# Patient Record
Sex: Female | Born: 1950 | Race: White | Hispanic: No | Marital: Married | Smoking: Former smoker
Health system: Southern US, Community
[De-identification: ages and names within clinical notes are randomized; demographics above are authoritative.]

## PROBLEM LIST (undated history)

## (undated) DIAGNOSIS — Z923 Personal history of irradiation: Secondary | ICD-10-CM

## (undated) DIAGNOSIS — E039 Hypothyroidism, unspecified: Secondary | ICD-10-CM

## (undated) DIAGNOSIS — C801 Malignant (primary) neoplasm, unspecified: Secondary | ICD-10-CM

## (undated) DIAGNOSIS — Z889 Allergy status to unspecified drugs, medicaments and biological substances status: Secondary | ICD-10-CM

## (undated) DIAGNOSIS — K219 Gastro-esophageal reflux disease without esophagitis: Secondary | ICD-10-CM

## (undated) HISTORY — DX: Personal history of irradiation: Z92.3

## (undated) HISTORY — PX: OTHER SURGICAL HISTORY: SHX169

## (undated) HISTORY — PX: COLONOSCOPY: SHX174

---

## 1998-03-14 ENCOUNTER — Other Ambulatory Visit: Admission: RE | Admit: 1998-03-14 | Discharge: 1998-03-14 | Payer: Self-pay | Admitting: Obstetrics and Gynecology

## 1999-10-27 ENCOUNTER — Other Ambulatory Visit: Admission: RE | Admit: 1999-10-27 | Discharge: 1999-10-27 | Payer: Self-pay | Admitting: Obstetrics and Gynecology

## 2000-10-27 ENCOUNTER — Other Ambulatory Visit: Admission: RE | Admit: 2000-10-27 | Discharge: 2000-10-27 | Payer: Self-pay | Admitting: Obstetrics and Gynecology

## 2000-10-31 ENCOUNTER — Encounter (INDEPENDENT_AMBULATORY_CARE_PROVIDER_SITE_OTHER): Payer: Self-pay

## 2000-10-31 ENCOUNTER — Other Ambulatory Visit: Admission: RE | Admit: 2000-10-31 | Discharge: 2000-10-31 | Payer: Self-pay | Admitting: Obstetrics and Gynecology

## 2001-11-14 ENCOUNTER — Other Ambulatory Visit: Admission: RE | Admit: 2001-11-14 | Discharge: 2001-11-14 | Payer: Self-pay | Admitting: Obstetrics and Gynecology

## 2001-11-27 ENCOUNTER — Encounter: Payer: Self-pay | Admitting: Obstetrics and Gynecology

## 2001-11-27 ENCOUNTER — Encounter: Admission: RE | Admit: 2001-11-27 | Discharge: 2001-11-27 | Payer: Self-pay | Admitting: Obstetrics and Gynecology

## 2002-11-26 ENCOUNTER — Other Ambulatory Visit: Admission: RE | Admit: 2002-11-26 | Discharge: 2002-11-26 | Payer: Self-pay | Admitting: Obstetrics and Gynecology

## 2003-02-19 ENCOUNTER — Encounter: Admission: RE | Admit: 2003-02-19 | Discharge: 2003-02-19 | Payer: Self-pay | Admitting: Obstetrics and Gynecology

## 2003-02-19 ENCOUNTER — Encounter: Payer: Self-pay | Admitting: Obstetrics and Gynecology

## 2004-04-13 ENCOUNTER — Encounter: Admission: RE | Admit: 2004-04-13 | Discharge: 2004-04-13 | Payer: Self-pay | Admitting: Pediatrics

## 2004-07-07 ENCOUNTER — Ambulatory Visit: Payer: Self-pay | Admitting: Gastroenterology

## 2004-07-10 ENCOUNTER — Ambulatory Visit: Payer: Self-pay | Admitting: Internal Medicine

## 2004-07-14 ENCOUNTER — Ambulatory Visit (HOSPITAL_COMMUNITY): Admission: RE | Admit: 2004-07-14 | Discharge: 2004-07-14 | Payer: Self-pay | Admitting: Internal Medicine

## 2004-09-22 ENCOUNTER — Ambulatory Visit: Payer: Self-pay | Admitting: Internal Medicine

## 2005-01-01 ENCOUNTER — Ambulatory Visit (HOSPITAL_COMMUNITY): Admission: RE | Admit: 2005-01-01 | Discharge: 2005-01-01 | Payer: Self-pay | Admitting: Pediatrics

## 2005-04-13 ENCOUNTER — Ambulatory Visit: Payer: Self-pay | Admitting: Internal Medicine

## 2005-05-11 ENCOUNTER — Encounter: Admission: RE | Admit: 2005-05-11 | Discharge: 2005-05-11 | Payer: Self-pay | Admitting: Obstetrics and Gynecology

## 2006-05-25 ENCOUNTER — Ambulatory Visit: Payer: Self-pay | Admitting: Internal Medicine

## 2006-07-19 ENCOUNTER — Encounter: Admission: RE | Admit: 2006-07-19 | Discharge: 2006-07-19 | Payer: Self-pay | Admitting: Pediatrics

## 2007-07-21 ENCOUNTER — Encounter: Admission: RE | Admit: 2007-07-21 | Discharge: 2007-07-21 | Payer: Self-pay | Admitting: Obstetrics and Gynecology

## 2009-05-13 ENCOUNTER — Encounter: Admission: RE | Admit: 2009-05-13 | Discharge: 2009-05-13 | Payer: Self-pay | Admitting: Obstetrics and Gynecology

## 2010-08-05 ENCOUNTER — Encounter: Admission: RE | Admit: 2010-08-05 | Discharge: 2010-08-05 | Payer: Self-pay | Admitting: Obstetrics and Gynecology

## 2010-09-27 ENCOUNTER — Encounter: Payer: Self-pay | Admitting: Obstetrics and Gynecology

## 2011-01-22 NOTE — Consult Note (Signed)
[name], [name]               ACCOUNT NO.:  [id number]   MEDICAL RECORD NO.:  [id number]          PATIENT TYPE:   LOCATION:                                 FACILITY:   PHYSICIAN:  Lionel December, M.D.    DATE OF BIRTH:  11-03-1950   DATE OF CONSULTATION:  07/07/2004  DATE OF DISCHARGE:                                   CONSULTATION   REFERRING PHYSICIAN:  Dr. Milford Cage.   REASON FOR CONSULTATION:  Abdominal swelling, dysphagia.   HISTORY OF PRESENT ILLNESS:  [name] Black is a 60 year old lady who presents today  with multiple GI complaints.  She actually was to be triaged for a  colonoscopy back in May 2005;  however, she did not call back in order to  schedule this.  She says that she actually wanted to have an upper endoscopy  done at the same time;  therefore, decided to come in to be seen prior to  scheduling any endoscopies.  She has had multiple GI complaints for most of  her life.  She says at age 27, she was diagnosed with IBS and was prescribed  Donnatal to take before meals.  Symptoms have gradually worsened over the  last few years.  She notes that in her epigastric region she has abdominal  discomfort.  She has a burning-type sensation as well.  When she swallows  solid foods or large pills she feels like they become lodged in her lower  esophageal region.  She has pain upon swallowing hot liquids.  She describes  burning in her esophagus at times, at least two to three times weekly.  These symptoms all seem to be exacerbated at times due to stress.  Denies  any nausea or vomiting.  She also describes abdominal cramping in her lower  abdomen in both the right and left lower quadrants.  She feels like she gets  gas trapped in her colon.  She has relief of these symptoms when she is able  to expel gas or have a bowel movement.  These symptoms are often exacerbated  by eating.  She generally has a bowel movement once daily, but if she misses  one day she starts having these  symptoms.  She often passes small hard  balls.  Denies any melena or rectal bleeding.  She generally has these type  of symptoms once weekly.  Approximately once a month she has severe lower  abdominal pain with excessive gas.  She takes Citrucel approximately once  weekly when she begins getting constipated.  Once a month she takes Senokot  S which cleans her out and she feels better.  She also takes Levsin on a  p.r.n. basis for abdominal bloating and cramps.  This seems to help.  Denies  any melena or rectal bleeding.  She has been treated for possible  diverticulitis in December 2004.  This was based on symptoms.   Earlier this year, she had negative stool culture, C. diff, O&P.  She had a  few WBC's.  This was back in May 2005.   CURRENT MEDICATIONS:  1.  Synthroid  0.1 mg daily.  2.  Multivitamin daily.  3.  Calcium daily.  4.  Vitamin E and vitamin C daily.  5.  Tums p.r.n.  6.  Levsin one sublingual q.4-6h. p.r.n.  7.  Flexeril 10 mg t.i.d. p.r.n.  8.  Oral contraceptive daily.   ALLERGIES:  No known drug allergies.   PAST MEDICAL HISTORY:  1.  IBS.  2.  History of lymphocytic thyroiditis with hypothyroidism, followed by Dr.      Patrecia Pace.  She states that her Synthroid dose has not been changed in      several years.  3.  Bilateral TMJ.  4.  History of hepatitis as a child, and describes it as being food borne,      possibly hepatitis A.   PAST SURGICAL HISTORY:  1.  Uterine fibroids removed along with incidental appendectomy.  2.  Right ovarian cyst removed.  3.  Bilateral knee arthroscopy.   FAMILY HISTORY:  Mother has type 2 diabetes mellitus and history of breast  cancer and MI.  Father died of stroke, had hypertension and dysphagia.  She  has a cousin whose son has Crohn's disease.  Two maternal aunts had colon  cancer in their 39s.   SOCIAL HISTORY:  She is married and has one son.  She is unemployed.  She is  a nonsmoker.  Quit in 1986.  She consumes two to  three glasses of wine or  beer weekly.   REVIEW OF SYSTEMS:  Please see HPI for GI.  GENERAL:  Denies any weight  loss.  CARDIOPULMONARY:  Denies any chest pain or shortness of breath.   PHYSICAL EXAMINATION:  VITAL SIGNS:  Weight 160-1/2, temperature 99.1, blood  pressure 126/90, pulse 84.  GENERAL:  A pleasant, well-developed, well-nourished, Caucasian female in no  acute distress.  SKIN:  Warm and dry, no jaundice.  HEENT:  Pupils equal, round, reactive to light.  Conjunctivae are pink.  Sclerae nonicteric.  Oropharyngeal mucosa moist and pink.  No lesions,  erythema, or exudate.  NECK:  No lymphadenopathy or thyromegaly.  CHEST:  Lungs are clear to auscultation.  CARDIAC:  Regular rate and rhythm.  Normal S1 and S2, no murmurs, rubs, or  gallops.  ABDOMEN:  Positive bowel sounds, soft, nondistended.  She has mild  tenderness with palpation over the xiphoid process.  No rebound tenderness  or guarding, no hepatosplenomegaly or masses.  In the lower abdomen she has  a mild tenderness to deep palpation.  EXTREMITIES:  No edema.   IMPRESSION:  [name] Black is a 60 year old lady who has several chronic  gastrointestinal complaints.  She complains of upper abdominal pain which  she describes as a burning type quality often occurs with eating as well as  brought on by stress.  She describes as being unable to wear tight-fitting  clothing or even a bra because of the pain in this region.  Upon  examination, she has tenderness with palpation of the xiphoid process and  may have inflammation present.  She also describes typical reflux symptoms  along with dysphagia to solid foods and large pills.  Given the  constellation of symptoms, I recommend upper endoscopy.  She may have  an  esophageal stricture, esophagitis;  however, I doubt peptic ulcer disease.  In addition, she complains of abdominal bloating, lower abdominal pain associated with constipation.  I suspect this is due to irritable  bowel  syndrome.  She has never had a colonoscopy and recommend one at this  time.   PLAN:  1.  Colonoscopy and esophagogastroduodenoscopy plus or minus esophageal      dilatation in the near future.  2.  She will continue to use Tums on a p.r.n. basis until our endoscopy.      She may ultimately need to go on a daily proton pump inhibitor.  3.  She will continue Levsin on a p.r.n. basis.  4.  Citrucel one tablespoon daily.  5.  If her upper endoscopy is unremarkable, would consider a trial of      ibuprofen 400 mg t.i.d. for two weeks to see if this helps tenderness      related to her xiphoid process.     Lesl   LL/MEDQ  D:  07/07/2004  T:  07/07/2004  Job:  [id number]   cc:   Dr. Milford Cage   Day Surgery at [facility]

## 2011-01-22 NOTE — Op Note (Signed)
NAMEJAZMAN, REUTER               ACCOUNT NO.:  [id number]   MEDICAL RECORD NO.:  [id number]          PATIENT TYPE:  AMB   LOCATION:  DAY                           FACILITY:  [facility]   PHYSICIAN:  Lionel December, M.D.    DATE OF BIRTH:  [birth year]   DATE OF PROCEDURE:  07/14/2004  DATE OF DISCHARGE:                                 OPERATIVE REPORT   PROCEDURE:  Esophagogastroduodenoscopy with esophageal dilation followed  total colonoscopy.   INDICATIONS:  [name] Black is a 60 year old Caucasian female who has had intermittent  solid food dysphagia dating back to age 89. She also complains of  intermittent heartburn, at least twice a week for which she was using OTC  antacids. She also gives his of IBS. She has lower abdominal pain,  intermittent diarrhea. At times she gets constipated and then she has  difficulty with her bowel movements. She has been treated twice for  diverticulitis, but this has been a clinical diagnosis. She is also 60 years  old and therefore needs to undergo colonoscopy also for screening purposes,  and therefore, she has more than 1 reason to undergo colonoscopy.   Both of the procedures and risks were reviewed with the patient and informed  consent was obtained.   PREOPERATIVE MEDICATIONS:  Cetacaine spray for pharyngeal topical  anesthesia, Demerol 50 mg IV, Versed 14 IV mg in divided doses.   FINDINGS:  Procedure performed in endoscopy suite. The patient's vital signs  and O2 saturations were monitored during the procedure and remained stable.   PROCEDURE #1:  Esophagogastroduodenoscopy. The patient was placed in left  lateral position, and Olympus video scope was passed via oropharynx without  any difficulty into the esophagus.   Esophagus:  Mucosa of the esophagus was normal. A single erosion and  noncritical ring was noted at GE junction. This was at 38 cm from the  incisors.   Stomach:  It was empty and distended very well with insufflation. Folds of  the  proximal stomach were normal. Examination of mucosa at gastric body,  antrum, pyloric channel, as well as angularis, fundus, and cardia were  normal.   Duodenum:  Examination of the bulb and post bulbar duodenum was normal.  Endoscope was withdrawn.   Esophagus was dilated by passing 56-French Maloney dilator to full  insertion. As the dilator was withdrawn, endoscope was passed again, and  esophageal mucosa reexamined. There was a tiny mucosal tear or disruption at  GE junction. Pictures taken for the record, and endoscope was withdrawn, and  the patient prepared for procedure #2.   PROCEDURE #2. Colonoscopy. Rectal examination performed. No abnormality  noted on external or digital exam. Olympus video scope was placed in the  rectum and advanced into sigmoid colon which was very tortuous. Slowly and  carefully, scope was passed further. Using abdominal pressure and different  position, I was able to advance the scope into the cecum which was  identified by appendiceal stump and ileocecal valve. Picture taken for the  record. Preparation was felt to be satisfactory. She has liquid stool here  and there which  was easily suctioned out. As the scope was withdrawn,  colonic mucosa was once again carefully examined and revealed normal  vascular pattern throughout. There were no diverticula noted in any segment.  Rectal mucosa was also normal. The scope was retroflexed to examine  anorectal junction which was unremarkable. The scope was straightened and  withdrawn. The patient tolerated the procedure well.   FINAL DIAGNOSES:  1.  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      Esophagus dilated by passing 56-French Maloney dilator disrupting this      ring.  2.  Normal exam of the stomach, first and second part of the duodenum.  3.  Normal colonoscopy. No diverticula were seen on today's exam. Please      note that tiny diverticula could be missed, though.    RECOMMENDATIONS:  1.  She will continue anti-reflex measures. Will start on Aciphex 20 mg p.o.      q.a.m.  2.  High fiber diet. She will continue Citrucel at 1 tablespoonful daily.  3.  Levbid 1/2 to 1 tablet every morning.  4.  She can take Colace 2 tablets at bedtime if she feels she is      constipated. The patient advised not to take any over-the-counter      laxatives. She will keep stool diary and return for office visit in 8      weeks from now.     Naje   NR/MEDQ  D:  07/14/2004  T:  07/14/2004  Job:  [id number]   cc:   Francoise Schaumann. Halm, D.O.  [address]  [place]  [place] [address]  Fax: [phone]

## 2011-08-03 ENCOUNTER — Other Ambulatory Visit: Payer: Self-pay | Admitting: Pediatrics

## 2011-08-03 DIAGNOSIS — Z1231 Encounter for screening mammogram for malignant neoplasm of breast: Secondary | ICD-10-CM

## 2011-08-10 ENCOUNTER — Ambulatory Visit
Admission: RE | Admit: 2011-08-10 | Discharge: 2011-08-10 | Disposition: A | Discharge status: Home / Self Care | Payer: BC Managed Care – PPO | Source: Ambulatory Visit | Attending: Pediatrics | Admitting: Pediatrics

## 2011-08-10 DIAGNOSIS — Z1231 Encounter for screening mammogram for malignant neoplasm of breast: Secondary | ICD-10-CM

## 2012-06-28 ENCOUNTER — Other Ambulatory Visit (INDEPENDENT_AMBULATORY_CARE_PROVIDER_SITE_OTHER): Payer: Self-pay | Admitting: Internal Medicine

## 2012-07-25 ENCOUNTER — Other Ambulatory Visit: Payer: Self-pay | Admitting: Internal Medicine

## 2012-07-25 DIAGNOSIS — Z1231 Encounter for screening mammogram for malignant neoplasm of breast: Secondary | ICD-10-CM

## 2012-09-21 ENCOUNTER — Ambulatory Visit
Admission: RE | Admit: 2012-09-21 | Discharge: 2012-09-21 | Disposition: A | Discharge status: Home / Self Care | Payer: BC Managed Care – PPO | Source: Ambulatory Visit | Attending: Internal Medicine | Admitting: Internal Medicine

## 2012-09-21 DIAGNOSIS — Z1231 Encounter for screening mammogram for malignant neoplasm of breast: Secondary | ICD-10-CM

## 2012-12-27 ENCOUNTER — Other Ambulatory Visit (INDEPENDENT_AMBULATORY_CARE_PROVIDER_SITE_OTHER): Payer: Self-pay | Admitting: Internal Medicine

## 2013-06-27 ENCOUNTER — Other Ambulatory Visit (INDEPENDENT_AMBULATORY_CARE_PROVIDER_SITE_OTHER): Payer: Self-pay | Admitting: Internal Medicine

## 2013-08-21 ENCOUNTER — Other Ambulatory Visit: Payer: Self-pay

## 2013-08-21 DIAGNOSIS — Z1231 Encounter for screening mammogram for malignant neoplasm of breast: Secondary | ICD-10-CM

## 2013-09-25 ENCOUNTER — Other Ambulatory Visit: Payer: Self-pay

## 2013-09-25 ENCOUNTER — Ambulatory Visit
Admission: RE | Admit: 2013-09-25 | Discharge: 2013-09-25 | Disposition: A | Discharge status: Home / Self Care | Payer: Self-pay | Source: Ambulatory Visit

## 2013-09-25 DIAGNOSIS — Z1231 Encounter for screening mammogram for malignant neoplasm of breast: Secondary | ICD-10-CM

## 2013-11-07 ENCOUNTER — Encounter (INDEPENDENT_AMBULATORY_CARE_PROVIDER_SITE_OTHER): Payer: Self-pay | Admitting: *Deleted

## 2013-11-15 ENCOUNTER — Other Ambulatory Visit (INDEPENDENT_AMBULATORY_CARE_PROVIDER_SITE_OTHER): Payer: Self-pay | Admitting: *Deleted

## 2013-11-15 DIAGNOSIS — Z1211 Encounter for screening for malignant neoplasm of colon: Secondary | ICD-10-CM

## 2013-11-16 ENCOUNTER — Telehealth (INDEPENDENT_AMBULATORY_CARE_PROVIDER_SITE_OTHER): Payer: Self-pay | Admitting: *Deleted

## 2013-11-16 DIAGNOSIS — Z1211 Encounter for screening for malignant neoplasm of colon: Secondary | ICD-10-CM

## 2013-11-16 NOTE — Telephone Encounter (Signed)
Patient needs movi prep 

## 2013-11-19 ENCOUNTER — Encounter (INDEPENDENT_AMBULATORY_CARE_PROVIDER_SITE_OTHER): Payer: Self-pay | Admitting: *Deleted

## 2013-11-19 MED ORDER — PEG-KCL-NACL-NASULF-NA ASC-C 100 G PO SOLR: 1.0000 | Freq: Once | ORAL | Status: DC

## 2013-12-24 ENCOUNTER — Telehealth (INDEPENDENT_AMBULATORY_CARE_PROVIDER_SITE_OTHER): Payer: Self-pay | Admitting: *Deleted

## 2013-12-24 NOTE — Telephone Encounter (Signed)
  Procedure: tcs  Reason/Indication:  screening  Has patient had this procedure before?  Yes, 11 years ago  If so, when, by whom and where?    Is there a family history of colon cancer?  no  Who?  What age when diagnosed?    Is patient diabetic?   no      Does patient have prosthetic heart valve?  no  Do you have a pacemaker?  no  Has patient ever had endocarditis? no  Has patient had joint replacement within last 12 months?  no  Does patient tend to be constipated or take laxatives? no  Is patient on Coumadin, Plavix and/or Aspirin? no  Medications: levothyroxine 112 mcg daily, omeprazole 20 mg daily  Allergies: nkda  Medication Adjustment:   Procedure date & time: 01/09/14

## 2013-12-24 NOTE — Telephone Encounter (Signed)
agree

## 2013-12-26 ENCOUNTER — Encounter (HOSPITAL_COMMUNITY): Payer: Self-pay

## 2014-01-09 ENCOUNTER — Encounter (HOSPITAL_COMMUNITY): Payer: Self-pay | Admitting: *Deleted

## 2014-01-09 ENCOUNTER — Ambulatory Visit (HOSPITAL_COMMUNITY)
Admission: RE | Admit: 2014-01-09 | Discharge: 2014-01-09 | Disposition: A | Discharge status: Home / Self Care | Payer: BC Managed Care – PPO | Source: Ambulatory Visit | Attending: Internal Medicine | Admitting: Internal Medicine

## 2014-01-09 ENCOUNTER — Encounter (HOSPITAL_COMMUNITY)
Admission: RE | Disposition: A | Discharge status: Home / Self Care | Payer: Self-pay | Source: Ambulatory Visit | Attending: Internal Medicine

## 2014-01-09 DIAGNOSIS — Z1211 Encounter for screening for malignant neoplasm of colon: Secondary | ICD-10-CM | POA: Insufficient documentation

## 2014-01-09 DIAGNOSIS — K5730 Diverticulosis of large intestine without perforation or abscess without bleeding: Secondary | ICD-10-CM | POA: Insufficient documentation

## 2014-01-09 DIAGNOSIS — Q2733 Arteriovenous malformation of digestive system vessel: Secondary | ICD-10-CM

## 2014-01-09 DIAGNOSIS — Z87891 Personal history of nicotine dependence: Secondary | ICD-10-CM | POA: Insufficient documentation

## 2014-01-09 DIAGNOSIS — K644 Residual hemorrhoidal skin tags: Secondary | ICD-10-CM | POA: Insufficient documentation

## 2014-01-09 HISTORY — PX: COLONOSCOPY: SHX5424

## 2014-01-09 HISTORY — DX: Allergy status to unspecified drugs, medicaments and biological substances: Z88.9

## 2014-01-09 HISTORY — DX: Gastro-esophageal reflux disease without esophagitis: K21.9

## 2014-01-09 HISTORY — DX: Hypothyroidism, unspecified: E03.9

## 2014-01-09 SURGERY — COLONOSCOPY
Anesthesia: Moderate Sedation

## 2014-01-09 MED ORDER — STERILE WATER FOR IRRIGATION IR SOLN
Status: DC | PRN
  Administered 2014-01-09: 11:00:00

## 2014-01-09 MED ORDER — MIDAZOLAM HCL 5 MG/5ML IJ SOLN
INTRAMUSCULAR | Status: AC
  Filled 2014-01-09: qty 10

## 2014-01-09 MED ORDER — MEPERIDINE HCL 50 MG/ML IJ SOLN
INTRAMUSCULAR | Status: AC
  Filled 2014-01-09: qty 1

## 2014-01-09 MED ORDER — SODIUM CHLORIDE 0.9 % IV SOLN
INTRAVENOUS | Status: DC
  Administered 2014-01-09: 10:00:00 via INTRAVENOUS

## 2014-01-09 MED ORDER — MIDAZOLAM HCL 5 MG/5ML IJ SOLN
INTRAMUSCULAR | Status: DC | PRN
  Administered 2014-01-09 (×2): 2 mg via INTRAVENOUS
  Administered 2014-01-09: 3 mg via INTRAVENOUS
  Administered 2014-01-09: 2 mg via INTRAVENOUS

## 2014-01-09 MED ORDER — MEPERIDINE HCL 50 MG/ML IJ SOLN
INTRAMUSCULAR | Status: DC | PRN
  Administered 2014-01-09 (×2): 25 mg via INTRAVENOUS

## 2014-01-09 NOTE — Op Note (Addendum)
COLONOSCOPY PROCEDURE REPORT  PATIENT:  [name] Black  MR#:  [id number] Birthdate:  01-12-1951, 63 y.o., female Endoscopist:  Dr. Rogene Houston, MD Referred By:  Dr. Wende Neighbors, MD Procedure Date: 01/09/2014  Procedure:   Colonoscopy  Indications:  Patient is 63 year old Caucasian female is undergoing average risk screening colonoscopy.  Informed Consent:  The procedure and risks were reviewed with the patient and informed consent was obtained.  Medications:  Demerol 50 mg IV Versed 9 mg IV  Description of procedure:  After a digital rectal exam was performed, that colonoscope was advanced from the anus through the rectum and colon to the area of the cecum, ileocecal valve and appendiceal orifice. The cecum was deeply intubated. These structures were well-seen and photographed for the record. From the level of the cecum and ileocecal valve, the scope was slowly and cautiously withdrawn. The mucosal surfaces were carefully surveyed utilizing scope tip to flexion to facilitate fold flattening as needed. The scope was pulled down into the rectum where a thorough exam including retroflexion was performed. Procedure was begun with pediatric colonoscope but was changed to slim scope in order to overcome tight turn in sigmoid colon.  Findings:   Prep excellent. Few small diverticula in sigmoid colon. Normal rectal mucosa. Small hemorrhoids below the dentate line.    Therapeutic/Diagnostic Maneuvers Performed:  None  Complications:  None  Cecal Withdrawal Time:  10 minutes  Impression:  Examination performed to cecum. Mild sigmoid colon diverticulosis. 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 Small external hemorrhoids.  Recommendations:  Standard instructions given. High fiber diet. Next screening exam in 10 years.  Rogene Houston  01/09/2014 11:42 AM  CC: Dr. Delphina Cahill, MD & Dr. Rayne Du ref. provider found

## 2014-01-09 NOTE — Discharge Instructions (Addendum)
Resume usual medications and high fiber diet. No driving for 24 hours. Next screening exam in 10 years.   High-Fiber Diet Fiber is found in fruits, vegetables, and grains. A high-fiber diet encourages the addition of more whole grains, legumes, fruits, and vegetables in your diet. The recommended amount of fiber for adult males is 38 g per day. For adult females, it is 25 g per day. Pregnant and lactating women should get 28 g of fiber per day. If you have a digestive or bowel problem, ask your caregiver for advice before adding high-fiber foods to your diet. Eat a variety of high-fiber foods instead of only a select few type of foods.  PURPOSE  To increase stool bulk.  To make bowel movements more regular to prevent constipation.  To lower cholesterol.  To prevent overeating. WHEN IS THIS DIET USED?  It may be used if you have constipation and hemorrhoids.  It may be used if you have uncomplicated diverticulosis (intestine condition) and irritable bowel syndrome.  It may be used if you need help with weight management.  It may be used if you want to add it to your diet as a protective measure against atherosclerosis, diabetes, and cancer. SOURCES OF FIBER  Whole-grain breads and cereals.  Fruits, such as apples, oranges, bananas, berries, prunes, and pears.  Vegetables, such as green peas, carrots, sweet potatoes, beets, broccoli, cabbage, spinach, and artichokes.  Legumes, such split peas, soy, lentils.  Almonds. FIBER CONTENT IN FOODS Starches and Grains / Dietary Fiber (g)  Cheerios, 1 cup / 3 g  Corn Flakes cereal, 1 cup / 0.7 g  Rice crispy treat cereal, 1 cup / 0.3 g  Instant oatmeal (cooked),  cup / 2 g  Frosted wheat cereal, 1 cup / 5.1 g  Brown, long-grain rice (cooked), 1 cup / 3.5 g  White, long-grain rice (cooked), 1 cup / 0.6 g  Enriched macaroni (cooked), 1 cup / 2.5 g Legumes / Dietary Fiber (g)  Baked beans (canned, plain, or vegetarian),   cup / 5.2 g  Kidney beans (canned),  cup / 6.8 g  Pinto beans (cooked),  cup / 5.5 g Breads and Crackers / Dietary Fiber (g)  Plain or honey graham crackers, 2 squares / 0.7 g  Saltine crackers, 3 squares / 0.3 g  Plain, salted pretzels, 10 pieces / 1.8 g  Whole-wheat bread, 1 slice / 1.9 g  White bread, 1 slice / 0.7 g  Raisin bread, 1 slice / 1.2 g  Plain bagel, 3 oz / 2 g  Flour tortilla, 1 oz / 0.9 g  Corn tortilla, 1 small / 1.5 g  Hamburger or hotdog bun, 1 small / 0.9 g Fruits / Dietary Fiber (g)  Apple with skin, 1 medium / 4.4 g  Sweetened applesauce,  cup / 1.5 g  Banana,  medium / 1.5 g  Grapes, 10 grapes / 0.4 g  Orange, 1 small / 2.3 g  Raisin, 1.5 oz / 1.6 g  Melon, 1 cup / 1.4 g Vegetables / Dietary Fiber (g)  Green beans (canned),  cup / 1.3 g  Carrots (cooked),  cup / 2.3 g  Broccoli (cooked),  cup / 2.8 g  Peas (cooked),  cup / 4.4 g  Mashed potatoes,  cup / 1.6 g  Lettuce, 1 cup / 0.5 g  Corn (canned),  cup / 1.6 g  Tomato,  cup / 1.1 g Document Released: 08/23/2005 Document Revised: 02/22/2012 Document Reviewed: 11/25/2011 [employer] Patient  Information 2014 [place], [place]. Colonoscopy, Care After Refer to this sheet in the next few weeks. These instructions provide you with information on caring for yourself after your procedure. Your health care provider may also give you more specific instructions. Your treatment has been planned according to current medical practices, but problems sometimes occur. Call your health care provider if you have any problems or questions after your procedure. WHAT TO EXPECT AFTER THE PROCEDURE  After your procedure, it is typical to have the following:  A small amount of blood in your stool.  Moderate amounts of gas and mild abdominal cramping or bloating. HOME CARE INSTRUCTIONS  Do not drive, operate machinery, or sign important documents for 24 hours.  You may shower and resume your  regular physical activities, but move at a slower pace for the first 24 hours.  Take frequent rest periods for the first 24 hours.  Walk around or put a warm pack on your abdomen to help reduce abdominal cramping and bloating.  Drink enough fluids to keep your urine clear or pale yellow.  You may resume your normal diet as instructed by your health care provider. Avoid heavy or fried foods that are hard to digest.  Avoid drinking alcohol for 24 hours or as instructed by your health care provider.  Only take over-the-counter or prescription medicines as directed by your health care provider.  If a tissue sample (biopsy) was taken during your procedure:  Do not take aspirin or blood thinners for 7 days, or as instructed by your health care provider.  Do not drink alcohol for 7 days, or as instructed by your health care provider.  Eat soft foods for the first 24 hours. SEEK MEDICAL CARE IF: You have persistent spotting of blood in your stool 2 3 days after the procedure. SEEK IMMEDIATE MEDICAL CARE IF:  You have more than a small spotting of blood in your stool.  You pass large blood clots in your stool.  Your abdomen is swollen (distended).  You have nausea or vomiting.  You have a fever.  You have increasing abdominal pain that is not relieved with medicine. Document Released: 04/06/2004 Document Revised: 06/13/2013 Document Reviewed: 04/30/2013 [facility] Patient Information 2014 [place]. Diverticulosis Diverticulosis is a common condition that develops when small pouches (diverticula) form in the wall of the colon. The risk of diverticulosis increases with age. It happens more often in people who eat a low-fiber diet. Most individuals with diverticulosis have no symptoms. Those individuals with symptoms usually experience abdominal pain, constipation, or loose stools (diarrhea). HOME CARE INSTRUCTIONS   Increase the amount of fiber in your diet as directed by your  caregiver or dietician. This may reduce symptoms of diverticulosis.  Your caregiver may recommend taking a dietary fiber supplement.  Drink at least 6 to 8 glasses of water each day to prevent constipation.  Try not to strain when you have a bowel movement.  Your caregiver may recommend avoiding nuts and seeds to prevent complications, although this is still an uncertain benefit.  Only take over-the-counter or prescription medicines for pain, discomfort, or fever as directed by your caregiver. FOODS WITH HIGH FIBER CONTENT INCLUDE:  Fruits. Apple, peach, pear, tangerine, raisins, prunes.  Vegetables. Brussels sprouts, asparagus, broccoli, cabbage, carrot, cauliflower, romaine lettuce, spinach, summer squash, tomato, winter squash, zucchini.  Starchy Vegetables. Baked beans, kidney beans, lima beans, split peas, lentils, potatoes (with skin).  Grains. Whole wheat bread, brown rice, bran flake cereal, plain oatmeal, white rice, shredded  wheat, bran muffins. SEEK IMMEDIATE MEDICAL CARE IF:   You develop increasing pain or severe bloating.  You have an oral temperature above 102 F (38.9 C), not controlled by medicine.  You develop vomiting or bowel movements that are bloody or black. Document Released: 05/20/2004 Document Revised: 11/15/2011 Document Reviewed: 01/21/2010 [facility] Patient Information 2014 [place].

## 2014-01-09 NOTE — H&P (Signed)
[name] Black is an 63 y.o. female.   Chief Complaint: Patient is here for colonoscopy. HPI: This 63 year old Caucasian female who is screening colonoscopy. She denies abdominal pain rectal bleeding or change in bowel habits. Her last colonoscopy was in 2005. Family history is negative for CRC.  Past Medical History  Diagnosis Date  . Hypothyroidism   . H/O seasonal allergies   . GERD (gastroesophageal reflux disease)     Past Surgical History  Procedure Laterality Date  . Colonoscopy    . Right knee arthroscopy    . Fibroid removed and cyst removed from ovary    . Cyst removed from left knee      Family History  Problem Relation Age of Onset  . Colon cancer Neg Hx    Social History:  reports that she has quit smoking. Her smoking use included Cigarettes. She has a 5 pack-year smoking history. She does not have any smokeless tobacco history on file. She reports that she drinks about 4.2 ounces of alcohol per week. She reports that she does not use illicit drugs.  Allergies: No Known Allergies  Medications Prior to Admission  Medication Sig Dispense Refill  . b complex vitamins tablet Take 1 tablet by mouth daily.      Nyoka Cowden Tea, Camillia sinensis, (GREEN TEA PO) Take 1 capsule by mouth daily.      Marland Kitchen levothyroxine (SYNTHROID, LEVOTHROID) 112 MCG tablet Take 112 mcg by mouth daily before breakfast.      . Multiple Vitamins-Minerals (MULTIVITAMINS THER. W/MINERALS) TABS tablet Take 1 tablet by mouth daily.      Marland Kitchen omeprazole (PRILOSEC) 20 MG capsule TAKE ONE (1) CAPSULE EACH DAY  30 capsule  5  . Probiotic Product (PROBIOTIC PO) Take 1 capsule by mouth daily.      Marland Kitchen ibuprofen (ADVIL,MOTRIN) 200 MG tablet Take 200-400 mg by mouth daily as needed for mild pain.      Marland Kitchen loratadine (CLARITIN) 10 MG tablet Take 10 mg by mouth daily as needed for allergies.        No results found for this or any previous visit (from the past 48 hour(s)). No results found.  ROS  Blood pressure  150/86, pulse 75, temperature 97.9 F (36.6 C), temperature source Oral, resp. rate 11, height 5' 6.5" (1.689 m), weight 158 lb (71.668 kg), SpO2 97.00%. Physical Exam  Constitutional: She appears well-developed and well-nourished.  HENT:  Mouth/Throat: Oropharynx is clear and moist.  Eyes: Conjunctivae are normal. No scleral icterus.  Neck: No thyromegaly present.  Cardiovascular: Normal rate, regular rhythm and normal heart sounds.   No murmur heard. Respiratory: Effort normal and breath sounds normal.  GI: Soft. She exhibits no distension and no mass. There is no tenderness.  Musculoskeletal: She exhibits no edema.  Lymphadenopathy:    She has no cervical adenopathy.  Neurological: She is alert.  Skin: Skin is warm and dry.     Assessment/Plan Average risk screening colonoscopy.  [name] Black U [name] Black 01/09/2014, 10:56 AM

## 2014-01-10 ENCOUNTER — Encounter (HOSPITAL_COMMUNITY): Payer: Self-pay | Admitting: Internal Medicine

## 2014-06-24 ENCOUNTER — Telehealth (INDEPENDENT_AMBULATORY_CARE_PROVIDER_SITE_OTHER): Payer: Self-pay | Admitting: *Deleted

## 2014-06-24 ENCOUNTER — Other Ambulatory Visit (INDEPENDENT_AMBULATORY_CARE_PROVIDER_SITE_OTHER): Payer: Self-pay | Admitting: Internal Medicine

## 2014-06-24 ENCOUNTER — Other Ambulatory Visit (INDEPENDENT_AMBULATORY_CARE_PROVIDER_SITE_OTHER): Payer: Self-pay | Admitting: *Deleted

## 2014-06-24 DIAGNOSIS — R1010 Upper abdominal pain, unspecified: Secondary | ICD-10-CM

## 2014-06-24 MED ORDER — HYOSCYAMINE SULFATE 0.125 MG SL SUBL: 0.1250 mg | SUBLINGUAL_TABLET | Freq: Four times a day (QID) | SUBLINGUAL | Status: DC | PRN

## 2014-06-24 NOTE — Telephone Encounter (Signed)
Patient states the following. She has been having Upper Right Quadrant Pain for several months. Thursday of last week she started having burning,GI tract. States that it was horrible. Patient also experiences nausea. Thursday through Sunday she experienced this along with URQ pain , and experienced white BM's  Sunday evening she did begin to feel better.Stools have returned to normal color today.  She denies eating fried foods or Red meats. She did drink more red wine that normal on last Thursday after a very stressful day.  Patient is very concerned, when she was advised that medication had been called in she expressed that she did not was to mask anything going on. She is concerned about Gallbladder/Celiac Disease /Liver Cancer.  This was further discussed with Dr.Rehman , he ask that the patient be brought in on Terri's Schedule this week.

## 2014-06-24 NOTE — Telephone Encounter (Signed)
Per Dr.Rehman the patient may try Levsin 0.125 mg Take 1 by mouth four times daily , PRN. #20 If patient continues to have problems she will need a office visit.

## 2014-06-24 NOTE — Telephone Encounter (Signed)
Per Dr.Rehman patient may try the Levsin.If symptoms persist patient will need to have OV with Terri or him.

## 2014-06-24 NOTE — Telephone Encounter (Signed)
Patient to be called. Prescription was sent to [facility].

## 2014-06-24 NOTE — Telephone Encounter (Signed)
[name] Black started having: Friday-burning in her stomach with abd paim in upper right side; Saturday-the same with sold white bowel movements; Sunday-same with white bowel movements; Today same pain and burning with no bowel movement at this time. Her return phone number is [phone].

## 2014-06-24 NOTE — Telephone Encounter (Signed)
Apt has been scheduled for 06/25/14 with Deberah Castle, NP.

## 2014-06-25 ENCOUNTER — Encounter (INDEPENDENT_AMBULATORY_CARE_PROVIDER_SITE_OTHER): Payer: Self-pay | Admitting: Internal Medicine

## 2014-06-25 ENCOUNTER — Ambulatory Visit (INDEPENDENT_AMBULATORY_CARE_PROVIDER_SITE_OTHER): Payer: BC Managed Care – PPO | Admitting: Internal Medicine

## 2014-06-25 ENCOUNTER — Encounter (INDEPENDENT_AMBULATORY_CARE_PROVIDER_SITE_OTHER): Payer: Self-pay | Admitting: *Deleted

## 2014-06-25 VITALS — BP 126/84 | HR 72 | Temp 98.0°F | Ht 66.5 in | Wt 159.3 lb

## 2014-06-25 DIAGNOSIS — G8929 Other chronic pain: Secondary | ICD-10-CM

## 2014-06-25 DIAGNOSIS — R1011 Right upper quadrant pain: Principal | ICD-10-CM

## 2014-06-25 DIAGNOSIS — E039 Hypothyroidism, unspecified: Secondary | ICD-10-CM | POA: Insufficient documentation

## 2014-06-25 DIAGNOSIS — R1010 Upper abdominal pain, unspecified: Secondary | ICD-10-CM

## 2014-06-25 NOTE — Patient Instructions (Signed)
CBC, CMET, US abdomen

## 2014-06-25 NOTE — Progress Notes (Addendum)
Subjective:    Patient ID: [name] Black, female    DOB: [birth year], 63 y.o.   MRN: [id number]  HPI Presents today with  C/o RUQ pain off and on for months. This episode started last Friday and has not stopped.  Friday morning she had abdominal burning and pain RUQ. She rated the pain at a 6. The pain now is a 2.   No burning in her epigastric area now.  Belching associated with her symptoms.  Not related to eating.  On Thursday before she had the pain she had 3 glasses of wine, but no fried foods. When she presses on her rt upper quadrant it is tender. She tells me on Saturday and Sunday she had a white stool. Appetite is good. No weight loss. No acid reflux. Controlled with Prilosec. Usually ha a BM x 1 a day.  She takes Fiber every night before bedtime and 4 prunes. She says she does not have a hx of constipation. If she eats a large meal she will become constipated and she will have to take Senna. Hx of Hepatitis as a child.  01/09/2014 Colonoscopy Average screening, Dr. Laural Golden: Impression:  Examination performed to cecum.  Mild sigmoid colon diverticulosis.  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  Small external hemorrhoids.  07/14/2004 EGD/ED, Colonoscopy: 1. 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  Esophagus dilated by passing 56-French Maloney dilator disrupting this  ring.  2. Normal exam of the stomach, first and second part of the duodenum.  3. Normal colonoscopy. No diverticula were seen on today's exam. Please  note that tiny diverticula could be missed, though    Review of Systems Past Medical History  Diagnosis Date  . Hypothyroidism   . H/O seasonal allergies   . GERD (gastroesophageal reflux disease)     Past Surgical History  Procedure Laterality Date  . Colonoscopy    . Right knee arthroscopy    . Fibroid removed and cyst removed from ovary    . Cyst removed from left knee    . Colonoscopy N/A 01/09/2014      Procedure: COLONOSCOPY;  Surgeon: Rogene Houston, MD;  Location: [facility];  Service: Endoscopy;  Laterality: N/A;  1030    No Known Allergies  Current Outpatient Prescriptions on File Prior to Visit  Medication Sig Dispense Refill  . b complex vitamins tablet Take 1 tablet by mouth daily.      Nyoka Cowden Tea, Camillia sinensis, (GREEN TEA PO) Take 1 capsule by mouth daily.      Marland Kitchen ibuprofen (ADVIL,MOTRIN) 200 MG tablet Take 200-400 mg by mouth daily as needed for mild pain.      Marland Kitchen levothyroxine (SYNTHROID, LEVOTHROID) 112 MCG tablet Take 112 mcg by mouth daily before breakfast.      . Multiple Vitamins-Minerals (MULTIVITAMINS THER. W/MINERALS) TABS tablet Take 1 tablet by mouth daily.      Marland Kitchen omeprazole (PRILOSEC) 20 MG capsule TAKE ONE (1) CAPSULE EACH DAY  30 capsule  5  . Probiotic Product (PROBIOTIC PO) Take 1 capsule by mouth daily.      . hyoscyamine (LEVSIN SL) 0.125 MG SL tablet Place 1 tablet (0.125 mg total) under the tongue 4 (four) times daily as needed for cramping.  20 tablet  0  . loratadine (CLARITIN) 10 MG tablet Take 10 mg by mouth daily as needed for allergies.       No current facility-administered medications on file prior to  visit.        Objective:   Physical Exam  Filed Vitals:   06/25/14 1043  BP: 126/84  Pulse: 72  Temp: 98 F (36.7 C)  Height: 5' 6.5" (1.689 m)  Weight: 159 lb 4.8 oz (72.258 kg)   Alert and oriented. Skin warm and dry. Oral mucosa is moist.   . Sclera anicteric, conjunctivae is pink. Thyroid not enlarged. No cervical lymphadenopathy. Lungs clear. Heart regular rate and rhythm.  Abdomen is soft. Bowel sounds are positive. No hepatomegaly. No abdominal masses felt. No tenderness.  No edema to lower extremities.          Assessment & Plan:  RUQ pain. GB disease needs to be ruled out.

## 2014-06-26 LAB — CBC WITH DIFFERENTIAL/PLATELET
Basophils Absolute: 0 10*3/uL (ref 0.0–0.1)
Basophils Relative: 0 % (ref 0–1)
EOS PCT: 1 % (ref 0–5)
Eosinophils Absolute: 0.1 10*3/uL (ref 0.0–0.7)
HEMATOCRIT: 44.5 % (ref 36.0–46.0)
HEMOGLOBIN: 14.9 g/dL (ref 12.0–15.0)
LYMPHS PCT: 28 % (ref 12–46)
Lymphs Abs: 2 10*3/uL (ref 0.7–4.0)
MCH: 31.3 pg (ref 26.0–34.0)
MCHC: 33.5 g/dL (ref 30.0–36.0)
MCV: 93.5 fL (ref 78.0–100.0)
MONO ABS: 0.4 10*3/uL (ref 0.1–1.0)
MONOS PCT: 6 % (ref 3–12)
Neutro Abs: 4.6 10*3/uL (ref 1.7–7.7)
Neutrophils Relative %: 65 % (ref 43–77)
Platelets: 301 10*3/uL (ref 150–400)
RBC: 4.76 MIL/uL (ref 3.87–5.11)
RDW: 13.5 % (ref 11.5–15.5)
WBC: 7 10*3/uL (ref 4.0–10.5)

## 2014-06-26 LAB — COMPREHENSIVE METABOLIC PANEL
ALT: 23 U/L (ref 0–35)
AST: 17 U/L (ref 0–37)
Albumin: 4.4 g/dL (ref 3.5–5.2)
Alkaline Phosphatase: 74 U/L (ref 39–117)
BUN: 13 mg/dL (ref 6–23)
CO2: 20 meq/L (ref 19–32)
CREATININE: 0.83 mg/dL (ref 0.50–1.10)
Calcium: 9.7 mg/dL (ref 8.4–10.5)
Chloride: 104 mEq/L (ref 96–112)
Glucose, Bld: 87 mg/dL (ref 70–99)
Potassium: 4.9 mEq/L (ref 3.5–5.3)
Sodium: 140 mEq/L (ref 135–145)
Total Bilirubin: 0.5 mg/dL (ref 0.2–1.2)
Total Protein: 7.2 g/dL (ref 6.0–8.3)

## 2014-06-27 ENCOUNTER — Ambulatory Visit (HOSPITAL_COMMUNITY)
Admission: RE | Admit: 2014-06-27 | Discharge: 2014-06-27 | Disposition: A | Discharge status: Home / Self Care | Payer: BC Managed Care – PPO | Source: Ambulatory Visit | Attending: Internal Medicine | Admitting: Internal Medicine

## 2014-06-27 ENCOUNTER — Telehealth (INDEPENDENT_AMBULATORY_CARE_PROVIDER_SITE_OTHER): Payer: Self-pay | Admitting: Internal Medicine

## 2014-06-27 DIAGNOSIS — R1011 Right upper quadrant pain: Secondary | ICD-10-CM

## 2014-06-27 DIAGNOSIS — R110 Nausea: Secondary | ICD-10-CM | POA: Insufficient documentation

## 2014-06-27 DIAGNOSIS — G8929 Other chronic pain: Secondary | ICD-10-CM

## 2014-06-27 NOTE — Telephone Encounter (Signed)
HIDA scan will be scheduled.

## 2014-06-28 NOTE — Telephone Encounter (Signed)
HIDA scan sch'd 07/02/14 at 800 (745 am). Npo after midnight, no pain meds, left detailed message for patient

## 2014-07-02 ENCOUNTER — Other Ambulatory Visit (HOSPITAL_COMMUNITY): Payer: BC Managed Care – PPO

## 2014-07-10 ENCOUNTER — Encounter (HOSPITAL_COMMUNITY)
Admission: RE | Admit: 2014-07-10 | Discharge: 2014-07-10 | Disposition: A | Discharge status: Home / Self Care | Payer: BC Managed Care – PPO | Source: Ambulatory Visit | Attending: Diagnostic Radiology | Admitting: Diagnostic Radiology

## 2014-07-10 ENCOUNTER — Encounter (HOSPITAL_COMMUNITY): Payer: Self-pay

## 2014-07-10 DIAGNOSIS — R110 Nausea: Secondary | ICD-10-CM | POA: Diagnosis not present

## 2014-07-10 DIAGNOSIS — R1011 Right upper quadrant pain: Secondary | ICD-10-CM | POA: Insufficient documentation

## 2014-07-10 MED ORDER — SINCALIDE 5 MCG IJ SOLR
INTRAMUSCULAR | Status: AC
  Administered 2014-07-10: 1.41 ug via INTRAVENOUS
  Filled 2014-07-10: qty 5

## 2014-07-10 MED ORDER — SODIUM CHLORIDE 0.9 % IJ SOLN
INTRAMUSCULAR | Status: AC
  Filled 2014-07-10: qty 12

## 2014-07-10 MED ORDER — TECHNETIUM TC 99M MEBROFENIN IV KIT
5.0000 | PACK | Freq: Once | INTRAVENOUS | Status: AC | PRN
  Administered 2014-07-10: 5 via INTRAVENOUS

## 2014-07-10 MED ORDER — STERILE WATER FOR INJECTION IJ SOLN
INTRAMUSCULAR | Status: AC
  Administered 2014-07-10: 1.41 mL via INTRAVENOUS
  Filled 2014-07-10: qty 10

## 2014-08-28 ENCOUNTER — Other Ambulatory Visit: Payer: Self-pay

## 2014-08-28 DIAGNOSIS — Z1231 Encounter for screening mammogram for malignant neoplasm of breast: Secondary | ICD-10-CM

## 2014-10-16 ENCOUNTER — Ambulatory Visit: Payer: BC Managed Care – PPO

## 2014-10-22 ENCOUNTER — Ambulatory Visit
Admission: RE | Admit: 2014-10-22 | Discharge: 2014-10-22 | Disposition: A | Discharge status: Home / Self Care | Payer: BLUE CROSS/BLUE SHIELD | Source: Ambulatory Visit

## 2014-10-22 DIAGNOSIS — Z1231 Encounter for screening mammogram for malignant neoplasm of breast: Secondary | ICD-10-CM

## 2015-07-11 ENCOUNTER — Encounter: Payer: Self-pay | Admitting: "Endocrinology

## 2015-07-11 ENCOUNTER — Ambulatory Visit (INDEPENDENT_AMBULATORY_CARE_PROVIDER_SITE_OTHER): Payer: BLUE CROSS/BLUE SHIELD | Admitting: "Endocrinology

## 2015-07-11 VITALS — BP 143/72 | HR 91 | Ht 66.0 in | Wt 162.0 lb

## 2015-07-11 DIAGNOSIS — R739 Hyperglycemia, unspecified: Secondary | ICD-10-CM

## 2015-07-11 DIAGNOSIS — E039 Hypothyroidism, unspecified: Secondary | ICD-10-CM

## 2015-07-11 DIAGNOSIS — R7303 Prediabetes: Secondary | ICD-10-CM | POA: Insufficient documentation

## 2015-07-11 MED ORDER — LEVOTHYROXINE SODIUM 112 MCG PO TABS: 112.0000 ug | ORAL_TABLET | Freq: Every day | ORAL | Status: DC

## 2015-07-12 NOTE — Progress Notes (Signed)
Subjective:    Patient ID: [name] Black, female    DOB: July 30, 1951,    Past Medical History  Diagnosis Date  . Hypothyroidism   . H/O seasonal allergies   . GERD (gastroesophageal reflux disease)    Past Surgical History  Procedure Laterality Date  . Colonoscopy    . Right knee arthroscopy    . Fibroid removed and cyst removed from ovary    . Cyst removed from left knee    . Colonoscopy N/A 01/09/2014    Procedure: COLONOSCOPY;  Surgeon: Rogene Houston, MD;  Location: [facility];  Service: Endoscopy;  Laterality: N/A;  1030   Social History   Social History  . Marital Status: Married    Spouse Name: N/A  . Number of Children: N/A  . Years of Education: N/A   Social History Main Topics  . Smoking status: Former Smoker -- 0.50 packs/day for 10 years    Types: Cigarettes  . Smokeless tobacco: None  . Alcohol Use: 4.2 oz/week    7 Glasses of wine per week     Comment: 1-2 glasses of wine a night  . Drug Use: No  . Sexual Activity: Not Asked   Other Topics Concern  . None   Social History Narrative   Outpatient Encounter Prescriptions as of 07/11/2015  Medication Sig  . b complex vitamins tablet Take 1 tablet by mouth daily.  . calcium citrate-vitamin D (CITRACAL+D) 315-200 MG-UNIT per tablet Take 1 tablet by mouth 2 (two) times daily.  Nyoka Cowden Tea, Camillia sinensis, (GREEN TEA PO) Take 1 capsule by mouth daily.  Marland Kitchen levothyroxine (SYNTHROID, LEVOTHROID) 112 MCG tablet Take 1 tablet (112 mcg total) by mouth daily before breakfast.  . Multiple Vitamins-Minerals (MULTIVITAMINS THER. W/MINERALS) TABS tablet Take 1 tablet by mouth daily.  . Omega-3 Fatty Acids (FISH OIL) 1000 MG CAPS Take by mouth.  Marland Kitchen omeprazole (PRILOSEC) 20 MG capsule TAKE ONE (1) CAPSULE EACH DAY  . Probiotic Product (PROBIOTIC PO) Take 1 capsule by mouth daily.  . [DISCONTINUED] levothyroxine (SYNTHROID, LEVOTHROID) 112 MCG tablet Take 112 mcg by mouth daily before breakfast.  . Green Tea 150  MG CAPS Take by mouth.  . hyoscyamine (LEVSIN SL) 0.125 MG SL tablet Place 1 tablet (0.125 mg total) under the tongue 4 (four) times daily as needed for cramping.  Marland Kitchen ibuprofen (ADVIL,MOTRIN) 200 MG tablet Take 200-400 mg by mouth daily as needed for mild pain.  Marland Kitchen loratadine (CLARITIN) 10 MG tablet Take 10 mg by mouth daily as needed for allergies.   No facility-administered encounter medications on file as of 07/11/2015.   ALLERGIES: No Known Allergies VACCINATION STATUS:  There is no immunization history on file for this patient.  HPI 64 yr old female with long-standing primary hypothyroidism on levothyroxine 112 g by mouth every morning. She is here for f/u. She feels better. No cold /heat intolerance.  She has a steady weight. No new complaints.  Review of Systems  Constitutional: no weight gain/loss, no fatigue, no subjective hyperthermia/hypothermia Eyes: no blurry vision, no xerophthalmia ENT: no sore throat, no nodules palpated in throat, no dysphagia/odynophagia, no hoarseness Cardiovascular: no CP/SOB/palpitations/leg swelling Respiratory: no cough/SOB Gastrointestinal: no N/V/D/C Musculoskeletal: no muscle/joint aches Skin: no rashes Neurological: no tremors/numbness/tingling/dizziness Psychiatric: no depression/anxiety  Objective:    BP 143/72 mmHg  Pulse 91  Ht 5' 6"  (1.676 m)  Wt 162 lb (73.483 kg)  BMI 26.16 kg/m2  SpO2 97%  Wt Readings from Last  3 Encounters:  07/11/15 162 lb (73.483 kg)  06/25/14 159 lb 4.8 oz (72.258 kg)  01/09/14 158 lb (71.668 kg)    Physical Exam  Constitutional: in NAD Eyes: PERRLA, EOMI, no exophthalmos ENT: moist mucous membranes, no thyromegaly, no cervical lymphadenopathy Cardiovascular: RRR, No MRG Respiratory: CTA B Gastrointestinal: abdomen soft, NT, ND, BS+ Musculoskeletal: no deformities, strength intact in all 4 Skin: moist, warm, no rashes Neurological: no tremor with outstretched hands, DTR normal in all  4  Results for orders placed or performed in visit on 06/25/14  Comp Met (CMET)  Result Value Ref Range   Sodium 140 135 - 145 mEq/L   Potassium 4.9 3.5 - 5.3 mEq/L   Chloride 104 96 - 112 mEq/L   CO2 20 19 - 32 mEq/L   Glucose, Bld 87 70 - 99 mg/dL   BUN 13 6 - 23 mg/dL   Creat 0.83 0.50 - 1.10 mg/dL   Total Bilirubin 0.5 0.2 - 1.2 mg/dL   Alkaline Phosphatase 74 39 - 117 U/L   AST 17 0 - 37 U/L   ALT 23 0 - 35 U/L   Total Protein 7.2 6.0 - 8.3 g/dL   Albumin 4.4 3.5 - 5.2 g/dL   Calcium 9.7 8.4 - 10.5 mg/dL  CBC with Differential  Result Value Ref Range   WBC 7.0 4.0 - 10.5 K/uL   RBC 4.76 3.87 - 5.11 MIL/uL   Hemoglobin 14.9 12.0 - 15.0 g/dL   HCT 44.5 36.0 - 46.0 %   MCV 93.5 78.0 - 100.0 fL   MCH 31.3 26.0 - 34.0 pg   MCHC 33.5 30.0 - 36.0 g/dL   RDW 13.5 11.5 - 15.5 %   Platelets 301 150 - 400 K/uL   Neutrophils Relative % 65 43 - 77 %   Neutro Abs 4.6 1.7 - 7.7 K/uL   Lymphocytes Relative 28 12 - 46 %   Lymphs Abs 2.0 0.7 - 4.0 K/uL   Monocytes Relative 6 3 - 12 %   Monocytes Absolute 0.4 0.1 - 1.0 K/uL   Eosinophils Relative 1 0 - 5 %   Eosinophils Absolute 0.1 0.0 - 0.7 K/uL   Basophils Relative 0 0 - 1 %   Basophils Absolute 0.0 0.0 - 0.1 K/uL   Smear Review Criteria for review not met    Complete Blood Count (Most recent): Lab Results  Component Value Date   WBC 7.0 06/25/2014   HGB 14.9 06/25/2014   HCT 44.5 06/25/2014   MCV 93.5 06/25/2014   PLT 301 06/25/2014   Chemistry (most recent): Lab Results  Component Value Date   NA 140 06/25/2014   K 4.9 06/25/2014   CL 104 06/25/2014   CO2 20 06/25/2014   BUN 13 06/25/2014   CREATININE 0.83 06/25/2014    Assessment & Plan:   1. Hypothyroidism, unspecified hypothyroidism type she is doing well on Levothyroxine 112 mcg po qam. Will continue same.    - We discussed about correct intake of levothyroxine, at fasting, with water, separated by at least 30 minutes from breakfast, and separated by more  than 4 hours from calcium, iron, multivitamins, acid reflux medications (PPIs). -Patient is made aware of the fact that thyroid hormone replacement is needed for life, dose to be adjusted by periodic monitoring of thyroid function tests. She had subcentimeter nodules will need follow-up ultrasound.  2. Blood glucose elevated Her A1c is better at 5.7% .she is not on any medications. She will continue on  diet and exercise regimen only..  I advised patient to maintain close follow up with their PCP for primary care needs, will return in 6 months with repeat thyroid function test for follow-up.  Follow up plan: Return for underactive thyroid.  Glade Lloyd, MD Phone: [phone]  Fax: [phone]   07/12/2015, 8:24 AM

## 2015-12-11 ENCOUNTER — Other Ambulatory Visit: Payer: Self-pay | Admitting: Internal Medicine

## 2015-12-11 ENCOUNTER — Other Ambulatory Visit: Payer: Self-pay

## 2015-12-11 DIAGNOSIS — Z1231 Encounter for screening mammogram for malignant neoplasm of breast: Secondary | ICD-10-CM

## 2015-12-23 ENCOUNTER — Other Ambulatory Visit: Payer: Self-pay | Admitting: "Endocrinology

## 2015-12-23 LAB — TSH: TSH: 2.06 mIU/L

## 2015-12-23 LAB — T4, FREE: Free T4: 1.2 ng/dL (ref 0.8–1.8)

## 2015-12-30 ENCOUNTER — Encounter: Payer: Self-pay | Admitting: "Endocrinology

## 2015-12-30 ENCOUNTER — Ambulatory Visit (INDEPENDENT_AMBULATORY_CARE_PROVIDER_SITE_OTHER): Payer: BLUE CROSS/BLUE SHIELD | Admitting: "Endocrinology

## 2015-12-30 VITALS — BP 135/86 | HR 82 | Ht 66.0 in | Wt 161.0 lb

## 2015-12-30 DIAGNOSIS — E039 Hypothyroidism, unspecified: Secondary | ICD-10-CM | POA: Diagnosis not present

## 2015-12-30 DIAGNOSIS — R7303 Prediabetes: Secondary | ICD-10-CM | POA: Diagnosis not present

## 2015-12-30 MED ORDER — LEVOTHYROXINE SODIUM 112 MCG PO TABS: 112.0000 ug | ORAL_TABLET | Freq: Every day | ORAL | Status: DC

## 2015-12-30 NOTE — Progress Notes (Signed)
Subjective:    Patient ID: [name] Black, female    DOB: 1951/06/30,    Past Medical History  Diagnosis Date  . Hypothyroidism   . H/O seasonal allergies   . GERD (gastroesophageal reflux disease)    Past Surgical History  Procedure Laterality Date  . Colonoscopy    . Right knee arthroscopy    . Fibroid removed and cyst removed from ovary    . Cyst removed from left knee    . Colonoscopy N/A 01/09/2014    Procedure: COLONOSCOPY;  Surgeon: Rogene Houston, MD;  Location: [facility];  Service: Endoscopy;  Laterality: N/A;  1030   Social History   Social History  . Marital Status: Married    Spouse Name: N/A  . Number of Children: N/A  . Years of Education: N/A   Social History Main Topics  . Smoking status: Former Smoker -- 0.50 packs/day for 10 years    Types: Cigarettes  . Smokeless tobacco: None  . Alcohol Use: 4.2 oz/week    7 Glasses of wine per week     Comment: 1-2 glasses of wine a night  . Drug Use: No  . Sexual Activity: Not Asked   Other Topics Concern  . None   Social History Narrative   Outpatient Encounter Prescriptions as of 12/30/2015  Medication Sig  . b complex vitamins tablet Take 1 tablet by mouth daily.  . calcium citrate-vitamin D (CITRACAL+D) 315-200 MG-UNIT per tablet Take 1 tablet by mouth 2 (two) times daily.  Nyoka Cowden Tea 150 MG CAPS Take by mouth.  Marland Kitchen ibuprofen (ADVIL,MOTRIN) 200 MG tablet Take 200-400 mg by mouth daily as needed for mild pain.  Marland Kitchen levothyroxine (SYNTHROID, LEVOTHROID) 112 MCG tablet Take 1 tablet (112 mcg total) by mouth daily before breakfast.  . loratadine (CLARITIN) 10 MG tablet Take 10 mg by mouth daily as needed for allergies.  . Multiple Vitamins-Minerals (MULTIVITAMINS THER. W/MINERALS) TABS tablet Take 1 tablet by mouth daily.  . Omega-3 Fatty Acids (FISH OIL) 1000 MG CAPS Take by mouth.  Marland Kitchen omeprazole (PRILOSEC) 20 MG capsule TAKE ONE (1) CAPSULE EACH DAY  . Probiotic Product (PROBIOTIC PO) Take 1 capsule by  mouth daily.  . [DISCONTINUED] Green Tea, Camillia sinensis, (GREEN TEA PO) Take 1 capsule by mouth daily.  . [DISCONTINUED] hyoscyamine (LEVSIN SL) 0.125 MG SL tablet Place 1 tablet (0.125 mg total) under the tongue 4 (four) times daily as needed for cramping.  . [DISCONTINUED] levothyroxine (SYNTHROID, LEVOTHROID) 112 MCG tablet Take 1 tablet (112 mcg total) by mouth daily before breakfast.   No facility-administered encounter medications on file as of 12/30/2015.   ALLERGIES: No Known Allergies VACCINATION STATUS:  There is no immunization history on file for this patient.  HPI 65 yr old female with long-standing primary hypothyroidism on levothyroxine 112 g by mouth every morning. She is here for f/u. She feels better. No cold /heat intolerance.  She has a steady weight. No new complaints.  Review of Systems  Constitutional: no weight gain/loss, no fatigue, no subjective hyperthermia/hypothermia Eyes: no blurry vision, no xerophthalmia ENT: no sore throat, no nodules palpated in throat, no dysphagia/odynophagia, no hoarseness Cardiovascular: no CP/SOB/palpitations/leg swelling Respiratory: no cough/SOB Gastrointestinal: no N/V/D/C Musculoskeletal: no muscle/joint aches Skin: no rashes Neurological: no tremors/numbness/tingling/dizziness Psychiatric: no depression/anxiety  Objective:    BP 135/86 mmHg  Pulse 82  Ht 5\' 6"  (1.676 m)  Wt 161 lb (73.029 kg)  BMI 26.00 kg/m2  SpO2 96%  Wt Readings from Last 3 Encounters:  12/30/15 161 lb (73.029 kg)  07/11/15 162 lb (73.483 kg)  06/25/14 159 lb 4.8 oz (72.258 kg)    Physical Exam  Constitutional: in NAD Eyes: PERRLA, EOMI, no exophthalmos ENT: moist mucous membranes, no thyromegaly, no cervical lymphadenopathy Cardiovascular: RRR, No MRG Respiratory: CTA B Gastrointestinal: abdomen soft, NT, ND, BS+ Musculoskeletal: no deformities, strength intact in all 4 Skin: moist, warm, no rashes Neurological: no tremor with  outstretched hands, DTR normal in all 4  Results for orders placed or performed in visit on 12/23/15  TSH  Result Value Ref Range   TSH 2.06 mIU/L  T4, free  Result Value Ref Range   Free T4 1.2 0.8 - 1.8 ng/dL   Complete Blood Count (Most recent): Lab Results  Component Value Date   WBC 7.0 06/25/2014   HGB 14.9 06/25/2014   HCT 44.5 06/25/2014   MCV 93.5 06/25/2014   PLT 301 06/25/2014   Chemistry (most recent): Lab Results  Component Value Date   NA 140 06/25/2014   K 4.9 06/25/2014   CL 104 06/25/2014   CO2 20 06/25/2014   BUN 13 06/25/2014   CREATININE 0.83 06/25/2014    Assessment & Plan:   1. Hypothyroidism, unspecified hypothyroidism type she is doing well on Levothyroxine 112 mcg po qam. Will continue same.    - We discussed about correct intake of levothyroxine, at fasting, with water, separated by at least 30 minutes from breakfast, and separated by more than 4 hours from calcium, iron, multivitamins, acid reflux medications (PPIs). -Patient is made aware of the fact that thyroid hormone replacement is needed for life, dose to be adjusted by periodic monitoring of thyroid function tests. She had subcentimeter nodules will need follow-up ultrasound.  2. Blood glucose elevated Her A1c is better at 5.6% .she is not on any medications. She will continue on diet and exercise regimen only..  I advised patient to maintain close follow up with their PCP for primary care needs, will return in 6 months with repeat thyroid function test for follow-up.  Follow up plan: Return in about 6 months (around 06/30/2016) for underactive thyroid, prediabetes.  Glade Lloyd, MD Phone: [phone]  Fax: [phone]   12/30/2015, 12:02 PM

## 2016-01-06 ENCOUNTER — Encounter: Payer: Self-pay | Admitting: "Endocrinology

## 2016-01-06 ENCOUNTER — Ambulatory Visit
Admission: RE | Admit: 2016-01-06 | Discharge: 2016-01-06 | Disposition: A | Discharge status: Home / Self Care | Payer: BLUE CROSS/BLUE SHIELD | Source: Ambulatory Visit | Attending: Internal Medicine | Admitting: Internal Medicine

## 2016-01-06 DIAGNOSIS — Z1231 Encounter for screening mammogram for malignant neoplasm of breast: Secondary | ICD-10-CM

## 2016-01-07 ENCOUNTER — Ambulatory Visit: Payer: BLUE CROSS/BLUE SHIELD | Admitting: "Endocrinology

## 2016-06-17 ENCOUNTER — Other Ambulatory Visit: Payer: Self-pay | Admitting: "Endocrinology

## 2016-06-18 LAB — T4, FREE: Free T4: 1.4 ng/dL (ref 0.8–1.8)

## 2016-06-18 LAB — TSH: TSH: 3.06 mIU/L

## 2016-06-24 ENCOUNTER — Encounter: Payer: Self-pay | Admitting: "Endocrinology

## 2016-06-24 ENCOUNTER — Ambulatory Visit (INDEPENDENT_AMBULATORY_CARE_PROVIDER_SITE_OTHER): Payer: BLUE CROSS/BLUE SHIELD | Admitting: "Endocrinology

## 2016-06-24 VITALS — BP 138/88 | HR 81 | Ht 66.0 in | Wt 162.0 lb

## 2016-06-24 DIAGNOSIS — E038 Other specified hypothyroidism: Secondary | ICD-10-CM

## 2016-06-24 DIAGNOSIS — R7303 Prediabetes: Secondary | ICD-10-CM

## 2016-06-24 NOTE — Progress Notes (Signed)
Subjective:    Patient ID: [name] Black, female    DOB: 1951/08/05,    Past Medical History:  Diagnosis Date  . GERD (gastroesophageal reflux disease)   . H/O seasonal allergies   . Hypothyroidism    Past Surgical History:  Procedure Laterality Date  . COLONOSCOPY    . COLONOSCOPY N/A 01/09/2014   Procedure: COLONOSCOPY;  Surgeon: Rogene Houston, MD;  Location: [facility];  Service: Endoscopy;  Laterality: N/A;  1030  . Cyst removed from left knee    . Fibroid removed and cyst removed from ovary    . Right knee arthroscopy     Social History   Social History  . Marital status: Married    Spouse name: N/A  . Number of children: N/A  . Years of education: N/A   Social History Main Topics  . Smoking status: Former Smoker    Packs/day: 0.50    Years: 10.00    Types: Cigarettes  . Smokeless tobacco: Never Used  . Alcohol use 4.2 oz/week    7 Glasses of wine per week     Comment: 1-2 glasses of wine a night  . Drug use: No  . Sexual activity: Not Asked   Other Topics Concern  . None   Social History Narrative  . None   Outpatient Encounter Prescriptions as of 06/24/2016  Medication Sig  . b complex vitamins tablet Take 1 tablet by mouth daily.  Nyoka Cowden Tea 150 MG CAPS Take by mouth.  . levothyroxine (SYNTHROID, LEVOTHROID) 112 MCG tablet Take 1 tablet (112 mcg total) by mouth daily before breakfast.  . Multiple Vitamins-Minerals (MULTIVITAMINS THER. W/MINERALS) TABS tablet Take 1 tablet by mouth daily.  Marland Kitchen omeprazole (PRILOSEC) 20 MG capsule TAKE ONE (1) CAPSULE EACH DAY  . Probiotic Product (PROBIOTIC PO) Take 1 capsule by mouth daily.  . [DISCONTINUED] calcium citrate-vitamin D (CITRACAL+D) 315-200 MG-UNIT per tablet Take 1 tablet by mouth 2 (two) times daily.  . [DISCONTINUED] ibuprofen (ADVIL,MOTRIN) 200 MG tablet Take 200-400 mg by mouth daily as needed for mild pain.  . [DISCONTINUED] loratadine (CLARITIN) 10 MG tablet Take 10 mg by mouth daily as  needed for allergies.  . [DISCONTINUED] Omega-3 Fatty Acids (FISH OIL) 1000 MG CAPS Take by mouth.   No facility-administered encounter medications on file as of 06/24/2016.    ALLERGIES: No Known Allergies VACCINATION STATUS:  There is no immunization history on file for this patient.  HPI 65 yr old female with long-standing primary hypothyroidism on levothyroxine 112 g by mouth every morning. She is here for f/u. She feels better. No cold /heat intolerance.  She has a steady weight. No new complaints.  Review of Systems  Constitutional: no weight gain/loss, no fatigue, no subjective hyperthermia/hypothermia Eyes: no blurry vision, no xerophthalmia ENT: no sore throat, no nodules palpated in throat, no dysphagia/odynophagia, no hoarseness Cardiovascular: no CP/SOB/palpitations/leg swelling Respiratory: no cough/SOB Gastrointestinal: no N/V/D/C Musculoskeletal: no muscle/joint aches Skin: no rashes Neurological: no tremors/numbness/tingling/dizziness Psychiatric: no depression/anxiety  Objective:    BP 138/88   Pulse 81   Ht 5\' 6"  (1.676 m)   Wt 162 lb (73.5 kg)   BMI 26.15 kg/m   Wt Readings from Last 3 Encounters:  06/24/16 162 lb (73.5 kg)  12/30/15 161 lb (73 kg)  07/11/15 162 lb (73.5 kg)    Physical Exam  Constitutional: in NAD Eyes: PERRLA, EOMI, no exophthalmos ENT: moist mucous membranes, Palpable thyroid, no cervical lymphadenopathy Cardiovascular: RRR,  No MRG Respiratory: CTA B Gastrointestinal: abdomen soft, NT, ND, BS+ Musculoskeletal: no deformities, strength intact in all 4 Skin: moist, warm, no rashes Neurological: no tremor with outstretched hands, DTR normal in all 4  Results for orders placed or performed in visit on 06/17/16  TSH  Result Value Ref Range   TSH 3.06 mIU/L  T4, free  Result Value Ref Range   Free T4 1.4 0.8 - 1.8 ng/dL   Complete Blood Count (Most recent): Lab Results  Component Value Date   WBC 7.0 06/25/2014   HGB  14.9 06/25/2014   HCT 44.5 06/25/2014   MCV 93.5 06/25/2014   PLT 301 06/25/2014   Chemistry (most recent): Lab Results  Component Value Date   NA 140 06/25/2014   K 4.9 06/25/2014   CL 104 06/25/2014   CO2 20 06/25/2014   BUN 13 06/25/2014   CREATININE 0.83 06/25/2014    Assessment & Plan:   1. Hypothyroidism, unspecified hypothyroidism type she is doing well on Levothyroxine 112 mcg po qam. Will continue same.    - We discussed about correct intake of levothyroxine, at fasting, with water, separated by at least 30 minutes from breakfast, and separated by more than 4 hours from calcium, iron, multivitamins, acid reflux medications (PPIs). -Patient is made aware of the fact that thyroid hormone replacement is needed for life, dose to be adjusted by periodic monitoring of thyroid function tests.  She had subcentimeter nodules will need follow-up ultrasound.  2. Blood glucose elevated Her A1c is better at 5.6% .she is not on any medications. She will continue on diet and exercise regimen only..  I advised patient to maintain close follow up with their PCP for primary care needs, will return in 6 months with repeat thyroid function test for follow-up.  Follow up plan: Return in about 6 months (around 12/23/2016) for follow up with pre-visit labs.  Glade Lloyd, MD Phone: [phone]  Fax: [phone]   06/24/2016, 1:23 PM

## 2016-10-05 ENCOUNTER — Other Ambulatory Visit: Payer: Self-pay | Admitting: "Endocrinology

## 2016-11-25 ENCOUNTER — Other Ambulatory Visit: Payer: Self-pay | Admitting: Internal Medicine

## 2016-11-25 DIAGNOSIS — Z1231 Encounter for screening mammogram for malignant neoplasm of breast: Secondary | ICD-10-CM

## 2016-12-14 ENCOUNTER — Ambulatory Visit (HOSPITAL_COMMUNITY): Admission: RE | Admit: 2016-12-14 | Payer: BLUE CROSS/BLUE SHIELD | Source: Ambulatory Visit

## 2016-12-16 ENCOUNTER — Other Ambulatory Visit: Payer: Self-pay | Admitting: "Endocrinology

## 2016-12-16 LAB — T4, FREE: Free T4: 1.1 ng/dL (ref 0.8–1.8)

## 2016-12-16 LAB — TSH: TSH: 2.67 mIU/L

## 2016-12-17 LAB — HEMOGLOBIN A1C
HEMOGLOBIN A1C: 5.4 % (ref <5.7)
Mean Plasma Glucose: 108 mg/dL

## 2016-12-23 ENCOUNTER — Ambulatory Visit (INDEPENDENT_AMBULATORY_CARE_PROVIDER_SITE_OTHER): Payer: BLUE CROSS/BLUE SHIELD | Admitting: "Endocrinology

## 2016-12-23 ENCOUNTER — Encounter: Payer: Self-pay | Admitting: "Endocrinology

## 2016-12-23 VITALS — BP 138/80 | HR 84 | Ht 66.0 in | Wt 156.0 lb

## 2016-12-23 DIAGNOSIS — E039 Hypothyroidism, unspecified: Secondary | ICD-10-CM

## 2016-12-23 DIAGNOSIS — R7303 Prediabetes: Secondary | ICD-10-CM | POA: Diagnosis not present

## 2016-12-23 MED ORDER — LEVOTHYROXINE SODIUM 112 MCG PO TABS: ORAL_TABLET | ORAL | 4 refills | Status: DC

## 2016-12-23 NOTE — Progress Notes (Signed)
Subjective:    Patient ID: [name] Black, female    DOB: 08-28-1951,    Past Medical History:  Diagnosis Date  . GERD (gastroesophageal reflux disease)   . H/O seasonal allergies   . Hypothyroidism    Past Surgical History:  Procedure Laterality Date  . COLONOSCOPY    . COLONOSCOPY N/A 01/09/2014   Procedure: COLONOSCOPY;  Surgeon: Rogene Houston, MD;  Location: [facility];  Service: Endoscopy;  Laterality: N/A;  1030  . Cyst removed from left knee    . Fibroid removed and cyst removed from ovary    . Right knee arthroscopy     Social History   Social History  . Marital status: Married    Spouse name: N/A  . Number of children: N/A  . Years of education: N/A   Social History Main Topics  . Smoking status: Former Smoker    Packs/day: 0.50    Years: 10.00    Types: Cigarettes  . Smokeless tobacco: Never Used  . Alcohol use 4.2 oz/week    7 Glasses of wine per week     Comment: 1-2 glasses of wine a night  . Drug use: No  . Sexual activity: Not Asked   Other Topics Concern  . None   Social History Narrative  . None   Outpatient Encounter Prescriptions as of 12/23/2016  Medication Sig  . b complex vitamins tablet Take 1 tablet by mouth daily.  Nyoka Cowden Tea 150 MG CAPS Take by mouth.  . levothyroxine (SYNTHROID, LEVOTHROID) 112 MCG tablet TAKE ONE (1) TABLET BY MOUTH EVERY DAY BEFORE BREAKFAST  . Multiple Vitamins-Minerals (MULTIVITAMINS THER. W/MINERALS) TABS tablet Take 1 tablet by mouth daily.  Marland Kitchen omeprazole (PRILOSEC) 20 MG capsule TAKE ONE (1) CAPSULE EACH DAY  . Probiotic Product (PROBIOTIC PO) Take 1 capsule by mouth daily.  . [DISCONTINUED] levothyroxine (SYNTHROID, LEVOTHROID) 112 MCG tablet Take 1 tablet (112 mcg total) by mouth daily before breakfast.  . [DISCONTINUED] levothyroxine (SYNTHROID, LEVOTHROID) 112 MCG tablet TAKE ONE (1) TABLET BY MOUTH EVERY DAY BEFORE BREAKFAST   No facility-administered encounter medications on file as of 12/23/2016.     ALLERGIES: No Known Allergies VACCINATION STATUS:  There is no immunization history on file for this patient.  HPI 66 yr old female with long-standing primary hypothyroidism on levothyroxine 112 g by mouth every morning. She is here for f/u. She feels better. No cold /heat intolerance.  She has lost 5 pounds of weight. No new complaints.  Review of Systems  Constitutional: + Inpatient weight loss of 5 pounds, no fatigue, no subjective hyperthermia/hypothermia Eyes: no blurry vision, no xerophthalmia ENT: no sore throat, no nodules palpated in throat, no dysphagia/odynophagia, no hoarseness Cardiovascular: no CP/SOB/palpitations/leg swelling Respiratory: no cough/SOB Gastrointestinal: no N/V/D/C Musculoskeletal: no muscle/joint aches Skin: no rashes Neurological: no tremors/numbness/tingling/dizziness Psychiatric: no depression/anxiety  Objective:    BP 138/80   Pulse 84   Ht 5\' 6"  (1.676 m)   Wt 156 lb (70.8 kg)   BMI 25.18 kg/m   Wt Readings from Last 3 Encounters:  12/23/16 156 lb (70.8 kg)  06/24/16 162 lb (73.5 kg)  12/30/15 161 lb (73 kg)    Physical Exam  Constitutional: in NAD Eyes: PERRLA, EOMI, no exophthalmos ENT: moist mucous membranes, Palpable thyroid, no cervical lymphadenopathy Cardiovascular: RRR, No MRG Respiratory: CTA B Gastrointestinal: abdomen soft, NT, ND, BS+ Musculoskeletal: no deformities, strength intact in all 4 Skin: moist, warm, no rashes Neurological: no tremor  with outstretched hands, DTR normal in all 4  Results for orders placed or performed in visit on 12/16/16  TSH  Result Value Ref Range   TSH 2.67 mIU/L  T4, free  Result Value Ref Range   Free T4 1.1 0.8 - 1.8 ng/dL  Hemoglobin A1c  Result Value Ref Range   Hgb A1c MFr Bld 5.4 <5.7 %   Mean Plasma Glucose 108 mg/dL   Complete Blood Count (Most recent): Lab Results  Component Value Date   WBC 7.0 06/25/2014   HGB 14.9 06/25/2014   HCT 44.5 06/25/2014   MCV  93.5 06/25/2014   PLT 301 06/25/2014   Chemistry (most recent): Lab Results  Component Value Date   NA 140 06/25/2014   K 4.9 06/25/2014   CL 104 06/25/2014   CO2 20 06/25/2014   BUN 13 06/25/2014   CREATININE 0.83 06/25/2014    Assessment & Plan:   1. Hypothyroidism, unspecified hypothyroidism type she is doing well on Levothyroxine 112 mcg po qam. Will continue same.    - We discussed about correct intake of levothyroxine, at fasting, with water, separated by at least 30 minutes from breakfast, and separated by more than 4 hours from calcium, iron, multivitamins, acid reflux medications (PPIs). -Patient is made aware of the fact that thyroid hormone replacement is needed for life, dose to be adjusted by periodic monitoring of thyroid function tests.  She had subcentimeter nodules will need follow-up ultrasound When she gets appropriate insurance coverage. - She will also need postmenopausal screening bone density for osteoporosis.  2. Predibetes  Her A1c is better at 5.4% .she is not on any medications. She will continue on diet and exercise regimen only.  Follow up plan: Return in about 1 year (around 12/23/2017) for follow up with pre-visit labs, Bone Density.  Glade Lloyd, MD Phone: [phone]  Fax: [phone]   12/23/2016, 10:46 AM

## 2017-01-04 DIAGNOSIS — C801 Malignant (primary) neoplasm, unspecified: Secondary | ICD-10-CM

## 2017-01-04 HISTORY — DX: Malignant (primary) neoplasm, unspecified: C80.1

## 2017-01-04 HISTORY — PX: BREAST BIOPSY: SHX20

## 2017-01-11 ENCOUNTER — Ambulatory Visit
Admission: RE | Admit: 2017-01-11 | Discharge: 2017-01-11 | Disposition: A | Discharge status: Home / Self Care | Payer: BLUE CROSS/BLUE SHIELD | Source: Ambulatory Visit | Attending: Internal Medicine | Admitting: Internal Medicine

## 2017-01-11 DIAGNOSIS — Z1231 Encounter for screening mammogram for malignant neoplasm of breast: Secondary | ICD-10-CM

## 2017-01-12 ENCOUNTER — Other Ambulatory Visit: Payer: Self-pay | Admitting: Internal Medicine

## 2017-01-12 DIAGNOSIS — R928 Other abnormal and inconclusive findings on diagnostic imaging of breast: Secondary | ICD-10-CM

## 2017-01-17 ENCOUNTER — Other Ambulatory Visit: Payer: Self-pay | Admitting: Internal Medicine

## 2017-01-17 ENCOUNTER — Ambulatory Visit
Admission: RE | Admit: 2017-01-17 | Discharge: 2017-01-17 | Disposition: A | Discharge status: Home / Self Care | Payer: BLUE CROSS/BLUE SHIELD | Source: Ambulatory Visit | Attending: Internal Medicine | Admitting: Internal Medicine

## 2017-01-17 DIAGNOSIS — R928 Other abnormal and inconclusive findings on diagnostic imaging of breast: Secondary | ICD-10-CM

## 2017-01-17 DIAGNOSIS — N6310 Unspecified lump in the right breast, unspecified quadrant: Secondary | ICD-10-CM

## 2017-01-19 ENCOUNTER — Other Ambulatory Visit: Payer: Self-pay | Admitting: Internal Medicine

## 2017-01-19 ENCOUNTER — Ambulatory Visit
Admission: RE | Admit: 2017-01-19 | Discharge: 2017-01-19 | Disposition: A | Discharge status: Home / Self Care | Payer: BLUE CROSS/BLUE SHIELD | Source: Ambulatory Visit | Attending: Internal Medicine | Admitting: Internal Medicine

## 2017-01-19 DIAGNOSIS — N6310 Unspecified lump in the right breast, unspecified quadrant: Secondary | ICD-10-CM

## 2017-01-20 ENCOUNTER — Telehealth: Payer: Self-pay | Admitting: *Deleted

## 2017-01-20 NOTE — Telephone Encounter (Signed)
Confirmed [facility] for 01/26/17 at 1215pm. .  Instructions and contact information given.

## 2017-01-25 ENCOUNTER — Other Ambulatory Visit: Payer: Self-pay | Admitting: *Deleted

## 2017-01-25 DIAGNOSIS — Z170 Estrogen receptor positive status [ER+]: Principal | ICD-10-CM

## 2017-01-25 DIAGNOSIS — C50411 Malignant neoplasm of upper-outer quadrant of right female breast: Secondary | ICD-10-CM | POA: Insufficient documentation

## 2017-01-26 ENCOUNTER — Ambulatory Visit
Admission: RE | Admit: 2017-01-26 | Discharge: 2017-01-26 | Disposition: A | Discharge status: Home / Self Care | Payer: BLUE CROSS/BLUE SHIELD | Source: Ambulatory Visit | Attending: Radiation Oncology | Admitting: Radiation Oncology

## 2017-01-26 ENCOUNTER — Ambulatory Visit: Payer: BLUE CROSS/BLUE SHIELD | Attending: General Surgery | Admitting: Physical Therapy

## 2017-01-26 ENCOUNTER — Other Ambulatory Visit (HOSPITAL_BASED_OUTPATIENT_CLINIC_OR_DEPARTMENT_OTHER): Payer: BLUE CROSS/BLUE SHIELD

## 2017-01-26 ENCOUNTER — Encounter: Payer: Self-pay | Admitting: Physical Therapy

## 2017-01-26 ENCOUNTER — Encounter: Payer: Self-pay | Admitting: Radiation Oncology

## 2017-01-26 ENCOUNTER — Ambulatory Visit (HOSPITAL_BASED_OUTPATIENT_CLINIC_OR_DEPARTMENT_OTHER): Payer: BLUE CROSS/BLUE SHIELD | Admitting: Hematology and Oncology

## 2017-01-26 ENCOUNTER — Encounter: Payer: Self-pay | Admitting: Hematology and Oncology

## 2017-01-26 DIAGNOSIS — Z79899 Other long term (current) drug therapy: Secondary | ICD-10-CM | POA: Insufficient documentation

## 2017-01-26 DIAGNOSIS — Z809 Family history of malignant neoplasm, unspecified: Secondary | ICD-10-CM | POA: Insufficient documentation

## 2017-01-26 DIAGNOSIS — Z170 Estrogen receptor positive status [ER+]: Secondary | ICD-10-CM | POA: Insufficient documentation

## 2017-01-26 DIAGNOSIS — R293 Abnormal posture: Secondary | ICD-10-CM | POA: Insufficient documentation

## 2017-01-26 DIAGNOSIS — Z510 Encounter for antineoplastic radiation therapy: Secondary | ICD-10-CM | POA: Insufficient documentation

## 2017-01-26 DIAGNOSIS — Z87891 Personal history of nicotine dependence: Secondary | ICD-10-CM | POA: Diagnosis not present

## 2017-01-26 DIAGNOSIS — C50411 Malignant neoplasm of upper-outer quadrant of right female breast: Secondary | ICD-10-CM | POA: Insufficient documentation

## 2017-01-26 DIAGNOSIS — E039 Hypothyroidism, unspecified: Secondary | ICD-10-CM | POA: Diagnosis not present

## 2017-01-26 DIAGNOSIS — K219 Gastro-esophageal reflux disease without esophagitis: Secondary | ICD-10-CM | POA: Insufficient documentation

## 2017-01-26 LAB — CBC WITH DIFFERENTIAL/PLATELET
BASO%: 0.8 % (ref 0.0–2.0)
Basophils Absolute: 0.1 10*3/uL (ref 0.0–0.1)
EOS%: 1 % (ref 0.0–7.0)
Eosinophils Absolute: 0.1 10*3/uL (ref 0.0–0.5)
HCT: 40.8 % (ref 34.8–46.6)
HGB: 13.7 g/dL (ref 11.6–15.9)
LYMPH%: 26.5 % (ref 14.0–49.7)
MCH: 31.4 pg (ref 25.1–34.0)
MCHC: 33.6 g/dL (ref 31.5–36.0)
MCV: 93.3 fL (ref 79.5–101.0)
MONO#: 0.5 10*3/uL (ref 0.1–0.9)
MONO%: 6.8 % (ref 0.0–14.0)
NEUT%: 64.9 % (ref 38.4–76.8)
NEUTROS ABS: 4.4 10*3/uL (ref 1.5–6.5)
PLATELETS: 259 10*3/uL (ref 145–400)
RBC: 4.38 10*6/uL (ref 3.70–5.45)
RDW: 13.8 % (ref 11.2–14.5)
WBC: 6.7 10*3/uL (ref 3.9–10.3)
lymph#: 1.8 10*3/uL (ref 0.9–3.3)

## 2017-01-26 LAB — COMPREHENSIVE METABOLIC PANEL
ALT: 29 U/L (ref 0–55)
ANION GAP: 7 meq/L (ref 3–11)
AST: 18 U/L (ref 5–34)
Albumin: 4.2 g/dL (ref 3.5–5.0)
Alkaline Phosphatase: 84 U/L (ref 40–150)
BUN: 13.6 mg/dL (ref 7.0–26.0)
CHLORIDE: 104 meq/L (ref 98–109)
CO2: 31 mEq/L — ABNORMAL HIGH (ref 22–29)
Calcium: 9.8 mg/dL (ref 8.4–10.4)
Creatinine: 0.9 mg/dL (ref 0.6–1.1)
EGFR: 66 mL/min/{1.73_m2} — ABNORMAL LOW (ref >90)
Glucose: 77 mg/dl (ref 70–140)
Potassium: 4.8 mEq/L (ref 3.5–5.1)
SODIUM: 142 meq/L (ref 136–145)
TOTAL PROTEIN: 7.6 g/dL (ref 6.4–8.3)
Total Bilirubin: 0.58 mg/dL (ref 0.20–1.20)

## 2017-01-26 NOTE — Patient Instructions (Signed)

## 2017-01-26 NOTE — Progress Notes (Signed)
[facility] CONSULT NOTE  Patient Care Team: Celene Squibb, MD as PCP - General (Internal Medicine) Brien Few, MD as Consulting Physician (Obstetrics and Gynecology) Rolm Bookbinder, MD as Consulting Physician (General Surgery) Nicholas Lose, MD as Consulting Physician (Hematology and Oncology) Gery Pray, MD as Consulting Physician (Radiation Oncology)  CHIEF COMPLAINTS/PURPOSE OF CONSULTATION:  Newly diagnosed breast cancer  HISTORY OF PRESENTING ILLNESS:  [name] Black 66 y.o. female is here because of recent diagnosis of right breast cancer. Patient had a screening mammogram the detected a right breast mass measuring 5 mm in size by ultrasound. Axilla was negative. 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 She was presented this morning in the multidisciplinary tumor board and she is here today to discuss treatment plan.  I reviewed her records extensively and collaborated the history with the patient.  SUMMARY OF ONCOLOGIC HISTORY:   Malignant neoplasm of upper-outer quadrant of right breast in female, estrogen receptor positive ([place])   01/19/2017 Initial Diagnosis    Screening detected right breast mass 5 mm size axilla negative biopsy: Grade 2 invasive ductal carcinoma with DCIS ER 95%, PR 60%, HER-2 negative ratio 1.38, Ki-67 12%, T1aN0 stage IA clinical stage      MEDICAL HISTORY:  Past Medical History:  Diagnosis Date  . GERD (gastroesophageal reflux disease)   . H/O seasonal allergies   . Hypothyroidism     SURGICAL HISTORY: Past Surgical History:  Procedure Laterality Date  . COLONOSCOPY    . COLONOSCOPY N/A 01/09/2014   Procedure: COLONOSCOPY;  Surgeon: Rogene Houston, MD;  Location: [facility];  Service: Endoscopy;  Laterality: N/A;  1030  . Cyst removed from left knee    . Fibroid removed and cyst removed from ovary    . Right knee arthroscopy       SOCIAL HISTORY: Social History   Social History  . Marital status: Married    Spouse name: N/A  . Number of children: N/A  . Years of education: N/A   Occupational History  . Not on file.   Social History Main Topics  . Smoking status: Former Smoker    Packs/day: 0.50    Years: 10.00    Types: Cigarettes  . Smokeless tobacco: Never Used  . Alcohol use 4.2 oz/week    7 Glasses of wine per week     Comment: 1-2 glasses of wine a night  . Drug use: No  . Sexual activity: Not on file   Other Topics Concern  . Not on file   Social History Narrative  . No narrative on file    FAMILY HISTORY: Family History  Problem Relation Age of Onset  . Cancer Mother 72       Breast  . Colon cancer Neg Hx     ALLERGIES:  has No Known Allergies.  MEDICATIONS:  Current Outpatient Prescriptions  Medication Sig Dispense Refill  . diphenhydrAMINE (BENADRYL) 25 MG tablet Take 25 mg by mouth as needed.    Nyoka Cowden Tea 150 MG CAPS Take by mouth.    Marland Kitchen ibuprofen (ADVIL,MOTRIN) 200 MG tablet Take 200 mg by mouth as needed.    Marland Kitchen levothyroxine (SYNTHROID, LEVOTHROID) 112 MCG tablet TAKE ONE (1) TABLET BY MOUTH EVERY DAY BEFORE BREAKFAST 90 tablet 4  . loratadine (CLARITIN) 10 MG tablet Take 10 mg by mouth as needed for allergies.    . Multiple  Vitamins-Minerals (MULTIVITAMINS THER. W/MINERALS) TABS tablet Take 1 tablet by mouth daily.    . Omega-3 Fatty Acids (FISH OIL) 1200 MG CAPS Take 1 capsule by mouth as needed.    Marland Kitchen omeprazole (PRILOSEC) 20 MG capsule TAKE ONE (1) CAPSULE EACH DAY 30 capsule 5  . b complex vitamins tablet Take 1 tablet by mouth daily.    . Probiotic Product (PROBIOTIC PO) Take 1 capsule by mouth daily.     No current facility-administered medications for this visit.     REVIEW OF SYSTEMS:   Constitutional: Denies fevers, chills or abnormal night sweats Eyes: Denies blurriness of vision, double vision or watery eyes Ears, nose, mouth, throat, and face: Denies  mucositis or sore throat Respiratory: Denies cough, dyspnea or wheezes Cardiovascular: Denies palpitation, chest discomfort or lower extremity swelling Gastrointestinal:  Denies nausea, heartburn or change in bowel habits Skin: Denies abnormal skin rashes Lymphatics: Denies new lymphadenopathy or easy bruising Neurological:Denies numbness, tingling or new weaknesses Behavioral/Psych: Mood is stable, no new changes  Breast:  Denies any palpable lumps or discharge All other systems were reviewed with the patient and are negative.  PHYSICAL EXAMINATION: ECOG PERFORMANCE STATUS: 1 - Symptomatic but completely ambulatory  Vitals:   01/26/17 1239  BP: (!) 153/87  Pulse: 83  Resp: 18  Temp: 98.6 F (37 C)   Filed Weights   01/26/17 1239  Weight: 159 lb 9.6 oz (72.4 kg)    GENERAL:alert, no distress and comfortable SKIN: skin color, texture, turgor are normal, no rashes or significant lesions EYES: normal, conjunctiva are pink and non-injected, sclera clear OROPHARYNX:no exudate, no erythema and lips, buccal mucosa, and tongue normal  NECK: supple, thyroid normal size, non-tender, without nodularity LYMPH:  no palpable lymphadenopathy in the cervical, axillary or inguinal LUNGS: clear to auscultation and percussion with normal breathing effort HEART: regular rate & rhythm and no murmurs and no lower extremity edema ABDOMEN:abdomen soft, non-tender and normal bowel sounds Musculoskeletal:no cyanosis of digits and no clubbing  PSYCH: alert & oriented x 3 with fluent speech NEURO: no focal motor/sensory deficits BREAST: No palpable nodules in breast. No palpable axillary or supraclavicular lymphadenopathy (exam performed in the presence of a chaperone)   LABORATORY DATA:  I have reviewed the data as listed Lab Results  Component Value Date   WBC 6.7 01/26/2017   HGB 13.7 01/26/2017   HCT 40.8 01/26/2017   MCV 93.3 01/26/2017   PLT 259 01/26/2017   Lab Results  Component Value  Date   NA 142 01/26/2017   K 4.8 01/26/2017   CL 104 06/25/2014   CO2 31 (H) 01/26/2017    RADIOGRAPHIC STUDIES: I have personally reviewed the radiological reports and agreed with the findings in the report.  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 After reviewing the pathology in detail, we proceeded to discuss the different treatment options between surgery, radiation, chemotherapy, antiestrogen therapies.  Recommendation: 1. Breast conserving surgery with sentinel lymph node biopsy 2. followed by adjuvant radiation 3. Followed by adjuvant antiestrogen therapy  Return to clinic after surgery to discuss the final pathology report   All questions were answered. The patient knows to call the clinic with any problems, questions or concerns.    Rulon Eisenmenger, MD 01/26/17

## 2017-01-26 NOTE — Assessment & Plan Note (Signed)
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 After reviewing the pathology in detail, we proceeded to discuss the different treatment options between surgery, radiation, chemotherapy, antiestrogen therapies.  Recommendation: 1. Breast conserving surgery with sentinel lymph node biopsy 2. followed by adjuvant radiation 3. Followed by adjuvant antiestrogen therapy  Return to clinic after surgery to discuss the final pathology report 

## 2017-01-26 NOTE — Progress Notes (Signed)
Radiation Oncology         [phone] ________________________________  Initial Outpatient Consultation  Name: [name] Black MRN: [id number]  Date: 01/26/2017  DOB: 10/15/1950  VO:ZDGU, Edwinna Areola, MD  Rolm Bookbinder, MD   REFERRING PHYSICIAN: Rolm Bookbinder, MD  DIAGNOSIS: The encounter diagnosis was Malignant neoplasm of upper-outer quadrant of right breast in female, estrogen receptor positive ([place]).  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 female who [facility]  Biopsy of the right breast in the 9:30 o'clock position showed invasive ductal carcinoma and DCIS, grade 2, spanning 0.5 x 0.5 x 0.4 cm.  On review of systems, the patient is positive for night sweats and thyroid problems.   Gynecologic History Age at first menstrual period? 14 Are you still having periods? No  Approximate date of last period? 2005 If you no longer have periods: Have you used hormone replacement? No Obstetric History:  How many children have you carried to term? 1  Your age at first live birth? 78 Pregnant now or trying to get pregnant? No Have you used birth control pills or hormone shots for contraception? Yes If so, for how long (or approximate dates)? 20 years Health Maintenance: Have you ever had a colonoscopy? Yes  If yes, date? 2004 and 2014 Have you ever had a bone density? Yes Date of your last PAP smear? 2017  Date of your FIRST mammogram? 1993   PREVIOUS RADIATION THERAPY: No  PAST MEDICAL HISTORY:  has a past medical history of GERD (gastroesophageal reflux disease); H/O seasonal allergies; and Hypothyroidism.    PAST SURGICAL HISTORY: Past Surgical History:  Procedure Laterality Date  . COLONOSCOPY    . COLONOSCOPY N/A 01/09/2014   Procedure: COLONOSCOPY;  Surgeon: Rogene Houston, MD;   [facility];  Service: Endoscopy;  Laterality: N/A;  1030  . Cyst removed from left knee    . Fibroid removed and cyst removed from ovary    . Right knee arthroscopy      FAMILY HISTORY: family history includes Cancer (age of onset: 28) in her mother.  SOCIAL HISTORY:  reports that she has quit smoking. Her smoking use included Cigarettes. She has a 5.00 pack-year smoking history. She has never used smokeless tobacco. She reports that she drinks about 4.2 oz of alcohol per week . She reports that she does not use drugs.  ALLERGIES: Patient has no known allergies.  MEDICATIONS:  Current Outpatient Prescriptions  Medication Sig Dispense Refill  . b complex vitamins tablet Take 1 tablet by mouth daily.    . diphenhydrAMINE (BENADRYL) 25 MG tablet Take 25 mg by mouth as needed.    Nyoka Cowden Tea 150 MG CAPS Take by mouth.    Marland Kitchen ibuprofen (ADVIL,MOTRIN) 200 MG tablet Take 200 mg by mouth as needed.    Marland Kitchen levothyroxine (SYNTHROID, LEVOTHROID) 112 MCG tablet TAKE ONE (1) TABLET BY MOUTH EVERY DAY BEFORE BREAKFAST 90 tablet 4  . loratadine (CLARITIN) 10 MG tablet Take 10 mg by mouth as needed for allergies.    . Multiple Vitamins-Minerals (MULTIVITAMINS THER. W/MINERALS) TABS tablet Take 1 tablet by mouth daily.    . Omega-3 Fatty Acids (FISH OIL) 1200 MG CAPS Take 1 capsule by mouth as needed.    Marland Kitchen omeprazole (PRILOSEC) 20 MG capsule TAKE ONE (1) CAPSULE EACH  DAY 30 capsule 5  . Probiotic Product (PROBIOTIC PO) Take 1 capsule by mouth daily.     No current facility-administered medications for this encounter.     REVIEW OF SYSTEMS:  A 10+ POINT REVIEW OF SYSTEMS WAS OBTAINED including neurology, dermatology, psychiatry, cardiac, respiratory, lymph, extremities, GI, GU, musculoskeletal, constitutional, reproductive, HEENT. All pertinent positives are noted in the HPI. All others are negative.   PHYSICAL EXAM:  Vitals with BMI 01/26/2017  Height 5' 6"   Weight 159 lbs 10 oz  BMI 10.2    Systolic 585  Diastolic 87  Pulse 83  Respirations 18  Lungs are clear to auscultation bilaterally. Heart has regular rate and rhythm. No palpable cervical, supraclavicular, or axillary adenopathy.  Left breast shows no palpable mass or nipple discharge. Right breast shows some bruising in the 8-10 o'clcok position of the breast. No palpable mass, nipple discharge, or bleeding.   ECOG = 1   LABORATORY DATA:  Lab Results  Component Value Date   WBC 6.7 01/26/2017   HGB 13.7 01/26/2017   HCT 40.8 01/26/2017   MCV 93.3 01/26/2017   PLT 259 01/26/2017   NEUTROABS 4.4 01/26/2017   Lab Results  Component Value Date   NA 142 01/26/2017   K 4.8 01/26/2017   CL 104 06/25/2014   CO2 31 (H) 01/26/2017   GLUCOSE 77 01/26/2017   CREATININE 0.9 01/26/2017   CALCIUM 9.8 01/26/2017      RADIOGRAPHY: US Breast Ltd Uni Right Inc Axilla  Result Date: 01/17/2017 CLINICAL DATA:  Possible mass right breast identified on recent screening mammogram. EXAM: 2D DIGITAL DIAGNOSTIC RIGHT MAMMOGRAM WITH ADJUNCT TOMO ULTRASOUND RIGHT BREAST COMPARISON:  01/06/2016 and earlier priors ACR Breast Density Category b: There are scattered areas of fibroglandular density. FINDINGS: Focal spot compression views of the outer right breast show a 0.5 cm oval nodule with indistinct margins. On physical exam, no mass is palpated in the outer right breast. Targeted ultrasound is performed, showing an irregular hypoechoic mass with some posterior acoustic shadowing at 9:30 position 3 cm from the nipple measuring 0.5 x 0.5 x 0.4 cm. Vascular flow is seen within an adjacent blood vessel, but not within the mass. Ultrasound of the right axilla is negative for lymphadenopathy. IMPRESSION: 0.5 cm irregular mass 9:30 position right breast. Malignancy cannot be excluded. RECOMMENDATION: Ultrasound-guided right breast biopsy is recommended and is being scheduled for the patient's convenience. I have discussed the findings and  recommendations with the patient. Results were also provided in writing at the conclusion of the visit. If applicable, a reminder letter will be sent to the patient regarding the next appointment. BI-RADS CATEGORY  4: Suspicious. Electronically Signed   By: Curlene Dolphin M.D.   On: 01/17/2017 09:15   Mm Diag Breast Tomo Uni Right  Result Date: 01/17/2017 CLINICAL DATA:  Possible mass right breast identified on recent screening mammogram. EXAM: 2D DIGITAL DIAGNOSTIC RIGHT MAMMOGRAM WITH ADJUNCT TOMO ULTRASOUND RIGHT BREAST COMPARISON:  01/06/2016 and earlier priors ACR Breast Density Category b: There are scattered areas of fibroglandular density. FINDINGS: Focal spot compression views of the outer right breast show a 0.5 cm oval nodule with indistinct margins. On physical exam, no mass is palpated in the outer right breast. Targeted ultrasound is performed, showing an irregular hypoechoic mass with some posterior acoustic shadowing at 9:30 position 3 cm from the nipple measuring 0.5 x 0.5 x 0.4 cm. Vascular flow is seen within an adjacent blood vessel, but not within the mass.  Ultrasound of the right axilla is negative for lymphadenopathy. IMPRESSION: 0.5 cm irregular mass 9:30 position right breast. Malignancy cannot be excluded. RECOMMENDATION: Ultrasound-guided right breast biopsy is recommended and is being scheduled for the patient's convenience. I have discussed the findings and recommendations with the patient. Results were also provided in writing at the conclusion of the visit. If applicable, a reminder letter will be sent to the patient regarding the next appointment. BI-RADS CATEGORY  4: Suspicious. Electronically Signed   By: Curlene Dolphin M.D.   On: 01/17/2017 09:15   Mm Screening Breast Tomo Bilateral  Result Date: 01/11/2017 CLINICAL DATA:  Screening. EXAM: 2D DIGITAL SCREENING BILATERAL MAMMOGRAM WITH CAD AND ADJUNCT TOMO COMPARISON:  Previous exam(s). ACR Breast Density Category b: There are  scattered areas of fibroglandular density. FINDINGS: In the right breast, a possible mass warrants further evaluation. In the left breast, no findings suspicious for malignancy. Images were processed with CAD. IMPRESSION: Further evaluation is suggested for possible mass in the right breast. RECOMMENDATION: Diagnostic mammogram and possibly ultrasound of the right breast. (Code:[id number]) The patient will be contacted regarding the findings, and additional imaging will be scheduled. BI-RADS CATEGORY  0: Incomplete. Need additional imaging evaluation and/or prior mammograms for comparison. Electronically Signed   By: Lajean Manes M.D.   On: 01/11/2017 13:46   Mm Clip Placement Right  Result Date: 01/19/2017 CLINICAL DATA:  Status post ultrasound-guided core biopsy of mass in the 9:30 o'clock location of the right breast EXAM: DIAGNOSTIC RIGHT MAMMOGRAM POST ULTRASOUND BIOPSY COMPARISON:  Previous exam(s). FINDINGS: Mammographic images were obtained following ultrasound guided biopsy of mass in the 9:30 o'clock location of the right breast. A ribbon shaped clip is identified in the upper-outer quadrant of the right breast as expected. IMPRESSION: Tissue marker clip in the expected location following biopsy. Final Assessment: Post Procedure Mammograms for Marker Placement Electronically Signed   By: Nolon Nations M.D.   On: 01/19/2017 15:53   [place] Rt Breast Bx W Loc Dev 1st Lesion Img Bx Spec US Guide  Addendum Date: 01/20/2017   ADDENDUM REPORT: 01/20/2017 13:57 ADDENDUM: Pathology revealed GRADE II INVASIVE DUCTAL CARCINOMA, DUCTAL CARCINOMA IN SITU of the Right breast, 9:30 o'clock. This was found to be concordant by Dr. Nolon Nations. Pathology results were discussed with the patient by telephone. The patient reported doing well after the biopsy with tenderness at the site. Post biopsy instructions and care were reviewed and questions were answered. The patient was encouraged to call [facility] for any additional concerns. The patient was referred to [facility] on Jan 26, 2017. Pathology results reported by Terie Purser, RN on 01/20/2017. Electronically Signed   By: Nolon Nations M.D.   On: 01/20/2017 13:57   Result Date: 01/20/2017 CLINICAL DATA:  The patient presents for ultrasound-guided core biopsy of mass in the 9:30 o'clock location of the right breast. EXAM: ULTRASOUND GUIDED RIGHT BREAST CORE NEEDLE BIOPSY COMPARISON:  Previous exam(s). FINDINGS: I met with the patient and we discussed the procedure of ultrasound-guided biopsy, including benefits and alternatives. We discussed the high likelihood of a successful procedure. We discussed the risks of the procedure, including infection, bleeding, tissue injury, clip migration, and inadequate sampling. Informed written consent was given. The usual time-out protocol was performed immediately prior to the procedure. Lesion quadrant: Upper outer quadrant of the right breast Using sterile technique and 1% Lidocaine as local anesthetic, under direct  ultrasound visualization, a 12 gauge spring-loaded device was used to perform biopsy of mass in 9:30 o'clock location of the right breast using a inferior approach. At the conclusion of the procedure a ribbon shaped tissue marker clip was deployed into the biopsy cavity. Follow up 2 view mammogram was performed and dictated separately. IMPRESSION: Ultrasound guided biopsy of right breast mass. No apparent complications. Electronically Signed: By: Nolon Nations M.D. On: 01/19/2017 15:51      IMPRESSION: Clinical stage IA invasive ductal carcinoma of the right breast. Patient is an excellent candidate for breast conservation therapy with surgery followed by radiation therapy. I discussed the course of treatment, side effects, and potential toxicities today with the patient and her husband. She is interested in  breast conservation therapy and will work with scheduling her surgery with Dr. Donne Hazel. I anticipate she will be a good candidate for hypofractionated treatment given her breast size and contour.   PLAN: Right lumpectomy and sentinel node procedure followed by adjuvant radiation therapy and aromatase inhibitor. At this time she would prefer to have her radiation therapy here in [place] rather than [place].   ------------------------------------------------  Blair Promise, PhD, MD  This document serves as a record of services personally performed by Gery Pray, MD. It was created on his behalf by Maryla Morrow, a trained medical scribe. The creation of this record is based on the scribe's personal observations and the provider's statements to them. This document has been checked and approved by the attending provider.

## 2017-01-26 NOTE — Therapy (Signed)
[place], [place], [address] Phone: [phone]   Fax:  [phone]  Physical Therapy Evaluation  Patient Details  Name: [name] Black MRN: [id number] Date of Birth: [birth year] Referring Provider: Dr. Rolm Bookbinder  Encounter Date: 01/26/2017      PT End of Session - 01/26/17 1620    Visit Number 1   Number of Visits 1   PT Start Time 8891   PT Stop Time 1354   PT Time Calculation (min) 39 min   Activity Tolerance Patient tolerated treatment well   Behavior During Therapy [facility] for tasks assessed/performed      Past Medical History:  Diagnosis Date  . GERD (gastroesophageal reflux disease)   . H/O seasonal allergies   . Hypothyroidism     Past Surgical History:  Procedure Laterality Date  . COLONOSCOPY    . COLONOSCOPY N/A 01/09/2014   Procedure: COLONOSCOPY;  Surgeon: Rogene Houston, MD;  [facility];  Service: Endoscopy;  Laterality: N/A;  1030  . Cyst removed from left knee    . Fibroid removed and cyst removed from ovary    . Right knee arthroscopy      There were no vitals filed for this visit.       Subjective Assessment - 01/26/17 1551    Subjective Patient reports she is here today to be seen by her medical team for her newly diagnosed right breast cancer.   Patient is accompained by: Family member   Pertinent History Patient was diagnosed on 01/11/17 with right grade 2 invasive ductal carcinoma breast cancer. It measures 5 mm and is located in the upper outer quadrant. It is ER/PR positive and HER2 negative with a Ki67 of 12%.    Patient Stated Goals Reduce lymphedema risk and learn post op shoulder ROM HEP   Currently in Pain? Yes   Pain Score 3    Pain Location Flank   Pain Orientation Right   Pain Descriptors / Indicators Aching   Pain Type Chronic pain   Pain Onset More than a month ago   Pain Frequency Intermittent   Aggravating Factors  Unknown   Pain Relieving Factors Unknown            [facility] PT Assessment - 01/26/17 0001      Assessment   Medical Diagnosis Right breast cancer   Referring Provider Dr. Rolm Bookbinder   Onset Date/Surgical Date 01/11/17   Hand Dominance Right   Prior Therapy none     Precautions   Precautions Other (comment)   Precaution Comments active cancer     Restrictions   Weight Bearing Restrictions No     Balance Screen   Has the patient fallen in the past 6 months No   Has the patient had a decrease in activity level because of a fear of falling?  No   Is the patient reluctant to leave their home because of a fear of falling?  No     Home Environment   Living Environment Private residence   Living Arrangements Spouse/significant other   Available Help at Discharge Family     Prior Function   Level of Independence Independent   Vocation Full time employment   Vocation Requirements Apartment Press photographer; does physical work   Leisure Does tai chi once a week     Cognition   Overall Cognitive Status Within Functional Limits for tasks assessed     Posture/Postural Control  Posture/Postural Control Postural limitations   Postural Limitations Forward head;Rounded Shoulders     ROM / Strength   AROM / PROM / Strength AROM;Strength     AROM   AROM Assessment Site Shoulder;Cervical   Right/Left Shoulder Right;Left   Right Shoulder Extension 36 Degrees   Right Shoulder Flexion 149 Degrees   Right Shoulder ABduction 153 Degrees   Right Shoulder Internal Rotation 65 Degrees   Right Shoulder External Rotation 90 Degrees   Left Shoulder Extension 53 Degrees   Left Shoulder Flexion 152 Degrees   Left Shoulder ABduction 168 Degrees   Left Shoulder Internal Rotation 77 Degrees   Left Shoulder External Rotation 85 Degrees   Cervical Flexion WNL   Cervical Extension WNL   Cervical - Right Side Bend 25% limited   Cervical - Left Side Bend 25% limited   Cervical - Right Rotation WNL    Cervical - Left Rotation WNL     Strength   Overall Strength Within functional limits for tasks performed           LYMPHEDEMA/ONCOLOGY QUESTIONNAIRE - 01/26/17 1617      Type   Cancer Type Right breast cancer     Lymphedema Assessments   Lymphedema Assessments Upper extremities     Right Upper Extremity Lymphedema   10 cm Proximal to Olecranon Process 26.8 cm   Olecranon Process 24.3 cm   10 cm Proximal to Ulnar Styloid Process 21.7 cm   Just Proximal to Ulnar Styloid Process 14.8 cm   Across Hand at [employer] 18.3 cm   At [place] of 2nd Digit 6.2 cm     Left Upper Extremity Lymphedema   10 cm Proximal to Olecranon Process 27.2 cm   Olecranon Process 24.4 cm   10 cm Proximal to Ulnar Styloid Process 21.2 cm   Just Proximal to Ulnar Styloid Process 14.4 cm   Across Hand at [employer] 18.1 cm   At [place] of 2nd Digit 5.9 cm        Patient was instructed today in a home exercise program today for post op shoulder range of motion. These included active assist shoulder flexion in sitting, scapular retraction, wall walking with shoulder abduction, and hands behind head external rotation.  She was encouraged to do these twice a day, holding 3 seconds and repeating 5 times when permitted by her physician.          PT Education - 01/26/17 1619    Education provided Yes   Education Details Lymphedema risk reduction and post op shoulder ROM HEP   Person(s) Educated Patient;Spouse   Methods Explanation;Demonstration;Handout   Comprehension Returned demonstration;Verbalized understanding              Breast Clinic Goals - 01/26/17 1623      Patient will be able to verbalize understanding of pertinent lymphedema risk reduction practices relevant to her diagnosis specifically related to skin care.   Time 1   Period Days   Status Achieved     Patient will be able to return demonstrate and/or verbalize understanding of the post-op home exercise program  related to regaining shoulder range of motion.   Time 1   Period Days   Status Achieved     Patient will be able to verbalize understanding of the importance of attending the postoperative After Breast Cancer Class for further lymphedema risk reduction education and therapeutic exercise.   Time 1   Period Days   Status Achieved  Plan - 01/26/17 1620    Clinical Impression Statement Patient was diagnosed on 01/11/17 with right grade 2 invasive ductal carcinoma breast cancer. It measures 5 mm and is located in the upper outer quadrant. It is ER/PR positive and HER2 negative with a Ki67 of 12%.  Her multidisciplinary medical team met prior to her assessments to determine a recommended treatment plan. She is planning to have a right lumpectomy and sentinel node biopsy followed by radiation and anti-estrogen therapy. She may benefit from post op shoulder PT to regain shoulder ROM and reduce lymphedema risk. Due to her lack of comorbidities, her eval is of low complexity.   Rehab Potential Excellent   Clinical Impairments Affecting Rehab Potential None   PT Frequency One time visit   PT Treatment/Interventions Patient/family education;Therapeutic exercise   PT Next Visit Plan Will f/u after surgery to determine PT needs   PT Home Exercise Plan Post op shoulder ROM HEP   Consulted and Agree with Plan of Care Patient;Family member/caregiver   Family Member Consulted Husband      Patient will benefit from skilled therapeutic intervention in order to improve the following deficits and impairments:  Postural dysfunction, Decreased knowledge of precautions, Pain, Impaired UE functional use, Decreased range of motion  Visit Diagnosis: Carcinoma of upper-outer quadrant of right breast in female, estrogen receptor positive ([place]) - Plan: PT plan of care cert/re-cert  Abnormal posture - Plan: PT plan of care cert/re-cert   Patient will follow up at outpatient cancer rehab if needed  following surgery.  If the patient requires physical therapy at that time, a specific plan will be dictated and sent to the referring physician for approval. The patient was educated today on appropriate basic range of motion exercises to begin post operatively and the importance of attending the After Breast Cancer class following surgery.  Patient was educated today on lymphedema risk reduction practices as it pertains to recommendations that will benefit the patient immediately following surgery.  She verbalized good understanding.  No additional physical therapy is indicated at this time.      Problem List Patient Active Problem List   Diagnosis Date Noted  . Malignant neoplasm of upper-outer quadrant of right breast in female, estrogen receptor positive ([place]) 01/25/2017  . Prediabetes 07/11/2015  . Hypothyroidism 06/25/2014  . Abdominal pain, chronic, right upper quadrant 06/25/2014    Annia Friendly, [place] 01/26/17 4:25 PM  [place], [place], [address] Phone: [phone]   Fax:  [phone]  Name: RIKKI TROSPER MRN: [id number] Date of Birth: [birth year]

## 2017-01-27 ENCOUNTER — Encounter: Payer: Self-pay | Admitting: General Practice

## 2017-01-27 NOTE — Progress Notes (Signed)
[place] Psychosocial Distress Screening Spiritual Care  Shadowed by counseling intern Becca Cash/LPCA, [place], met with [name] Black and her husband in [facility] to introduce [facility] team/resources, reviewing distress screen per protocol.  The patient scored a 5 on the Psychosocial Distress Thermometer which indicates moderate distress. Also assessed for distress and other psychosocial needs.   ONCBCN DISTRESS SCREENING 01/27/2017  Screening Type Initial Screening  Distress experienced in past week (1-10) 5  Emotional problem type Nervousness/Anxiety;Adjusting to illness  Information Concerns Type Lack of info about diagnosis;Lack of info about treatment;Lack of info about complementary therapy choices;Lack of info about maintaining fitness  Referral to support programs Yes   [name] finds deep meaning in spirituality and prayer and is a member of a church community. She has a reflective growth mindset that will help her cope with dx/tx.  Per pt, meeting the team and hearing good px has reduced her distress to a 3.  She is interested in support programming and may reach out to [name] as she moves forward in tx process.   Follow up needed: No.  Per couple, no other needs at this time. They have full packet of [place], but please also page if immediate needs arise. Thank you.   [place], [place], [facility] Pager [phone] Voicemail [phone]

## 2017-02-02 ENCOUNTER — Telehealth: Payer: Self-pay | Admitting: *Deleted

## 2017-02-02 NOTE — Telephone Encounter (Signed)
  Oncology Nurse Navigator Documentation  Navigator Location: [facility] (02/02/17 1400)   )Navigator Encounter Type: Telephone (02/02/17 1400) Telephone: Outgoing Call;Clinic/[facility] Follow-up (02/02/17 1400)                                                  Time Spent with Patient: 15 (02/02/17 1400)

## 2017-02-07 ENCOUNTER — Other Ambulatory Visit: Payer: Self-pay | Admitting: General Surgery

## 2017-02-07 DIAGNOSIS — Z170 Estrogen receptor positive status [ER+]: Principal | ICD-10-CM

## 2017-02-07 DIAGNOSIS — C50411 Malignant neoplasm of upper-outer quadrant of right female breast: Secondary | ICD-10-CM

## 2017-02-11 ENCOUNTER — Encounter (HOSPITAL_BASED_OUTPATIENT_CLINIC_OR_DEPARTMENT_OTHER): Payer: Self-pay | Admitting: *Deleted

## 2017-02-11 ENCOUNTER — Telehealth: Payer: Self-pay | Admitting: Hematology and Oncology

## 2017-02-11 NOTE — Telephone Encounter (Signed)
lvm to inform pt of 6/28 appt at 0945 per sch msg

## 2017-02-15 ENCOUNTER — Encounter: Payer: Self-pay | Admitting: Radiation Oncology

## 2017-02-16 ENCOUNTER — Ambulatory Visit
Admission: RE | Admit: 2017-02-16 | Discharge: 2017-02-16 | Disposition: A | Discharge status: Home / Self Care | Payer: BLUE CROSS/BLUE SHIELD | Source: Ambulatory Visit | Attending: General Surgery | Admitting: General Surgery

## 2017-02-16 DIAGNOSIS — Z170 Estrogen receptor positive status [ER+]: Principal | ICD-10-CM

## 2017-02-16 DIAGNOSIS — C50411 Malignant neoplasm of upper-outer quadrant of right female breast: Secondary | ICD-10-CM

## 2017-02-17 ENCOUNTER — Ambulatory Visit (HOSPITAL_COMMUNITY)
Admission: RE | Admit: 2017-02-17 | Discharge: 2017-02-17 | Disposition: A | Discharge status: Home / Self Care | Payer: BLUE CROSS/BLUE SHIELD | Source: Ambulatory Visit | Attending: General Surgery | Admitting: General Surgery

## 2017-02-17 ENCOUNTER — Ambulatory Visit (HOSPITAL_BASED_OUTPATIENT_CLINIC_OR_DEPARTMENT_OTHER): Payer: BLUE CROSS/BLUE SHIELD | Admitting: Anesthesiology

## 2017-02-17 ENCOUNTER — Ambulatory Visit
Admission: RE | Admit: 2017-02-17 | Discharge: 2017-02-17 | Disposition: A | Discharge status: Home / Self Care | Payer: BLUE CROSS/BLUE SHIELD | Source: Ambulatory Visit | Attending: General Surgery | Admitting: General Surgery

## 2017-02-17 ENCOUNTER — Encounter (HOSPITAL_BASED_OUTPATIENT_CLINIC_OR_DEPARTMENT_OTHER): Payer: Self-pay | Admitting: Anesthesiology

## 2017-02-17 ENCOUNTER — Ambulatory Visit (HOSPITAL_BASED_OUTPATIENT_CLINIC_OR_DEPARTMENT_OTHER)
Admission: RE | Admit: 2017-02-17 | Discharge: 2017-02-17 | Disposition: A | Discharge status: Home / Self Care | Payer: BLUE CROSS/BLUE SHIELD | Source: Ambulatory Visit | Attending: General Surgery | Admitting: General Surgery

## 2017-02-17 ENCOUNTER — Encounter (HOSPITAL_BASED_OUTPATIENT_CLINIC_OR_DEPARTMENT_OTHER)
Admission: RE | Disposition: A | Discharge status: Home / Self Care | Payer: Self-pay | Source: Ambulatory Visit | Attending: General Surgery

## 2017-02-17 DIAGNOSIS — Z87891 Personal history of nicotine dependence: Secondary | ICD-10-CM | POA: Diagnosis not present

## 2017-02-17 DIAGNOSIS — C50911 Malignant neoplasm of unspecified site of right female breast: Secondary | ICD-10-CM | POA: Diagnosis not present

## 2017-02-17 DIAGNOSIS — C50411 Malignant neoplasm of upper-outer quadrant of right female breast: Secondary | ICD-10-CM

## 2017-02-17 DIAGNOSIS — Z170 Estrogen receptor positive status [ER+]: Secondary | ICD-10-CM | POA: Insufficient documentation

## 2017-02-17 DIAGNOSIS — K219 Gastro-esophageal reflux disease without esophagitis: Secondary | ICD-10-CM | POA: Insufficient documentation

## 2017-02-17 DIAGNOSIS — Z803 Family history of malignant neoplasm of breast: Secondary | ICD-10-CM | POA: Insufficient documentation

## 2017-02-17 HISTORY — DX: Malignant (primary) neoplasm, unspecified: C80.1

## 2017-02-17 HISTORY — PX: RADIOACTIVE SEED GUIDED PARTIAL MASTECTOMY WITH AXILLARY SENTINEL LYMPH NODE BIOPSY: SHX6520

## 2017-02-17 HISTORY — PX: BREAST LUMPECTOMY: SHX2

## 2017-02-17 SURGERY — RADIOACTIVE SEED GUIDED PARTIAL MASTECTOMY WITH AXILLARY SENTINEL LYMPH NODE BIOPSY
Anesthesia: General | Site: Breast | Laterality: Right

## 2017-02-17 MED ORDER — HYDROCODONE-ACETAMINOPHEN 5-325 MG PO TABS: 1.0000 | ORAL_TABLET | ORAL | 0 refills | Status: DC | PRN

## 2017-02-17 MED ORDER — DEXAMETHASONE SODIUM PHOSPHATE 4 MG/ML IJ SOLN
INTRAMUSCULAR | Status: DC | PRN
  Administered 2017-02-17: 10 mg via INTRAVENOUS

## 2017-02-17 MED ORDER — DEXAMETHASONE SODIUM PHOSPHATE 10 MG/ML IJ SOLN
INTRAMUSCULAR | Status: AC
  Filled 2017-02-17: qty 1

## 2017-02-17 MED ORDER — METHYLENE BLUE 0.5 % INJ SOLN
INTRAVENOUS | Status: AC
  Filled 2017-02-17: qty 10

## 2017-02-17 MED ORDER — BUPIVACAINE HCL (PF) 0.25 % IJ SOLN
INTRAMUSCULAR | Status: DC | PRN
  Administered 2017-02-17: 7 mL

## 2017-02-17 MED ORDER — CEFAZOLIN SODIUM-DEXTROSE 2-4 GM/100ML-% IV SOLN
INTRAVENOUS | Status: AC
  Filled 2017-02-17: qty 100

## 2017-02-17 MED ORDER — MIDAZOLAM HCL 5 MG/5ML IJ SOLN
INTRAMUSCULAR | Status: DC | PRN
  Administered 2017-02-17: 2 mg via INTRAVENOUS

## 2017-02-17 MED ORDER — SCOPOLAMINE 1 MG/3DAYS TD PT72: 1.0000 | MEDICATED_PATCH | Freq: Once | TRANSDERMAL | Status: DC | PRN

## 2017-02-17 MED ORDER — FENTANYL CITRATE (PF) 100 MCG/2ML IJ SOLN
INTRAMUSCULAR | Status: AC
  Filled 2017-02-17: qty 2

## 2017-02-17 MED ORDER — ACETAMINOPHEN 500 MG PO TABS
ORAL_TABLET | ORAL | Status: AC
  Filled 2017-02-17: qty 2

## 2017-02-17 MED ORDER — CELECOXIB 200 MG PO CAPS
ORAL_CAPSULE | ORAL | Status: AC
  Filled 2017-02-17: qty 1

## 2017-02-17 MED ORDER — GABAPENTIN 300 MG PO CAPS
300.0000 mg | ORAL_CAPSULE | ORAL | Status: AC
  Administered 2017-02-17: 300 mg via ORAL

## 2017-02-17 MED ORDER — SODIUM CHLORIDE 0.9 % IJ SOLN
INTRAMUSCULAR | Status: AC
  Filled 2017-02-17: qty 10

## 2017-02-17 MED ORDER — BUPIVACAINE-EPINEPHRINE (PF) 0.5% -1:200000 IJ SOLN
INTRAMUSCULAR | Status: DC | PRN
  Administered 2017-02-17: 25 mL

## 2017-02-17 MED ORDER — MIDAZOLAM HCL 2 MG/2ML IJ SOLN
INTRAMUSCULAR | Status: AC
  Filled 2017-02-17: qty 2

## 2017-02-17 MED ORDER — LIDOCAINE 2% (20 MG/ML) 5 ML SYRINGE
INTRAMUSCULAR | Status: AC
  Filled 2017-02-17: qty 5

## 2017-02-17 MED ORDER — TECHNETIUM TC 99M SULFUR COLLOID FILTERED
1.0000 | Freq: Once | INTRAVENOUS | Status: AC | PRN
  Administered 2017-02-17: 1 via INTRADERMAL

## 2017-02-17 MED ORDER — GABAPENTIN 300 MG PO CAPS
ORAL_CAPSULE | ORAL | Status: AC
  Filled 2017-02-17: qty 1

## 2017-02-17 MED ORDER — ONDANSETRON HCL 4 MG/2ML IJ SOLN
INTRAMUSCULAR | Status: AC
  Filled 2017-02-17: qty 2

## 2017-02-17 MED ORDER — FENTANYL CITRATE (PF) 100 MCG/2ML IJ SOLN: 25.0000 ug | INTRAMUSCULAR | Status: DC | PRN

## 2017-02-17 MED ORDER — FENTANYL CITRATE (PF) 100 MCG/2ML IJ SOLN
INTRAMUSCULAR | Status: DC | PRN
  Administered 2017-02-17: 100 ug via INTRAVENOUS

## 2017-02-17 MED ORDER — MIDAZOLAM HCL 2 MG/2ML IJ SOLN
1.0000 mg | INTRAMUSCULAR | Status: DC | PRN
  Administered 2017-02-17: 2 mg via INTRAVENOUS

## 2017-02-17 MED ORDER — ACETAMINOPHEN 160 MG/5ML PO SOLN: 960.0000 mg | Freq: Once | ORAL | Status: DC

## 2017-02-17 MED ORDER — PROPOFOL 10 MG/ML IV BOLUS
INTRAVENOUS | Status: AC
  Filled 2017-02-17: qty 20

## 2017-02-17 MED ORDER — PHENYLEPHRINE 40 MCG/ML (10ML) SYRINGE FOR IV PUSH (FOR BLOOD PRESSURE SUPPORT)
PREFILLED_SYRINGE | INTRAVENOUS | Status: AC
  Filled 2017-02-17: qty 10

## 2017-02-17 MED ORDER — LIDOCAINE HCL (CARDIAC) 20 MG/ML IV SOLN
INTRAVENOUS | Status: DC | PRN
  Administered 2017-02-17: 30 mg via INTRAVENOUS

## 2017-02-17 MED ORDER — PHENYLEPHRINE 40 MCG/ML (10ML) SYRINGE FOR IV PUSH (FOR BLOOD PRESSURE SUPPORT)
PREFILLED_SYRINGE | INTRAVENOUS | Status: DC | PRN
  Administered 2017-02-17: 80 ug via INTRAVENOUS

## 2017-02-17 MED ORDER — LACTATED RINGERS IV SOLN
INTRAVENOUS | Status: DC
  Administered 2017-02-17 (×2): via INTRAVENOUS

## 2017-02-17 MED ORDER — CELECOXIB 200 MG PO CAPS
200.0000 mg | ORAL_CAPSULE | ORAL | Status: AC
  Administered 2017-02-17: 200 mg via ORAL

## 2017-02-17 MED ORDER — FENTANYL CITRATE (PF) 100 MCG/2ML IJ SOLN
50.0000 ug | INTRAMUSCULAR | Status: DC | PRN
  Administered 2017-02-17 (×2): 50 ug via INTRAVENOUS

## 2017-02-17 MED ORDER — ACETAMINOPHEN 500 MG PO TABS
1000.0000 mg | ORAL_TABLET | ORAL | Status: AC
  Administered 2017-02-17: 1000 mg via ORAL

## 2017-02-17 MED ORDER — CEFAZOLIN SODIUM-DEXTROSE 2-4 GM/100ML-% IV SOLN
2.0000 g | INTRAVENOUS | Status: AC
  Administered 2017-02-17: 2 g via INTRAVENOUS

## 2017-02-17 MED ORDER — PROPOFOL 10 MG/ML IV BOLUS
INTRAVENOUS | Status: DC | PRN
Start: 2017-02-17 — End: 2017-02-17
  Administered 2017-02-17: 150 mg via INTRAVENOUS

## 2017-02-17 SURGICAL SUPPLY — 64 items
ADH SKN CLS APL DERMABOND .7 (GAUZE/BANDAGES/DRESSINGS) ×1
APPLIER CLIP 9.375 MED OPEN (MISCELLANEOUS)
APR CLP MED 9.3 20 MLT OPN (MISCELLANEOUS)
BINDER BREAST LRG (GAUZE/BANDAGES/DRESSINGS) IMPLANT
BINDER BREAST MEDIUM (GAUZE/BANDAGES/DRESSINGS) IMPLANT
BINDER BREAST XLRG (GAUZE/BANDAGES/DRESSINGS) IMPLANT
BINDER BREAST XXLRG (GAUZE/BANDAGES/DRESSINGS) IMPLANT
BLADE SURG 15 STRL LF DISP TIS (BLADE) ×1 IMPLANT
BLADE SURG 15 STRL SS (BLADE) ×3
CANISTER SUC SOCK COL 7IN (MISCELLANEOUS) IMPLANT
CANISTER SUCT 1200ML W/VALVE (MISCELLANEOUS) IMPLANT
CHLORAPREP W/TINT 26ML (MISCELLANEOUS) ×3 IMPLANT
CLIP APPLIE 9.375 MED OPEN (MISCELLANEOUS) IMPLANT
CLIP TI WIDE RED SMALL 6 (CLIP) ×3 IMPLANT
CLOSURE WOUND 1/2 X4 (GAUZE/BANDAGES/DRESSINGS) ×1
COVER BACK TABLE 60X90IN (DRAPES) ×3 IMPLANT
COVER MAYO STAND STRL (DRAPES) ×3 IMPLANT
COVER PROBE W GEL 5X96 (DRAPES) ×3 IMPLANT
DECANTER SPIKE VIAL GLASS SM (MISCELLANEOUS) IMPLANT
DERMABOND ADVANCED (GAUZE/BANDAGES/DRESSINGS) ×2
DERMABOND ADVANCED .7 DNX12 (GAUZE/BANDAGES/DRESSINGS) ×1 IMPLANT
DEVICE DUBIN W/COMP PLATE 8390 (MISCELLANEOUS) ×3 IMPLANT
DRAPE LAPAROSCOPIC ABDOMINAL (DRAPES) ×3 IMPLANT
DRAPE UTILITY XL STRL (DRAPES) ×3 IMPLANT
ELECT COATED BLADE 2.86 ST (ELECTRODE) ×3 IMPLANT
ELECT REM PT RETURN 9FT ADLT (ELECTROSURGICAL) ×3
ELECTRODE REM PT RTRN 9FT ADLT (ELECTROSURGICAL) ×1 IMPLANT
GLOVE BIO SURGEON STRL SZ7 (GLOVE) ×6 IMPLANT
GLOVE BIOGEL PI IND STRL 6.5 (GLOVE) IMPLANT
GLOVE BIOGEL PI IND STRL 7.5 (GLOVE) ×1 IMPLANT
GLOVE BIOGEL PI INDICATOR 6.5 (GLOVE) ×2
GLOVE BIOGEL PI INDICATOR 7.5 (GLOVE) ×2
GLOVE ECLIPSE 6.5 STRL STRAW (GLOVE) ×2 IMPLANT
GOWN STRL REUS W/ TWL LRG LVL3 (GOWN DISPOSABLE) ×2 IMPLANT
GOWN STRL REUS W/TWL LRG LVL3 (GOWN DISPOSABLE) ×6
HEMOSTAT ARISTA ABSORB 3G PWDR (MISCELLANEOUS) IMPLANT
ILLUMINATOR WAVEGUIDE N/F (MISCELLANEOUS) ×2 IMPLANT
KIT MARKER MARGIN INK (KITS) ×3 IMPLANT
LIGHT WAVEGUIDE WIDE FLAT (MISCELLANEOUS) IMPLANT
NDL HYPO 25X1 1.5 SAFETY (NEEDLE) ×1 IMPLANT
NDL SAFETY ECLIPSE 18X1.5 (NEEDLE) IMPLANT
NEEDLE HYPO 18GX1.5 SHARP (NEEDLE)
NEEDLE HYPO 25X1 1.5 SAFETY (NEEDLE) ×3 IMPLANT
NS IRRIG 1000ML POUR BTL (IV SOLUTION) IMPLANT
PACK BASIN DAY SURGERY FS (CUSTOM PROCEDURE TRAY) ×3 IMPLANT
PENCIL BUTTON HOLSTER BLD 10FT (ELECTRODE) ×3 IMPLANT
SLEEVE SCD COMPRESS KNEE MED (MISCELLANEOUS) ×3 IMPLANT
SPONGE LAP 4X18 X RAY DECT (DISPOSABLE) ×3 IMPLANT
STRIP CLOSURE SKIN 1/2X4 (GAUZE/BANDAGES/DRESSINGS) ×2 IMPLANT
SUT ETHILON 2 0 FS 18 (SUTURE) IMPLANT
SUT MNCRL AB 4-0 PS2 18 (SUTURE) ×3 IMPLANT
SUT MON AB 5-0 PS2 18 (SUTURE) ×2 IMPLANT
SUT SILK 2 0 SH (SUTURE) IMPLANT
SUT VIC AB 2-0 SH 27 (SUTURE) ×6
SUT VIC AB 2-0 SH 27XBRD (SUTURE) ×1 IMPLANT
SUT VIC AB 3-0 SH 27 (SUTURE) ×3
SUT VIC AB 3-0 SH 27X BRD (SUTURE) ×1 IMPLANT
SUT VIC AB 5-0 PS2 18 (SUTURE) IMPLANT
SYR CONTROL 10ML LL (SYRINGE) ×3 IMPLANT
TOWEL OR 17X24 6PK STRL BLUE (TOWEL DISPOSABLE) ×3 IMPLANT
TOWEL OR NON WOVEN STRL DISP B (DISPOSABLE) ×3 IMPLANT
TUBE CONNECTING 20'X1/4 (TUBING) ×1
TUBE CONNECTING 20X1/4 (TUBING) ×1 IMPLANT
YANKAUER SUCT BULB TIP NO VENT (SUCTIONS) ×2 IMPLANT

## 2017-02-17 NOTE — Op Note (Signed)
Preoperative diagnosis: Rightbreast cancer, clinical stage I Postoperative diagnosis: same as above Procedure: 1. Rightbreast seed guided lumpectomy 2. Right deep axillary sentinel node biopsy Surgeon Dr Serita Grammes Anes general  EBL: minimal Comps none Specimen:  1. Right breast marked with paint containing clip and seeds 2. Rightaxillary sentinel nodes with highest count 803 3. Additional right breast lateral margin marked short superior, long lateral double deep Sponge count correct at completion Dispoto recovery stable  Indications: This is a 38 yof with newly diagnosed right breast IDC. We have decided to proceed with lumpectomy/sn biopsy.She had a seedplaced prior to beginning. .  I had these mm in the OR.   Procedure: After informed consent was obtained the patient was taken to the OR. She was injected with technetium in the standard periareolar fashion.She was given anitibiotics. SCDs were in place. She was prepped and draped in the standard sterile surgical fashion. A timeout was performed. She had undergone a pectoral block.  I then located the seed in the lateral breast. I infiltrated marcaine around the areola. I then made a periareolar incision to hide the scar. I then used lighted retractors to tunnel to the lesion. I then removed the seedwith an attempt to get a clear margin. This waspassed off the table after marking with paint.I did confirm removal of theclipand seedwith mammography. Pathology thought the lateral margin was close so I removed more at this location.  I obtained hemostasis. I placed clips in this cavity. The breast tissue was approximated with 2-0 vicryl. I then closed with 2-0 vicryl, 3-0 vicryl and 5-0 monocryl. Glue and steristrips were applied. I then made an incision below the axillary hairline. I then opened the axillary fascia. I identified the sentinel nodes with the highest count as above.There was no gross nodal  disease.There was no background radioactivity. I obtained hemostasis. I closed the axillary fascia and breast tissue with 2-0 vicryl. I then closed the dermis with 3-0 vicryl and the skin with 4-0 monocryl.  Glue and steristrips were placed.A breast binder was placed. She was extubated and transferred to recovery stable

## 2017-02-17 NOTE — Anesthesia Procedure Notes (Signed)
Procedure Name: LMA Insertion Date/Time: 02/17/2017 10:48 AM Performed by: Toula Moos L Pre-anesthesia Checklist: Patient identified, Emergency Drugs available, Suction available, Patient being monitored and Timeout performed Patient Re-evaluated:Patient Re-evaluated prior to inductionOxygen Delivery Method: Circle system utilized Preoxygenation: Pre-oxygenation with 100% oxygen Intubation Type: IV induction Ventilation: Mask ventilation without difficulty LMA: LMA inserted LMA Size: 4.0 Number of attempts: 1 Airway Equipment and Method: Bite block Placement Confirmation: positive ETCO2 Tube secured with: Tape Dental Injury: Teeth and Oropharynx as per pre-operative assessment

## 2017-02-17 NOTE — Interval H&P Note (Signed)
History and Physical Interval Note:  02/17/2017 10:27 AM  [name] Black  has presented today for surgery, with the diagnosis of RIGHT BREAST CANCER  The various methods of treatment have been discussed with the patient and family. After consideration of risks, benefits and other options for treatment, the patient has consented to  Procedure(s): RADIOACTIVE SEED GUIDED PARTIAL MASTECTOMY WITH AXILLARY SENTINEL LYMPH NODE BIOPSY (Right) as a surgical intervention .  The patient's history has been reviewed, patient examined, no change in status, stable for surgery.  I have reviewed the patient's chart and labs.  Questions were answered to the patient's satisfaction.     [name]

## 2017-02-17 NOTE — Progress Notes (Signed)
Nuc med injection performed by nuc med staff. Pt tol well with additional fentanyl given for comfort. VSS. Will call family to bedside and update/provide emotional support

## 2017-02-17 NOTE — Discharge Instructions (Signed)
[facility] °Office Phone Number [phone] ° °POST OP INSTRUCTIONS ° °Always review your discharge instruction sheet given to you by the facility where your surgery was performed. ° °IF YOU HAVE DISABILITY OR FAMILY LEAVE FORMS, YOU MUST BRING THEM TO THE OFFICE FOR PROCESSING.  DO NOT GIVE THEM TO YOUR DOCTOR. ° °1. A prescription for pain medication may be given to you upon discharge.  Take your pain medication as prescribed, if needed.  If narcotic pain medicine is not needed, then you may take acetaminophen (Tylenol), naprosyn (Alleve) or ibuprofen (Advil) as needed. °2. Take your usually prescribed medications unless otherwise directed °3. If you need a refill on your pain medication, please contact your pharmacy.  They will contact our office to request authorization.  Prescriptions will not be filled after 5pm or on week-ends. °4. You should eat very light the first 24 hours after surgery, such as soup, crackers, pudding, etc.  Resume your normal diet the day after surgery. °5. Most patients will experience some swelling and bruising in the breast.  Ice packs and a good support bra will help.  Wear the breast binder provided or a sports bra for 72 hours day and night.  After that wear a sports bra during the day until you return to the office. Swelling and bruising can take several days to resolve.  °6. It is common to experience some constipation if taking pain medication after surgery.  Increasing fluid intake and taking a stool softener will usually help or prevent this problem from occurring.  A mild laxative (Milk of Magnesia or Miralax) should be taken according to package directions if there are no bowel movements after 48 hours. °7. Unless discharge instructions indicate otherwise, you may remove your bandages 48 hours after surgery and you may shower at that time.  You may have steri-strips (small skin tapes) in place directly over the incision.  These strips should be left on the  skin for 7-10 days and will come off on their own.  If your surgeon used skin glue on the incision, you may shower in 24 hours.  The glue will flake off over the next 2-3 weeks.  Any sutures or staples will be removed at the office during your follow-up visit. °8. ACTIVITIES:  You may resume regular daily activities (gradually increasing) beginning the next day.  Wearing a good support bra or sports bra minimizes pain and swelling.  You may have sexual intercourse when it is comfortable. °a. You may drive when you no longer are taking prescription pain medication, you can comfortably wear a seatbelt, and you can safely maneuver your car and apply brakes. °b. RETURN TO WORK:  ______________________________________________________________________________________ °9. You should see your doctor in the office for a follow-up appointment approximately two weeks after your surgery.  Your doctor’s nurse will typically make your follow-up appointment when she calls you with your pathology report.  Expect your pathology report 3-4 business days after your surgery.  You may call to check if you do not hear from us after three days. °10. OTHER INSTRUCTIONS: _______________________________________________________________________________________________ _____________________________________________________________________________________________________________________________________ °_____________________________________________________________________________________________________________________________________ °_____________________________________________________________________________________________________________________________________ ° °WHEN TO CALL DR WAKEFIELD: °1. Fever over 101.0 °2. Nausea and/or vomiting. °3. Extreme swelling or bruising. °4. Continued bleeding from incision. °5. Increased pain, redness, or drainage from the incision. ° °The clinic staff is available to answer your questions during regular  business hours.  Please don’t hesitate to call and ask to speak to one of the nurses for clinical concerns.  If   you have a medical emergency, go to the nearest emergency room or call 911.  A surgeon from [facility] is always on call at the hospital. ° °For further questions, please visit [email or web address] ° ° ° ° ° °Post Anesthesia Home Care Instructions ° °Activity: °Get plenty of rest for the remainder of the day. A responsible individual must stay with you for 24 hours following the procedure.  °For the next 24 hours, DO NOT: °-Drive a car °-Operate machinery °-Drink alcoholic beverages °-Take any medication unless instructed by your physician °-Make any legal decisions or sign important papers. ° °Meals: °Start with liquid foods such as gelatin or soup. Progress to regular foods as tolerated. Avoid greasy, spicy, heavy foods. If nausea and/or vomiting occur, drink only clear liquids until the nausea and/or vomiting subsides. Call your physician if vomiting continues. ° °Special Instructions/Symptoms: °Your throat may feel dry or sore from the anesthesia or the breathing tube placed in your throat during surgery. If this causes discomfort, gargle with warm salt water. The discomfort should disappear within 24 hours. ° °If you had a scopolamine patch placed behind your ear for the management of post- operative nausea and/or vomiting: ° °1. The medication in the patch is effective for 72 hours, after which it should be removed.  Wrap patch in a tissue and discard in the trash. Wash hands thoroughly with soap and water. °2. You may remove the patch earlier than 72 hours if you experience unpleasant side effects which may include dry mouth, dizziness or visual disturbances. °3. Avoid touching the patch. Wash your hands with soap and water after contact with the patch. °  ° °

## 2017-02-17 NOTE — Anesthesia Procedure Notes (Signed)
Anesthesia Regional Block: Pectoralis block   Pre-Anesthetic Checklist: ,, timeout performed, Correct Patient, Correct Site, Correct Laterality, Correct Procedure, Correct Position, site marked, Risks and benefits discussed, pre-op evaluation,  At surgeon's request and post-op pain management  Laterality: Right  Prep: chloraprep       Needles:   Needle Type: Echogenic Needle     Needle Length: 9cm  Needle Gauge: 21     Additional Needles:   Procedures: ultrasound guided,,,,,,,,  Narrative:  Start time: 02/17/2017 10:00 AM End time: 02/17/2017 10:10 AM Injection made incrementally with aspirations every 5 mL. Anesthesiologist: Lyndle Herrlich  Additional Notes: 25cc .5% marcaine w/ epi

## 2017-02-17 NOTE — Anesthesia Preprocedure Evaluation (Signed)
Anesthesia Evaluation  Patient identified by MRN, date of birth, ID band Patient awake    Reviewed: Allergy & Precautions, H&P , Patient's Chart, lab work & pertinent test results, reviewed documented beta blocker date and time   Airway Mallampati: II  TM Distance: >3 FB Neck ROM: full    Dental no notable dental hx.    Pulmonary former smoker,    Pulmonary exam normal breath sounds clear to auscultation       Cardiovascular  Rhythm:regular Rate:Normal     Neuro/Psych    GI/Hepatic GERD  ,  Endo/Other    Renal/GU      Musculoskeletal   Abdominal   Peds  Hematology   Anesthesia Other Findings   Reproductive/Obstetrics                             Anesthesia Physical Anesthesia Plan  ASA: II  Anesthesia Plan: General   Post-op Pain Management:  Regional for Post-op pain   Induction: Intravenous  PONV Risk Score and Plan: 2 and Ondansetron, Dexamethasone and Propofol  Airway Management Planned: LMA  Additional Equipment:   Intra-op Plan:   Post-operative Plan: Extubation in OR  Informed Consent: I have reviewed the patients History and Physical, chart, labs and discussed the procedure including the risks, benefits and alternatives for the proposed anesthesia with the patient or authorized representative who has indicated his/her understanding and acceptance.   Dental advisory given  Plan Discussed with: CRNA and Surgeon  Anesthesia Plan Comments: ( )        Anesthesia Quick Evaluation

## 2017-02-17 NOTE — Transfer of Care (Signed)
Immediate Anesthesia Transfer of Care Note  Patient: [name] Black  Procedure(s) Performed: Procedure(s): RADIOACTIVE SEED GUIDED PARTIAL MASTECTOMY WITH AXILLARY SENTINEL LYMPH NODE BIOPSY (Right)  Patient Location: PACU  Anesthesia Type:GA combined with regional for post-op pain  Level of Consciousness: sedated  Airway & Oxygen Therapy: Patient Spontanous Breathing and Patient connected to face mask oxygen  Post-op Assessment: Report given to RN and Post -op Vital signs reviewed and stable  Post vital signs: Reviewed and stable  Last Vitals:  Vitals:   02/17/17 1010 02/17/17 1015  BP: 124/90   Pulse: 86 83  Resp: 15 10  Temp:      Last Pain:  Vitals:   02/17/17 0918  TempSrc: Oral         Complications: No apparent anesthesia complications

## 2017-02-17 NOTE — Progress Notes (Signed)
Assisted Dr. Kyle Jackson with right, ultrasound guided, pectoralis block. Side rails up, monitors on throughout procedure. See vital signs in flow sheet. Tolerated Procedure well. 

## 2017-02-17 NOTE — H&P (Signed)
66 yof referred by Dr Allyn Kenner for newly diagnosed right breast cancer. she has family history in her aunt. she has no prior history. this was screening mm and she did not have a mass or dc. density is b. mm shows a 5 mm mass present. US shows a 5x5x4 mm mass and axilla is negative. core biopsy was done and shows idc with dcis, grade II, er pos at 95, pr pos at 41, her 2 is negative and Ki is 12%. she is here to discuss options.  Past Surgical History Conni Slipper, RN; 01/26/2017 7:28 AM) Breast Biopsy  Right. Knee Surgery  Bilateral.  Diagnostic Studies History Conni Slipper, RN; 01/26/2017 7:28 AM) Colonoscopy  1-5 years ago Mammogram  within last year Pap Smear  1-5 years ago  Medication History Conni Slipper, RN; 01/26/2017 7:28 AM) Medications Reconciled  Social History Conni Slipper, RN; 01/26/2017 7:28 AM) Alcohol use  Moderate alcohol use. Caffeine use  Coffee. No drug use  Tobacco use  Former smoker.  Family History Conni Slipper, RN; 01/26/2017 7:28 AM) Arthritis  Mother. Breast Cancer  Mother. Cerebrovascular Accident  Father. Diabetes Mellitus  Mother, Son. Hypertension  Father.  Pregnancy / Birth History Conni Slipper, RN; 01/26/2017 7:28 AM) Age at menarche  66 years. Age of menopause  51-55 Contraceptive History  Oral contraceptives. Gravida  1 Length (months) of breastfeeding  3-6 Maternal age  44-40 Para  1  Other Problems Conni Slipper, RN; 01/26/2017 7:28 AM) Asthma  Gastroesophageal Reflux Disease  Hepatitis  Lump In Breast  Thyroid Disease    Review of Systems Conni Slipper RN; 01/26/2017 7:28 AM) General Not Present- Appetite Loss, Chills, Fatigue, Fever, Night Sweats, Weight Gain and Weight Loss. Skin Not Present- Change in Wart/Mole, Dryness, Hives, Jaundice, New Lesions, Non-Healing Wounds, Rash and Ulcer. HEENT Present- Seasonal Allergies and Wears glasses/contact lenses. Not Present- Earache, Hearing Loss, Hoarseness,  Nose Bleed, Oral Ulcers, Ringing in the Ears, Sinus Pain, Sore Throat, Visual Disturbances and Yellow Eyes. Respiratory Present- Snoring. Not Present- Bloody sputum, Chronic Cough, Difficulty Breathing and Wheezing. Breast Present- Breast Mass and Breast Pain. Not Present- Nipple Discharge and Skin Changes. Cardiovascular Not Present- Chest Pain, Difficulty Breathing Lying Down, Leg Cramps, Palpitations, Rapid Heart Rate, Shortness of Breath and Swelling of Extremities. Gastrointestinal Not Present- Abdominal Pain, Bloating, Bloody Stool, Change in Bowel Habits, Chronic diarrhea, Constipation, Difficulty Swallowing, Excessive gas, Gets full quickly at meals, Hemorrhoids, Indigestion, Nausea, Rectal Pain and Vomiting. Female Genitourinary Not Present- Frequency, Nocturia, Painful Urination, Pelvic Pain and Urgency. Musculoskeletal Not Present- Back Pain, Joint Pain, Joint Stiffness, Muscle Pain, Muscle Weakness and Swelling of Extremities. Neurological Not Present- Decreased Memory, Fainting, Headaches, Numbness, Seizures, Tingling, Tremor, Trouble walking and Weakness. Psychiatric Not Present- Anxiety, Bipolar, Change in Sleep Pattern, Depression, Fearful and Frequent crying. Endocrine Present- Hot flashes. Not Present- Cold Intolerance, Excessive Hunger, Hair Changes, Heat Intolerance and New Diabetes. Hematology Not Present- Blood Thinners, Easy Bruising, Excessive bleeding, Gland problems, HIV and Persistent Infections.   Physical Exam Rolm Bookbinder MD; 01/27/2017 8:46 AM) General Mental Status-Alert. Head and Neck Thyroid -Note: no thyromegaly. Eye Sclera/Conjunctiva - Bilateral-No scleral icterus. Chest and Lung Exam Chest and lung exam reveals -quiet, even and easy respiratory effort with no use of accessory muscles and on auscultation, normal breath sounds, no adventitious sounds and normal vocal resonance. Breast Nipples-No Discharge. Breast Lump-No Palpable Breast  Mass. Cardiovascular Cardiovascular examination reveals -normal heart sounds, regular rate and rhythm with no murmurs. Abdomen Note: soft nt  Neurologic Neurologic evaluation reveals -alert and oriented x 3 with no impairment of recent or remote memory. Motor-Normal. Lymphatic Head & Neck General Head & Neck Lymphatics: Bilateral - Description - Normal. Axillary General Axillary Region: Bilateral - Description - Normal. Note: no Lawson adenopathy   Assessment & Plan Rolm Bookbinder MD; 01/27/2017 8:52 AM) BREAST CANCER OF UPPER-OUTER QUADRANT OF RIGHT FEMALE BREAST (C50.411) Story: Right breast seed guided lumpectomy, right axillary sentinel node biopsy We discussed the staging and pathophysiology of breast cancer. We discussed all of the different options for treatment for breast cancer including surgery, chemotherapy, radiation therapy, Herceptin, and antiestrogen therapy. We discussed a sentinel lymph node biopsy as she does not appear to having lymph node involvement right now. We discussed the performance of that with injection of radioactive tracer. We discussed that there is a chance of having a positive node with a sentinel lymph node biopsy and we will await the permanent pathology to make any other first further decisions in terms of her treatment. We discussed up to a 5% risk lifetime of chronic shoulder pain as well as lymphedema associated with a sentinel lymph node biopsy. We discussed the options for treatment of the breast cancer which included lumpectomy versus a mastectomy. We discussed the performance of the lumpectomy with radioactive seed placement. We discussed a 5-10% chance of a positive margin requiring reexcision in the operating room. We also discussed that she will need radiation therapy if she undergoes lumpectomy. The breast cannot undergo more radiation therapy in the same breast after lumpectomy in the future. We discussed the mastectomy (removal of whole  breast) and the postoperative care for that as well. Mastectomy can be followed by reconstruction. This is a more extensive surgery and requires more recovery. The decision for lumpectomy vs mastectomy has no impact on decision for chemotherapy. Most mastectomy patients will not need radiation therapy. We discussed that there is no difference in her survival whether she undergoes lumpectomy with radiation therapy or antiestrogen therapy versus a mastectomy. There is also no real difference between her recurrence in the breast. further therapy based on results of surgery

## 2017-02-18 ENCOUNTER — Encounter (HOSPITAL_BASED_OUTPATIENT_CLINIC_OR_DEPARTMENT_OTHER): Payer: Self-pay | Admitting: General Surgery

## 2017-02-18 NOTE — Addendum Note (Signed)
Addendum  created 02/18/17 1221 by Tawni Millers, CRNA   Charge Capture section accepted

## 2017-02-18 NOTE — Anesthesia Postprocedure Evaluation (Signed)
Anesthesia Post Note  Patient: [name] Black  Procedure(s) Performed: Procedure(s) (LRB): RADIOACTIVE SEED GUIDED PARTIAL MASTECTOMY WITH AXILLARY SENTINEL LYMPH NODE BIOPSY (Right)     Patient location during evaluation: PACU Anesthesia Type: General Level of consciousness: awake and alert Pain management: pain level controlled Vital Signs Assessment: post-procedure vital signs reviewed and stable Respiratory status: spontaneous breathing, nonlabored ventilation, respiratory function stable and patient connected to nasal cannula oxygen Cardiovascular status: blood pressure returned to baseline and stable Postop Assessment: no signs of nausea or vomiting Anesthetic complications: no    Last Vitals:  Vitals:   02/17/17 1230 02/17/17 1306  BP: (!) 158/89 (!) 166/95  Pulse: 73 69  Resp: 14 16  Temp:  36.6 C    Last Pain:  Vitals:   02/18/17 0955  TempSrc:   PainSc: 2                  Willis Kuipers EDWARD

## 2017-02-23 ENCOUNTER — Other Ambulatory Visit: Payer: Self-pay | Admitting: General Surgery

## 2017-02-28 NOTE — Progress Notes (Signed)
Location of Breast Cancer:right breast   Histology per Pathology Report:  02/17/2017   Receptor Status: ER(+), PR (+), Her2-neu (-), Ki-(12%)  Did patient present with symptoms (if so, please note symptoms) or was this found on screening mammography?: Screening  Past/Anticipated interventions by surgeon, if any: 02/17/2017 Procedure: RADIOACTIVE SEED GUIDED PARTIAL MASTECTOMY WITH AXILLARY SENTINEL LYMPH NODE BIOPSY;  Surgeon: Rolm Bookbinder, MD;  Location: [facility];  Service: General;  Laterality: Right;  Past/Anticipated interventions by medical oncology, if any: Per Dr. Lindi Adie following radiation - has ordered Oncotype - adjuvant antiestrogen therapy  Lymphedema issues, if any:  {:no  Pain issues, if any:  Has pain, like bee stings in her right upper arm, that started 2 weeks ago.  She saw Dr. Lucia Gaskins last week who thinks a nerve was "nicked."  SAFETY ISSUES:  Prior radiation? no  Pacemaker/ICD? no  Possible current pregnancy?no  Is the patient on methotrexate? no  Current Complaints / other details:  Patient is here with her husband.  BP (!) 161/85 (BP Location: Left Arm, Patient Position: Sitting)   Pulse 81   Temp 98.3 F (36.8 C) (Oral)   Ht 5' 6"  (1.676 m)   Wt 162 lb 3.2 oz (73.6 kg)   SpO2 99%   BMI 26.18 kg/m    Wt Readings from Last 3 Encounters:  03/07/17 162 lb 3.2 oz (73.6 kg)  03/03/17 160 lb 1.6 oz (72.6 kg)  02/17/17 159 lb (72.1 kg)

## 2017-03-03 ENCOUNTER — Ambulatory Visit (HOSPITAL_BASED_OUTPATIENT_CLINIC_OR_DEPARTMENT_OTHER): Payer: BLUE CROSS/BLUE SHIELD | Admitting: Hematology and Oncology

## 2017-03-03 ENCOUNTER — Telehealth: Payer: Self-pay | Admitting: *Deleted

## 2017-03-03 ENCOUNTER — Encounter: Payer: Self-pay | Admitting: Hematology and Oncology

## 2017-03-03 DIAGNOSIS — C50411 Malignant neoplasm of upper-outer quadrant of right female breast: Secondary | ICD-10-CM

## 2017-03-03 DIAGNOSIS — Z170 Estrogen receptor positive status [ER+]: Secondary | ICD-10-CM

## 2017-03-03 NOTE — Progress Notes (Signed)
Patient Care Team: Celene Squibb, MD as PCP - General (Internal Medicine) Brien Few, MD as Consulting Physician (Obstetrics and Gynecology) Rolm Bookbinder, MD as Consulting Physician (General Surgery) Nicholas Lose, MD as Consulting Physician (Hematology and Oncology) Gery Pray, MD as Consulting Physician (Radiation Oncology)  DIAGNOSIS:  Encounter Diagnosis  Name Primary?  . 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%, HER-2 negative ratio 1.38, Ki-67 12%, T1aN0 stage IA clinical stage      02/17/2017 Surgery    Right lumpectomy: Invasive ductal carcinoma, grade 2, 0.7 cm, DCIS intermediate grade, margins negative, lymphovascular invasion present, additional lateral margin residual IDC grade 2 with DCIS margins negative, 0/2 lymph nodes, T1b N0 stage IA ER 95%, PR 60%, HER-2 negative, Ki-67 12%       CHIEF COMPLIANT: Follow-up of right lumpectomy  INTERVAL HISTORY: [name] Black is a 66 year old with above-mentioned history of right breast cancer underwent lumpectomy and is here today to discuss the results. She is healing very well from the recent surgery with exception of under the armpit where she has significant tenderness and discomfort. She has a very abnormal sensation with pain like sensation medial aspect of the right arm. She also experienced some tingling and numbness in the fingertips.  REVIEW OF SYSTEMS:   Constitutional: Denies fevers, chills or abnormal weight loss Eyes: Denies blurriness of vision Ears, nose, mouth, throat, and face: Denies mucositis or sore throat Respiratory: Denies cough,  dyspnea or wheezes Cardiovascular: Denies palpitation, chest discomfort Gastrointestinal:  Denies nausea, heartburn or change in bowel habits Skin: Denies abnormal skin rashes Lymphatics: Denies new lymphadenopathy or easy bruising Neurological:Denies numbness, tingling or new weaknesses Behavioral/Psych: Mood is stable, no new changes  Extremities: No lower extremity edema  All other systems were reviewed with the patient and are negative.  I have reviewed the past medical history, past surgical history, social history and family history with the patient and they are unchanged from previous note.  ALLERGIES:  has No Known Allergies.  MEDICATIONS:  Current Outpatient Prescriptions  Medication Sig Dispense Refill  . b complex vitamins tablet Take 1 tablet by mouth daily.    . diphenhydrAMINE (BENADRYL) 25 MG tablet Take 25 mg by mouth as needed.    Nyoka Cowden Tea 150 MG CAPS Take by mouth.    Marland Kitchen HYDROcodone-acetaminophen (NORCO/VICODIN) 5-325 MG tablet Take 1-2 tablets by mouth every 4 (four) hours as needed. 15 tablet 0  . ibuprofen (ADVIL,MOTRIN) 200 MG tablet Take 200 mg by mouth as needed.    Marland Kitchen levothyroxine (SYNTHROID, LEVOTHROID) 112 MCG tablet TAKE ONE (1) TABLET BY MOUTH EVERY DAY BEFORE BREAKFAST 90 tablet 4  . loratadine (CLARITIN) 10 MG tablet Take 10 mg by mouth as needed for allergies.    . Multiple Vitamins-Minerals (MULTIVITAMINS THER. W/MINERALS) TABS tablet Take 1 tablet by mouth daily.    . Omega-3 Fatty Acids (FISH OIL) 1200 MG CAPS Take 1 capsule by mouth as needed.    Marland Kitchen omeprazole (PRILOSEC) 20 MG capsule TAKE ONE (1) CAPSULE EACH DAY 30 capsule 5  . vitamin C (ASCORBIC ACID) 500 MG tablet Take 500 mg by mouth daily.  No current facility-administered medications for this visit.     PHYSICAL EXAMINATION: ECOG PERFORMANCE STATUS: 1 - Symptomatic but completely ambulatory  Vitals:   03/03/17 1017  BP: 135/74  Pulse: 83  Resp: 18  Temp: 98.6 F (37 C)    Filed Weights   03/03/17 1017  Weight: 160 lb 1.6 oz (72.6 kg)    GENERAL:alert, no distress and comfortable SKIN: skin color, texture, turgor are normal, no rashes or significant lesions EYES: normal, Conjunctiva are pink and non-injected, sclera clear OROPHARYNX:no exudate, no erythema and lips, buccal mucosa, and tongue normal  NECK: supple, thyroid normal size, non-tender, without nodularity LYMPH:  no palpable lymphadenopathy in the cervical, axillary or inguinal LUNGS: clear to auscultation and percussion with normal breathing effort HEART: regular rate & rhythm and no murmurs and no lower extremity edema ABDOMEN:abdomen soft, non-tender and normal bowel sounds MUSCULOSKELETAL:no cyanosis of digits and no clubbing  NEURO: alert & oriented x 3 with fluent speech, no focal motor/sensory deficits EXTREMITIES: No lower extremity edema  LABORATORY DATA:  I have reviewed the data as listed   Chemistry      Component Value Date/Time   NA 142 01/26/2017 1221   K 4.8 01/26/2017 1221   CL 104 06/25/2014 1116   CO2 31 (H) 01/26/2017 1221   BUN 13.6 01/26/2017 1221   CREATININE 0.9 01/26/2017 1221      Component Value Date/Time   CALCIUM 9.8 01/26/2017 1221   ALKPHOS 84 01/26/2017 1221   AST 18 01/26/2017 1221   ALT 29 01/26/2017 1221   BILITOT 0.58 01/26/2017 1221       Lab Results  Component Value Date   WBC 6.7 01/26/2017   HGB 13.7 01/26/2017   HCT 40.8 01/26/2017   MCV 93.3 01/26/2017   PLT 259 01/26/2017   NEUTROABS 4.4 01/26/2017    ASSESSMENT & PLAN:  Malignant neoplasm of upper-outer quadrant of right breast in female, estrogen receptor positive ([place]) 02/17/2017 Right lumpectomy: Invasive ductal carcinoma, grade 2, 0.7 cm, DCIS intermediate grade, margins negative, lymphovascular invasion present, additional lateral margin residual IDC grade 2 with DCIS margins negative, 0/2 lymph nodes, T1b N0 stage IA ER 95%, PR 60%, HER-2 negative, Ki-67 12%  Pathology  counseling: I discussed the final pathology report of the patient provided  a copy of this report. I discussed the margins as well as lymph node surgeries. We also discussed the final staging along with previously performed ER/PR and HER-2/neu testing.  Recommendation: 1. Oncotype DX testing to determine if she would need systemic chemotherapy 2. adjuvant radiation therapy 3. Adjuvant antiestrogen therapy  Return to clinic based upon Oncotype DX test result   I spent 25 minutes talking to the patient of which more than half was spent in counseling and coordination of care.  No orders of the defined types were placed in this encounter.  The patient has a good understanding of the overall plan. she agrees with it. she will call with any problems that may develop before the next visit here.   Rulon Eisenmenger, MD 03/03/17

## 2017-03-03 NOTE — Assessment & Plan Note (Signed)
02/17/2017 Right lumpectomy: Invasive ductal carcinoma, grade 2, 0.7 cm, DCIS intermediate grade, margins negative, lymphovascular invasion present, additional lateral margin residual IDC grade 2 with DCIS margins negative, 0/2 lymph nodes, T1b N0 stage IA ER 95%, PR 60%, HER-2 negative, Ki-67 12%  Pathology counseling: I discussed the final pathology report of the patient provided  a copy of this report. I discussed the margins as well as lymph node surgeries. We also discussed the final staging along with previously performed ER/PR and HER-2/neu testing.  Recommendation: 1. Oncotype DX testing to determine if she would need systemic chemotherapy 2. adjuvant radiation therapy 3. Adjuvant antiestrogen therapy  Return to clinic based upon Oncotype DX test result

## 2017-03-03 NOTE — Telephone Encounter (Signed)
Received order for Oncotype testing. Requisition sent to pathology. Received by Varney Biles. PAC sent to [facility] and genomic health

## 2017-03-07 ENCOUNTER — Ambulatory Visit
Admission: RE | Admit: 2017-03-07 | Discharge: 2017-03-07 | Disposition: A | Discharge status: Home / Self Care | Payer: BLUE CROSS/BLUE SHIELD | Source: Ambulatory Visit | Attending: Radiation Oncology | Admitting: Radiation Oncology

## 2017-03-07 ENCOUNTER — Encounter: Payer: Self-pay | Admitting: Radiation Oncology

## 2017-03-07 DIAGNOSIS — C50411 Malignant neoplasm of upper-outer quadrant of right female breast: Secondary | ICD-10-CM

## 2017-03-07 DIAGNOSIS — Z170 Estrogen receptor positive status [ER+]: Principal | ICD-10-CM

## 2017-03-07 DIAGNOSIS — Z510 Encounter for antineoplastic radiation therapy: Secondary | ICD-10-CM | POA: Diagnosis not present

## 2017-03-07 NOTE — Progress Notes (Signed)
Please see the Nurse Progress Note in the MD Initial Consult Encounter for this patient. 

## 2017-03-07 NOTE — Progress Notes (Signed)
Radiation Oncology         [phone] ________________________________  Name: [name] Black MRN: [id number]  Date: 03/07/2017  DOB: 1951/04/11  Re-evaluation Note  CC: Celene Squibb, MD  Nicholas Lose, MD    ICD-10-CM   1. Malignant neoplasm of upper-outer quadrant of right breast in female, estrogen receptor positive ([place]) C50.411    Z17.0     Diagnosis:    Stage pT1b, pN0 Right Breast Invasive Ductal Carcinoma with DCIS, ER 95%, PR 60%, HER2- negative, grade 2  Narrative:  The patient returns today for follow-up and discussion of radiation therapy consideration. The patient was initially seen in consultation during the multidisciplinary breast clinic on 01/26/17.   Since that time, the patient underwent right breast partial mastectomy with axillary sentinel lymph node biopsy with Dr. Donne Hazel on 02/17/17. Surgical pathology revealed invasive ductal carcinoma, grade 2, spanning 0.7 cm, with intermediate grade DCIS. Posterior and medial margins uninvolved. Additional excision of the right lateral margin showed residual invasive ductal carcinoma, grade 2, and DCIS. Of 2 lymph nodes sampled, none were positive for metastatic disease.   The patient has consulted with Dr. Lindi Adie. Per his note, the patient will begin antiestrogen therapy following adjuvant radiation therapy. He plans to order Oncotype Dx testing.  The patient presents to the clinic today to discuss the role that radition therapy may play in the treatment of her disease. She is joined by her husband today.  On review of systems, the patient reports pain to her right upper arm which began approximately 2 weeks ago. She describes the pain as stinging, "like a bee sting." She reports she discussed this with Dr. Lucia Gaskins last week, who said a "nerve may have been nicked." If she props her arm up on a pillow, she can avoid this pain, but if her arm hangs by her side for some time and the pain begins, elevating her arm does not then  alleviate the pain. She reports good range of motion in her right arm, and she has been participating in PT.  ALLERGIES:  has No Known Allergies.  Meds: Current Outpatient Prescriptions  Medication Sig Dispense Refill  . b complex vitamins tablet Take 1 tablet by mouth daily.    . calcium gluconate 500 MG tablet Take 1 tablet by mouth daily.    . diphenhydrAMINE (BENADRYL) 25 MG tablet Take 25 mg by mouth as needed.    Nyoka Cowden Tea 150 MG CAPS Take by mouth.    Marland Kitchen ibuprofen (ADVIL,MOTRIN) 200 MG tablet Take 200 mg by mouth as needed.    Marland Kitchen levothyroxine (SYNTHROID, LEVOTHROID) 112 MCG tablet TAKE ONE (1) TABLET BY MOUTH EVERY DAY BEFORE BREAKFAST 90 tablet 4  . loratadine (CLARITIN) 10 MG tablet Take 10 mg by mouth as needed for allergies.    . Multiple Vitamins-Minerals (MULTIVITAMINS THER. W/MINERALS) TABS tablet Take 1 tablet by mouth daily.    . Omega-3 Fatty Acids (FISH OIL) 1200 MG CAPS Take 1 capsule by mouth as needed.    Marland Kitchen omeprazole (PRILOSEC) 20 MG capsule TAKE ONE (1) CAPSULE EACH DAY 30 capsule 5  . vitamin C (ASCORBIC ACID) 500 MG tablet Take 500 mg by mouth daily.    Marland Kitchen HYDROcodone-acetaminophen (NORCO/VICODIN) 5-325 MG tablet Take 1-2 tablets by mouth every 4 (four) hours as needed. (Patient not taking: Reported on 03/07/2017) 15 tablet 0   No current facility-administered medications for this encounter.    REVIEW OF SYSTEMS: A 10+ POINT REVIEW OF SYSTEMS WAS  OBTAINED including neurology, dermatology, psychiatry, cardiac, respiratory, lymph, extremities, GI, GU, musculoskeletal, constitutional, reproductive, HEENT. All pertinent positives are noted in the HPI. All others are negative.  Physical Findings: The patient is in no acute distress. Patient is alert and oriented.  height is 5' 6"  (1.676 m) and weight is 162 lb 3.2 oz (73.6 kg). Her oral temperature is 98.3 F (36.8 C). Her blood pressure is 161/85 (abnormal) and her pulse is 81. Her oxygen saturation is 99%.  No  significant changes. General: Alert and oriented, in no acute distress. Neck: Neck is supple, no palpable cervical or supraclavicular lymphadenopathy. Heart: Regular in rate and rhythm with no murmurs. Lungs clear to auscultation Breast: Left breast shows no palpable masses or nipple discharge. Right breast shows a periareolar scar located along the lateral aspect of the areolar border, which is healing well without signs of drainage or infection. The patient has a separate scar in axillary region which is also healing well without signs of drainage or infection.  Lab Findings: Lab Results  Component Value Date   WBC 6.7 01/26/2017   HGB 13.7 01/26/2017   HCT 40.8 01/26/2017   MCV 93.3 01/26/2017   PLT 259 01/26/2017    Radiographic Findings: Mm Breast Surgical Specimen  Result Date: 02/17/2017 CLINICAL DATA:  Evaluate surgical specimen containing radioactive seed following right lumpectomy for breast cancer. EXAM: SPECIMEN RADIOGRAPH OF THE RIGHT BREAST COMPARISON:  Previous exam(s). FINDINGS: Status post excision of the right breast. The radioactive seed and biopsy marker clip are present, completely intact, and were marked for pathology. IMPRESSION: Specimen radiograph of the right breast. Electronically Signed   By: Margarette Canada M.D.   On: 02/17/2017 11:13   Mm Rt Radioactive Seed Loc Mammo Guide  Result Date: 02/16/2017 CLINICAL DATA:  66 year old female with recently diagnosed invasive ductal carcinoma post ultrasound-guided biopsy of a mass in the right breast at the 9:30 position. EXAM: MAMMOGRAPHIC GUIDED RADIOACTIVE SEED LOCALIZATION OF THE RIGHT BREAST COMPARISON:  Previous exam(s). FINDINGS: Patient presents for radioactive seed localization prior to right breast lumpectomy. I met with the patient and we discussed the procedure of seed localization including benefits and alternatives. We discussed the high likelihood of a successful procedure. We discussed the risks of the procedure  including infection, bleeding, tissue injury and further surgery. We discussed the low dose of radioactivity involved in the procedure. Informed, written consent was given. The usual time-out protocol was performed immediately prior to the procedure. Using mammographic guidance, sterile technique, 1% lidocaine and an I-125 radioactive seed, the ribbon shaped biopsy marking clip with associated mass was localized using a lateral to medial approach. The follow-up mammogram images confirm the seed in the expected location and were marked for Dr. Donne Hazel. Follow-up survey of the patient confirms presence of the radioactive seed. Order number of I-125 seed:  [id number]. Total activity:  0.177 millicuries  Reference Date: 02/10/2017 The patient tolerated the procedure well and was released from the [place]. She was given instructions regarding seed removal. IMPRESSION: Radioactive seed localization right breast. No apparent complications. Electronically Signed   By: Everlean Alstrom M.D.   On: 02/16/2017 15:04    Impression:  [name] Black is a 66 y.o. woman with  Stage pT1b, pN0 Right Breast Invasive Ductal Carcinoma with DCIS, ER 95%, PR 60%, HER2- negative, grade 2, status post right lumpectomy. Today, I talked to the patient and her husband about the findings and work-up thus far.  We discussed the natural history of  breast cancer and general treatment, highlighting the role of radiotherapy in the management.  We discussed the available radiation techniques, and focused on the details of logistics and delivery.  We reviewed the anticipated acute and late sequelae associated with radiation in this setting.  The patient was encouraged to ask questions that I answered to the best of my ability. The patient is a good candidate for hypofractionated treatment given her breast size and contour. I would also recommend a boost to the lumpectomy cavity given the close surgical margins.  The patient would like to  proceed with radiation and will be scheduled for CT simulation. A consent form was discussed and signed today.  Plan:  The patient is scheduled for CT simulation and treatment planning on 03/22/17 at 1pm; the patient will tentatively begin treatment one week following planning. The patient did meet with Dr. Lindi Adie and he has recommended Oncotype Dx testing; if the patient turns out to be high risk she will proceed with adjuvant chemotherapy and her simulation will be postponed.    -----------------------------------  Blair Promise, PhD, MD  This document serves as a record of services personally performed by Gery Pray, MD. It was created on his behalf by Maryla Morrow, a trained medical scribe. The creation of this record is based on the scribe's personal observations and the provider's statements to them. This document has been checked and approved by the attending provider.

## 2017-03-14 ENCOUNTER — Telehealth: Payer: Self-pay | Admitting: *Deleted

## 2017-03-14 NOTE — Telephone Encounter (Signed)
Received oncotype score of 25/16%. Physician team notified. Pt scheduled and confirmed appt to discuss results with Dr. Lindi Adie on 03/15/17 at 1045.

## 2017-03-15 ENCOUNTER — Encounter: Payer: Self-pay | Admitting: Hematology and Oncology

## 2017-03-15 ENCOUNTER — Ambulatory Visit (HOSPITAL_BASED_OUTPATIENT_CLINIC_OR_DEPARTMENT_OTHER): Payer: BLUE CROSS/BLUE SHIELD | Admitting: Hematology and Oncology

## 2017-03-15 ENCOUNTER — Encounter (HOSPITAL_COMMUNITY): Payer: Self-pay

## 2017-03-15 ENCOUNTER — Ambulatory Visit: Payer: BLUE CROSS/BLUE SHIELD | Attending: General Surgery | Admitting: Physical Therapy

## 2017-03-15 DIAGNOSIS — M25611 Stiffness of right shoulder, not elsewhere classified: Secondary | ICD-10-CM

## 2017-03-15 DIAGNOSIS — C50411 Malignant neoplasm of upper-outer quadrant of right female breast: Secondary | ICD-10-CM | POA: Diagnosis not present

## 2017-03-15 DIAGNOSIS — R29898 Other symptoms and signs involving the musculoskeletal system: Secondary | ICD-10-CM | POA: Insufficient documentation

## 2017-03-15 DIAGNOSIS — Z170 Estrogen receptor positive status [ER+]: Secondary | ICD-10-CM

## 2017-03-15 NOTE — Progress Notes (Addendum)
Patient Care Team: Celene Squibb, MD as PCP - General (Internal Medicine) Brien Few, MD as Consulting Physician (Obstetrics and Gynecology) Rolm Bookbinder, MD as Consulting Physician (General Surgery) Nicholas Lose, MD as Consulting Physician (Hematology and Oncology) Gery Pray, MD as Consulting Physician (Radiation Oncology)  DIAGNOSIS:  Encounter Diagnosis  Name Primary?  . 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%, PR 60%, HER-2 negative ratio 1.38, Ki-67 12%, T1aN0 stage IA clinical stage      02/17/2017 Surgery    Right lumpectomy: Invasive ductal carcinoma, grade 2, 0.7 cm, DCIS intermediate grade, margins negative, lymphovascular invasion present, additional lateral margin residual IDC grade 2 with DCIS margins negative, 0/2 lymph nodes, T1b N0 stage IA ER 95%, PR 60%, HER-2 negative, Ki-67 12%      03/15/2017 Oncotype testing    Oncotype DX score 25, intermediate risk: 16% ROR with hormonal therapy alone      CHIEF COMPLIANT: Follow-up to discuss Oncotype DX score  INTERVAL HISTORY: [name] Black is a 66 year old with above-mentioned history of right breast cancer treated with lumpectomy adjuvant Oncotype DX testing and is here today to discuss the results. The Oncotype score came back at 25. This translates into a 16% risk of recurrence with hormonal therapy alone.   REVIEW OF SYSTEMS:   Constitutional: Denies fevers, chills or abnormal weight loss Eyes: Denies blurriness of vision Ears, nose, mouth, throat, and face: Denies mucositis or sore throat Respiratory: Denies cough, dyspnea or  wheezes Cardiovascular: Denies palpitation, chest discomfort Gastrointestinal:  Denies nausea, heartburn or change in bowel habits Skin: Denies abnormal skin rashes Lymphatics: Denies new lymphadenopathy or easy bruising Neurological:Denies numbness, tingling or new weaknesses Behavioral/Psych: Mood is stable, no new changes  Extremities: No lower extremity edema Breast:  denies any pain or lumps or nodules in either breasts All other systems were reviewed with the patient and are negative.  I have reviewed the past medical history, past surgical history, social history and family history with the patient and they are unchanged from previous note.  ALLERGIES:  has No Known Allergies.  MEDICATIONS:  Current Outpatient Prescriptions  Medication Sig Dispense Refill  . b complex vitamins tablet Take 1 tablet by mouth daily.    . calcium gluconate 500 MG tablet Take 1 tablet by mouth daily.    . diphenhydrAMINE (BENADRYL) 25 MG tablet Take 25 mg by mouth as needed.    Nyoka Cowden Tea 150 MG CAPS Take by mouth.    Marland Kitchen HYDROcodone-acetaminophen (NORCO/VICODIN) 5-325 MG tablet Take 1-2 tablets by mouth every 4 (four) hours as needed. (Patient not taking: Reported on 03/07/2017) 15 tablet 0  . ibuprofen (ADVIL,MOTRIN) 200 MG tablet Take 200 mg by mouth as needed.    Marland Kitchen levothyroxine (SYNTHROID, LEVOTHROID) 112 MCG tablet TAKE ONE (1) TABLET BY MOUTH EVERY DAY BEFORE BREAKFAST 90 tablet 4  . loratadine (CLARITIN) 10 MG tablet Take 10 mg by mouth as needed for allergies.    . Multiple Vitamins-Minerals (MULTIVITAMINS THER. W/MINERALS) TABS tablet Take 1 tablet by mouth daily.    . Omega-3 Fatty Acids (FISH OIL) 1200 MG CAPS Take 1 capsule by mouth as needed.    Marland Kitchen  omeprazole (PRILOSEC) 20 MG capsule TAKE ONE (1) CAPSULE EACH DAY 30 capsule 5  . vitamin C (ASCORBIC ACID) 500 MG tablet Take 500 mg by mouth daily.     No current facility-administered medications for this visit.     PHYSICAL  EXAMINATION: ECOG PERFORMANCE STATUS: 1 - Symptomatic but completely ambulatory  Vitals:   03/15/17 1133  BP: 134/74  Pulse: 81  Resp: 18  Temp: 98.4 F (36.9 C)   Filed Weights   03/15/17 1133  Weight: 161 lb 14.4 oz (73.4 kg)    GENERAL:alert, no distress and comfortable SKIN: skin color, texture, turgor are normal, no rashes or significant lesions EYES: normal, Conjunctiva are pink and non-injected, sclera clear OROPHARYNX:no exudate, no erythema and lips, buccal mucosa, and tongue normal  NECK: supple, thyroid normal size, non-tender, without nodularity LYMPH:  no palpable lymphadenopathy in the cervical, axillary or inguinal LUNGS: clear to auscultation and percussion with normal breathing effort HEART: regular rate & rhythm and no murmurs and no lower extremity edema ABDOMEN:abdomen soft, non-tender and normal bowel sounds MUSCULOSKELETAL:no cyanosis of digits and no clubbing  NEURO: alert & oriented x 3 with fluent speech, no focal motor/sensory deficits EXTREMITIES: No lower extremity edema  LABORATORY DATA:  I have reviewed the data as listed   Chemistry      Component Value Date/Time   NA 142 01/26/2017 1221   K 4.8 01/26/2017 1221   CL 104 06/25/2014 1116   CO2 31 (H) 01/26/2017 1221   BUN 13.6 01/26/2017 1221   CREATININE 0.9 01/26/2017 1221      Component Value Date/Time   CALCIUM 9.8 01/26/2017 1221   ALKPHOS 84 01/26/2017 1221   AST 18 01/26/2017 1221   ALT 29 01/26/2017 1221   BILITOT 0.58 01/26/2017 1221       Lab Results  Component Value Date   WBC 6.7 01/26/2017   HGB 13.7 01/26/2017   HCT 40.8 01/26/2017   MCV 93.3 01/26/2017   PLT 259 01/26/2017   NEUTROABS 4.4 01/26/2017    ASSESSMENT & PLAN:  Malignant neoplasm of upper-outer quadrant of right breast in female, estrogen receptor positive ([place]) 02/17/2017 Right lumpectomy: Invasive ductal carcinoma, grade 2, 0.7 cm, DCIS intermediate grade, margins negative, lymphovascular invasion  present, additional lateral margin residual IDC grade 2 with DCIS margins negative, 0/2 lymph nodes, T1b N0 stage IA ER 95%, PR 60%, HER-2 negative, Ki-67 12%  Oncotype DX score 25  Recommendation: 1. +/-Adjuvant Taxotere and Cytoxan every 3 weeks 4 2. followed by adjuvant radiation 3. Followed by adjuvant antiestrogen therapy  Chemotherapy Counseling: I discussed the risks and benefits of chemotherapy including the risks of nausea/ vomiting, risk of infection from low WBC count, fatigue due to chemo or anemia, bruising or bleeding due to low platelets, mouth sores, loss/ change in taste and decreased appetite. Liver and kidney function will be monitored through out chemotherapy as abnormalities in liver and kidney function may be a side effect of treatment. Risk of permanent bone marrow dysfunction and leukemia due to chemo were also discussed.  TAILORx: I discussed the recently presented and published TAILORx study where patients with intermediate risk Oncotype score were randomized to chemotherapy versus observation. The study revealed that the group that was between recurrence score of 11-25 did not benefit from chemotherapy. The endocrine therapy alone was non-inferior to endocrine therapy ET plus chemotherapy with a hazard ratio 1.08 and 9 yr iDFS was 83.3% for ET and 84.3% for ET+ chemotherapy  Based on  the above information, patient decided not to receive chemotherapy and will proceed with radiation. I will see her back after radiation is complete. I spent 25 minutes talking to the patient of which more than half was spent in counseling and coordination of care.  No orders of the defined types were placed in this encounter.  The patient has a good understanding of the overall plan. she agrees with it. she will call with any problems that may develop before the next visit here.   Rulon Eisenmenger, MD 03/15/17

## 2017-03-15 NOTE — Assessment & Plan Note (Signed)
02/17/2017 Right lumpectomy: Invasive ductal carcinoma, grade 2, 0.7 cm, DCIS intermediate grade, margins negative, lymphovascular invasion present, additional lateral margin residual IDC grade 2 with DCIS margins negative, 0/2 lymph nodes, T1b N0 stage IA ER 95%, PR 60%, HER-2 negative, Ki-67 12%  Oncotype DX score 25  Recommendation: 1. Adjuvant Taxotere and Cytoxan every 3 weeks 4 2. followed by adjuvant radiation 3. Followed by adjuvant antiestrogen therapy  Plan: 1. Port placement 2. chemotherapy class 3. Start chemotherapy in 2 weeks

## 2017-03-15 NOTE — Therapy (Signed)
[place], [place], [address] Phone: [phone]   Fax:  [phone]  Physical Therapy Evaluation  Patient Details  Name: [name] Black MRN: [id number] Date of Birth: [birth year] Referring Provider: Dr. Lucretia Field  Encounter Date: 03/15/2017      PT End of Session - 03/15/17 1317    Visit Number 1   Number of Visits 1   PT Start Time 0845   PT Stop Time 0935   PT Time Calculation (min) 50 min   Activity Tolerance Patient tolerated treatment well   Behavior During Therapy [facility] for tasks assessed/performed      Past Medical History:  Diagnosis Date  . Cancer ([place]) 01/2017   right breast cancer  . GERD (gastroesophageal reflux disease)   . H/O seasonal allergies   . Hypothyroidism     Past Surgical History:  Procedure Laterality Date  . COLONOSCOPY    . COLONOSCOPY N/A 01/09/2014   Procedure: COLONOSCOPY;  Surgeon: Rogene Houston, MD;  Location[facility];  Service: Endoscopy;  Laterality: N/A;  1030  . Cyst removed from left knee    . Fibroid removed and cyst removed from ovary    . RADIOACTIVE SEED GUIDED MASTECTOMY WITH AXILLARY SENTINEL LYMPH NODE BIOPSY Right 02/17/2017   Procedure: RADIOACTIVE SEED GUIDED PARTIAL MASTECTOMY WITH AXILLARY SENTINEL LYMPH NODE BIOPSY;  Surgeon: Rolm Bookbinder, MD;  Location: [facility];  Service: General;  Laterality: Right;  . Right knee arthroscopy      There were no vitals filed for this visit.       Subjective Assessment - 03/15/17 0848    Subjective After surgery, had significant pain in right upper arm and axilla; couldn't tolerate having clothing touch it or anything.  It has gotten somewhat better, but still hurts. Says it's 50% less than when she saw Dr. Donne Hazel. Was concerned about whether she was doing her exercises wrong. Dr. Donne Hazel said the nerve was stretched during surgery. Is here for desensitization.   Pertinent History Patient was diagnosed on 01/11/17 with right grade 2 invasive ductal carcinoma breast cancer. It measures 5 mm and is located in the upper outer quadrant. It is ER/PR positive and HER2 negative with a Ki67 of 12%.  Lumpectoomy with SLNB on 02/17/17; will have XRT and has simulation scheduled 03/22/17. Will meet with Dr. Lindi Adie to find out if she will need chemo. Hypothyroid, acid reflux. Chronic back pain for which she uses an inversion table and sees a chiropractor.   Patient Stated Goals desensitization of painful area and get arm ready for radiation   Currently in Pain? Yes   Pain Score 6   up to 8/10   Pain Location Axilla  inferior axilla, right upper posterior arm   Pain Orientation Right   Pain Descriptors / Indicators Sharp;Stabbing   Pain Type Surgical pain   Aggravating Factors  arm against body   Pain Relieving Factors keeping arm away from body, ibuprofen, wearing cotton, moving the arm            [facility] PT Assessment - 03/15/17 0001      Assessment   Medical Diagnosis Right breast cancer   Referring Provider Dr. Lucretia Field   Onset Date/Surgical Date 02/17/17   Hand Dominance Right   Prior Therapy none     Precautions   Precaution Comments cancer precautions     Restrictions   Weight Bearing Restrictions No     Balance Screen  Has the patient fallen in the past 6 months No   Has the patient had a decrease in activity level because of a fear of falling?  No   Is the patient reluctant to leave their home because of a fear of falling?  No     Home Ecologist residence   Living Arrangements Spouse/significant other   Type of [place] Multi-level     Prior Function   Level of Independence Independent   Vocation Self employed   Environmental health practitioner; does physical work   Leisure Does tai chi once a week at senior center, also does this at home 5 days a week; doing  shoulder ROM exercises every day     Cognition   Overall Cognitive Status Within Functional Limits for tasks assessed     Observation/Other Assessments   Observations unremarkable   Skin Integrity incisions almost fully healed for right breast at lateral aspect of nipple and at right axilla   Quick DASH  29.55     Posture/Postural Control   Postural Limitations Forward head;Rounded Shoulders     ROM / Strength   AROM / PROM / Strength PROM     AROM   Right Shoulder Extension 61 Degrees   Right Shoulder Flexion 143 Degrees  feels tinging in hand from that movement   Right Shoulder ABduction 160 Degrees   Right Shoulder Internal Rotation 59 Degrees  in supine, avoiding compensation   Right Shoulder External Rotation 66 Degrees  supine     PROM   Overall PROM Comments able to have her arm placed in approx. position for radiation, though it is achy   PROM Assessment Site Shoulder   Right/Left Shoulder Right   Right Shoulder Flexion 155 Degrees  arm pain   Right Shoulder ABduction 134 Degrees  felt stretching           LYMPHEDEMA/ONCOLOGY QUESTIONNAIRE - 03/15/17 0908      Type   Cancer Type Right breast cancer     Surgeries   Lumpectomy Date 02/17/17   Number Lymph Nodes Removed 2     Right Upper Extremity Lymphedema   10 cm Proximal to Olecranon Process 27 cm   Olecranon Process 23.8 cm   10 cm Proximal to Ulnar Styloid Process 20.7 cm   Just Proximal to Ulnar Styloid Process 14.9 cm   Across Hand at [employer] 19 cm   At [place] of 2nd Digit 5.8 cm           Quick Dash - 03/15/17 0001    Open a tight or new jar No difficulty   Do heavy household chores (wash walls, wash floors) Mild difficulty   Carry a shopping bag or briefcase Mild difficulty   Wash your back Moderate difficulty   Use a knife to cut food No difficulty   Recreational activities in which you take some force or impact through your arm, shoulder, or hand (golf, hammering, tennis)  Moderate difficulty   During the past week, to what extent has your arm, shoulder or hand problem interfered with your normal social activities with family, friends, neighbors, or groups? Slightly   During the past week, to what extent has your arm, shoulder or hand problem limited your work or other regular daily activities Slightly   Arm, shoulder, or hand pain. Moderate   Tingling (pins and needles) in your arm, shoulder, or hand Mild   Difficulty  Sleeping Moderate difficulty   DASH Score 29.55 %      Objective measurements completed on examination: See above findings.          [facility] Adult PT Treatment/Exercise - 03/15/17 0001      Self-Care   Self-Care Other Self-Care Comments   Other Self-Care Comments  instructed patient in progressive desensitization using softer to progressively less soft fabrics on the sensitive area as well as extending over onto areas with normal sensation, to be done a couple of times a day for a few minutes.  Also suggested that she wait to start this to see if pain spontaneously reduces further, since she has recently had a significant reduction in her discomfort.                PT Education - 03/15/17 1317    Education provided Yes   Education Details skin desensitization techniques; ABC class opportunity   Person(s) Educated Patient   Methods Explanation   Comprehension Verbalized understanding              Breast Clinic Goals - 01/26/17 1623      Patient will be able to verbalize understanding of pertinent lymphedema risk reduction practices relevant to her diagnosis specifically related to skin care.   Time 1   Period Days   Status Achieved     Patient will be able to return demonstrate and/or verbalize understanding of the post-op home exercise program related to regaining shoulder range of motion.   Time 1   Period Days   Status Achieved     Patient will be able to verbalize understanding of the importance of attending  the postoperative After Breast Cancer Class for further lymphedema risk reduction education and therapeutic exercise.   Time 1   Period Days   Status Achieved          Long Term Clinic Goals - 03/15/17 1347      CC Long Term Goal  #1   Title Pt. will be knowledgeable about more shoulder ROM exercises and about lymphedema risk reduction practices.   Time 2   Period Weeks  should achieve this at [facility] class   Status New             Plan - 03/15/17 1318    Clinical Impression Statement This is a pleasant woman 3 weeks s/p lumpectomy and SLNB for right breast cancer.  Her main complaint is of pain in right upper arm and axilla, though she has recently had a significant reduction in this discomfort.  Her AROM is fairly good for this soon post-op. She has some weakness in right shoulder. She has XRT sim in one week, and she is already able to attain the positioning of her arm that she will need, although it will probably be uncomfortable for her to be in this position for the set-up, unless she makes more significant progress before then.  She was encouraged today to come to [facility] class for education on exercise and lymphedema risk reduction, and she plans to come next week.  She has a low risk for lymphedema and shows slight differences in circumference measurements compared to pre-op. She was told how to do desensitization herself; she may wait a while longer before starting this, on the chance that discomfort will continue to improve spontaneously.   History and Personal Factors relevant to plan of care: chronic back pain   Clinical Presentation Evolving   Clinical Presentation due to: still healing from  surgery, will start XRT soon   Clinical Decision Making Moderate   Rehab Potential Excellent   PT Frequency One time visit  will add visits if need arises   PT Treatment/Interventions ADLs/Self Care Home Management;Therapeutic exercise;Patient/family education;Manual techniques;Passive  range of motion;Scar mobilization   PT Next Visit Plan Pt. plans to attend ABC class next week.  If she indicates that she has continued to improve at that time, we will not plan further therapy.  If she is not progressing independently, we will plan further therapy in that case.   PT Home Exercise Plan Post op shoulder ROM HEP; self-desensitization   Consulted and Agree with Plan of Care Patient      Patient will benefit from skilled therapeutic intervention in order to improve the following deficits and impairments:  Pain, Decreased range of motion, Decreased strength, Decreased knowledge of precautions  Visit Diagnosis: Stiffness of right shoulder, not elsewhere classified - Plan: PT plan of care cert/re-cert  Other symptoms and signs involving the musculoskeletal system - Plan: PT plan of care cert/re-cert     Problem List Patient Active Problem List   Diagnosis Date Noted  . Malignant neoplasm of upper-outer quadrant of right breast in female, estrogen receptor positive ([place]) 01/25/2017  . Prediabetes 07/11/2015  . Hypothyroidism 06/25/2014  . Abdominal pain, chronic, right upper quadrant 06/25/2014    Annalycia Done 03/15/2017, 1:52 PM  [place], [place], [address] Phone: [phone]   Fax:  [phone]  Name: [name] Black MRN: [id number] Date of Birth: July 13, 1951  Serafina Royals, PT 03/15/17 1:52 PM

## 2017-03-22 ENCOUNTER — Ambulatory Visit
Admission: RE | Admit: 2017-03-22 | Discharge: 2017-03-22 | Disposition: A | Discharge status: Home / Self Care | Payer: BLUE CROSS/BLUE SHIELD | Source: Ambulatory Visit | Attending: Radiation Oncology | Admitting: Radiation Oncology

## 2017-03-22 DIAGNOSIS — Z170 Estrogen receptor positive status [ER+]: Principal | ICD-10-CM

## 2017-03-22 DIAGNOSIS — C50411 Malignant neoplasm of upper-outer quadrant of right female breast: Secondary | ICD-10-CM

## 2017-03-22 DIAGNOSIS — Z510 Encounter for antineoplastic radiation therapy: Secondary | ICD-10-CM | POA: Diagnosis not present

## 2017-03-22 NOTE — Progress Notes (Signed)
  Radiation Oncology         [phone] ________________________________  Name: [name] Black MRN: [id number]  Date: 03/22/2017  DOB: [birth year]  SIMULATION AND TREATMENT PLANNING NOTE    ICD-10-CM   1. Malignant neoplasm of upper-outer quadrant of right breast in female, estrogen receptor positive ([place]) C50.411    Z17.0      Diagnosis: Stage pT1b, pN0Right Breast Invasive Ductal Carcinoma with DCIS, ER 95%, PR 60%, HER2-negative, grade 2    NARRATIVE:  The patient was brought to the [facility].  Identity was confirmed.  All relevant records and images related to the planned course of therapy were reviewed.  The patient freely provided informed written consent to proceed with treatment after reviewing the details related to the planned course of therapy. The consent form was witnessed and verified by the simulation staff.  Then, the patient was set-up in a stable reproducible  supine position for radiation therapy.  CT images were obtained.  Surface markings were placed.  The CT images were loaded into the planning software.  Then the target and avoidance structures were contoured.  Treatment planning then occurred.  The radiation prescription was entered and confirmed.  Then, I designed and supervised the construction of a total of 3 medically necessary complex treatment devices.  I have requested : 3D Simulation  I have requested a DVH of the following structures: Heart, lungs, lumpectomy cavity.  I have ordered:CBC  PLAN:  The patient will receive 42.72 Gy in 16 fractions followed by a boost to the lumpectomy cavity of 10 gray in 5 fractions for a cumulative dose of 52.72 gray. Patient will be treated with hypo-fractionated accelerated radiation therapy.  -----------------------------------  Blair Promise, PhD, MD

## 2017-03-23 DIAGNOSIS — Z510 Encounter for antineoplastic radiation therapy: Secondary | ICD-10-CM | POA: Diagnosis not present

## 2017-03-28 ENCOUNTER — Telehealth: Payer: Self-pay | Admitting: Hematology and Oncology

## 2017-03-28 NOTE — Telephone Encounter (Signed)
lvm to inform pt of 8/21 appt at 215 pm per sch msg

## 2017-03-29 ENCOUNTER — Ambulatory Visit
Admission: RE | Admit: 2017-03-29 | Discharge: 2017-03-29 | Disposition: A | Discharge status: Home / Self Care | Payer: BLUE CROSS/BLUE SHIELD | Source: Ambulatory Visit | Attending: Radiation Oncology | Admitting: Radiation Oncology

## 2017-03-29 DIAGNOSIS — C50411 Malignant neoplasm of upper-outer quadrant of right female breast: Secondary | ICD-10-CM

## 2017-03-29 DIAGNOSIS — Z170 Estrogen receptor positive status [ER+]: Principal | ICD-10-CM

## 2017-03-29 DIAGNOSIS — Z510 Encounter for antineoplastic radiation therapy: Secondary | ICD-10-CM | POA: Diagnosis not present

## 2017-03-30 ENCOUNTER — Ambulatory Visit
Admission: RE | Admit: 2017-03-30 | Discharge: 2017-03-30 | Disposition: A | Discharge status: Home / Self Care | Payer: BLUE CROSS/BLUE SHIELD | Source: Ambulatory Visit | Attending: Radiation Oncology | Admitting: Radiation Oncology

## 2017-03-30 ENCOUNTER — Inpatient Hospital Stay
Admission: RE | Admit: 2017-03-30 | Discharge: 2017-03-30 | Disposition: A | Discharge status: Home / Self Care | Payer: Self-pay | Source: Ambulatory Visit | Attending: Radiation Oncology | Admitting: Radiation Oncology

## 2017-03-30 DIAGNOSIS — Z170 Estrogen receptor positive status [ER+]: Principal | ICD-10-CM

## 2017-03-30 DIAGNOSIS — Z510 Encounter for antineoplastic radiation therapy: Secondary | ICD-10-CM | POA: Diagnosis not present

## 2017-03-30 DIAGNOSIS — C50411 Malignant neoplasm of upper-outer quadrant of right female breast: Secondary | ICD-10-CM

## 2017-03-30 MED ORDER — RADIAPLEXRX EX GEL
Freq: Once | CUTANEOUS | Status: AC
  Administered 2017-03-30: 13:00:00 via TOPICAL

## 2017-03-30 NOTE — Progress Notes (Signed)
Pt here for patient teaching.  Pt given Radiation and You booklet, skin care instructions and Radiaplex gel.  Reviewed areas of pertinence such as fatigue, skin changes, breast tenderness and breast swelling . Pt able to give teach back of to pat skin and use unscented/gentle soap,apply Radiaplex bid and avoid applying anything to skin within 4 hours of treatment. Pt demonstrated understanding and verbalizes understanding of information given and will contact nursing with any questions or concerns.    Patient requested an extra tube of radiaplex today due to an upcoming change in her  insurance coverage.

## 2017-03-31 ENCOUNTER — Ambulatory Visit
Admission: RE | Admit: 2017-03-31 | Discharge: 2017-03-31 | Disposition: A | Discharge status: Home / Self Care | Payer: BLUE CROSS/BLUE SHIELD | Source: Ambulatory Visit | Attending: Radiation Oncology | Admitting: Radiation Oncology

## 2017-03-31 DIAGNOSIS — Z510 Encounter for antineoplastic radiation therapy: Secondary | ICD-10-CM | POA: Diagnosis not present

## 2017-04-01 ENCOUNTER — Ambulatory Visit
Admission: RE | Admit: 2017-04-01 | Discharge: 2017-04-01 | Disposition: A | Discharge status: Home / Self Care | Payer: BLUE CROSS/BLUE SHIELD | Source: Ambulatory Visit | Attending: Radiation Oncology | Admitting: Radiation Oncology

## 2017-04-01 DIAGNOSIS — Z510 Encounter for antineoplastic radiation therapy: Secondary | ICD-10-CM | POA: Diagnosis not present

## 2017-04-04 ENCOUNTER — Ambulatory Visit
Admission: RE | Admit: 2017-04-04 | Discharge: 2017-04-04 | Disposition: A | Discharge status: Home / Self Care | Payer: BLUE CROSS/BLUE SHIELD | Source: Ambulatory Visit | Attending: Radiation Oncology | Admitting: Radiation Oncology

## 2017-04-04 DIAGNOSIS — Z510 Encounter for antineoplastic radiation therapy: Secondary | ICD-10-CM | POA: Diagnosis not present

## 2017-04-05 ENCOUNTER — Ambulatory Visit
Admission: RE | Admit: 2017-04-05 | Discharge: 2017-04-05 | Disposition: A | Discharge status: Home / Self Care | Payer: BLUE CROSS/BLUE SHIELD | Source: Ambulatory Visit | Attending: Radiation Oncology | Admitting: Radiation Oncology

## 2017-04-05 DIAGNOSIS — Z510 Encounter for antineoplastic radiation therapy: Secondary | ICD-10-CM | POA: Diagnosis not present

## 2017-04-06 ENCOUNTER — Ambulatory Visit
Admission: RE | Admit: 2017-04-06 | Discharge: 2017-04-06 | Disposition: A | Discharge status: Home / Self Care | Payer: BLUE CROSS/BLUE SHIELD | Source: Ambulatory Visit | Attending: Radiation Oncology | Admitting: Radiation Oncology

## 2017-04-06 DIAGNOSIS — Z87891 Personal history of nicotine dependence: Secondary | ICD-10-CM | POA: Diagnosis not present

## 2017-04-06 DIAGNOSIS — Z79899 Other long term (current) drug therapy: Secondary | ICD-10-CM | POA: Diagnosis not present

## 2017-04-06 DIAGNOSIS — Z170 Estrogen receptor positive status [ER+]: Secondary | ICD-10-CM | POA: Diagnosis not present

## 2017-04-06 DIAGNOSIS — Z510 Encounter for antineoplastic radiation therapy: Secondary | ICD-10-CM | POA: Diagnosis not present

## 2017-04-06 DIAGNOSIS — C50411 Malignant neoplasm of upper-outer quadrant of right female breast: Secondary | ICD-10-CM | POA: Diagnosis not present

## 2017-04-06 DIAGNOSIS — Z809 Family history of malignant neoplasm, unspecified: Secondary | ICD-10-CM | POA: Diagnosis not present

## 2017-04-06 DIAGNOSIS — E039 Hypothyroidism, unspecified: Secondary | ICD-10-CM | POA: Diagnosis not present

## 2017-04-06 DIAGNOSIS — K219 Gastro-esophageal reflux disease without esophagitis: Secondary | ICD-10-CM | POA: Diagnosis not present

## 2017-04-07 ENCOUNTER — Ambulatory Visit
Admission: RE | Admit: 2017-04-07 | Discharge: 2017-04-07 | Disposition: A | Discharge status: Home / Self Care | Payer: BLUE CROSS/BLUE SHIELD | Source: Ambulatory Visit | Attending: Radiation Oncology | Admitting: Radiation Oncology

## 2017-04-07 DIAGNOSIS — C50411 Malignant neoplasm of upper-outer quadrant of right female breast: Secondary | ICD-10-CM | POA: Diagnosis not present

## 2017-04-07 DIAGNOSIS — Z510 Encounter for antineoplastic radiation therapy: Secondary | ICD-10-CM | POA: Diagnosis not present

## 2017-04-08 ENCOUNTER — Ambulatory Visit
Admission: RE | Admit: 2017-04-08 | Discharge: 2017-04-08 | Disposition: A | Discharge status: Home / Self Care | Payer: BLUE CROSS/BLUE SHIELD | Source: Ambulatory Visit | Attending: Radiation Oncology | Admitting: Radiation Oncology

## 2017-04-08 DIAGNOSIS — Z510 Encounter for antineoplastic radiation therapy: Secondary | ICD-10-CM | POA: Diagnosis not present

## 2017-04-08 DIAGNOSIS — C50411 Malignant neoplasm of upper-outer quadrant of right female breast: Secondary | ICD-10-CM | POA: Diagnosis not present

## 2017-04-11 ENCOUNTER — Ambulatory Visit
Admission: RE | Admit: 2017-04-11 | Discharge: 2017-04-11 | Disposition: A | Discharge status: Home / Self Care | Payer: BLUE CROSS/BLUE SHIELD | Source: Ambulatory Visit | Attending: Radiation Oncology | Admitting: Radiation Oncology

## 2017-04-11 DIAGNOSIS — C50411 Malignant neoplasm of upper-outer quadrant of right female breast: Secondary | ICD-10-CM | POA: Diagnosis not present

## 2017-04-11 DIAGNOSIS — Z510 Encounter for antineoplastic radiation therapy: Secondary | ICD-10-CM | POA: Diagnosis not present

## 2017-04-12 ENCOUNTER — Ambulatory Visit
Admission: RE | Admit: 2017-04-12 | Discharge: 2017-04-12 | Disposition: A | Discharge status: Home / Self Care | Payer: BLUE CROSS/BLUE SHIELD | Source: Ambulatory Visit | Attending: Radiation Oncology | Admitting: Radiation Oncology

## 2017-04-12 DIAGNOSIS — C50411 Malignant neoplasm of upper-outer quadrant of right female breast: Secondary | ICD-10-CM | POA: Diagnosis not present

## 2017-04-12 DIAGNOSIS — Z510 Encounter for antineoplastic radiation therapy: Secondary | ICD-10-CM | POA: Diagnosis not present

## 2017-04-13 ENCOUNTER — Ambulatory Visit
Admission: RE | Admit: 2017-04-13 | Discharge: 2017-04-13 | Disposition: A | Discharge status: Home / Self Care | Payer: BLUE CROSS/BLUE SHIELD | Source: Ambulatory Visit | Attending: Radiation Oncology | Admitting: Radiation Oncology

## 2017-04-13 DIAGNOSIS — Z510 Encounter for antineoplastic radiation therapy: Secondary | ICD-10-CM | POA: Diagnosis not present

## 2017-04-13 DIAGNOSIS — C50411 Malignant neoplasm of upper-outer quadrant of right female breast: Secondary | ICD-10-CM | POA: Diagnosis not present

## 2017-04-14 ENCOUNTER — Ambulatory Visit
Admission: RE | Admit: 2017-04-14 | Discharge: 2017-04-14 | Disposition: A | Discharge status: Home / Self Care | Payer: BLUE CROSS/BLUE SHIELD | Source: Ambulatory Visit | Attending: Radiation Oncology | Admitting: Radiation Oncology

## 2017-04-14 DIAGNOSIS — C50411 Malignant neoplasm of upper-outer quadrant of right female breast: Secondary | ICD-10-CM | POA: Diagnosis not present

## 2017-04-14 DIAGNOSIS — Z510 Encounter for antineoplastic radiation therapy: Secondary | ICD-10-CM | POA: Diagnosis not present

## 2017-04-15 ENCOUNTER — Ambulatory Visit: Admission: RE | Admit: 2017-04-15 | Payer: BLUE CROSS/BLUE SHIELD | Source: Ambulatory Visit

## 2017-04-18 ENCOUNTER — Ambulatory Visit
Admission: RE | Admit: 2017-04-18 | Discharge: 2017-04-18 | Disposition: A | Discharge status: Home / Self Care | Payer: BLUE CROSS/BLUE SHIELD | Source: Ambulatory Visit | Attending: Radiation Oncology | Admitting: Radiation Oncology

## 2017-04-18 ENCOUNTER — Ambulatory Visit: Payer: BLUE CROSS/BLUE SHIELD | Admitting: Radiation Oncology

## 2017-04-18 DIAGNOSIS — C50411 Malignant neoplasm of upper-outer quadrant of right female breast: Secondary | ICD-10-CM | POA: Diagnosis not present

## 2017-04-18 DIAGNOSIS — Z510 Encounter for antineoplastic radiation therapy: Secondary | ICD-10-CM | POA: Diagnosis not present

## 2017-04-19 ENCOUNTER — Ambulatory Visit
Admission: RE | Admit: 2017-04-19 | Discharge: 2017-04-19 | Disposition: A | Discharge status: Home / Self Care | Payer: BLUE CROSS/BLUE SHIELD | Source: Ambulatory Visit | Attending: Radiation Oncology | Admitting: Radiation Oncology

## 2017-04-19 DIAGNOSIS — Z510 Encounter for antineoplastic radiation therapy: Secondary | ICD-10-CM | POA: Diagnosis not present

## 2017-04-19 DIAGNOSIS — C50411 Malignant neoplasm of upper-outer quadrant of right female breast: Secondary | ICD-10-CM | POA: Diagnosis not present

## 2017-04-20 ENCOUNTER — Ambulatory Visit: Payer: BLUE CROSS/BLUE SHIELD | Admitting: Radiation Oncology

## 2017-04-20 ENCOUNTER — Ambulatory Visit
Admission: RE | Admit: 2017-04-20 | Discharge: 2017-04-20 | Disposition: A | Discharge status: Home / Self Care | Payer: BLUE CROSS/BLUE SHIELD | Source: Ambulatory Visit | Attending: Radiation Oncology | Admitting: Radiation Oncology

## 2017-04-20 DIAGNOSIS — Z510 Encounter for antineoplastic radiation therapy: Secondary | ICD-10-CM | POA: Diagnosis not present

## 2017-04-20 DIAGNOSIS — C50411 Malignant neoplasm of upper-outer quadrant of right female breast: Secondary | ICD-10-CM | POA: Diagnosis not present

## 2017-04-21 ENCOUNTER — Ambulatory Visit: Payer: BLUE CROSS/BLUE SHIELD

## 2017-04-21 ENCOUNTER — Ambulatory Visit
Admission: RE | Admit: 2017-04-21 | Discharge: 2017-04-21 | Disposition: A | Discharge status: Home / Self Care | Payer: BLUE CROSS/BLUE SHIELD | Source: Ambulatory Visit | Attending: Radiation Oncology | Admitting: Radiation Oncology

## 2017-04-21 DIAGNOSIS — Z170 Estrogen receptor positive status [ER+]: Principal | ICD-10-CM

## 2017-04-21 DIAGNOSIS — C50411 Malignant neoplasm of upper-outer quadrant of right female breast: Secondary | ICD-10-CM | POA: Diagnosis not present

## 2017-04-21 DIAGNOSIS — Z510 Encounter for antineoplastic radiation therapy: Secondary | ICD-10-CM | POA: Diagnosis not present

## 2017-04-22 ENCOUNTER — Ambulatory Visit: Payer: BLUE CROSS/BLUE SHIELD

## 2017-04-22 ENCOUNTER — Ambulatory Visit
Admission: RE | Admit: 2017-04-22 | Discharge: 2017-04-22 | Disposition: A | Discharge status: Home / Self Care | Payer: BLUE CROSS/BLUE SHIELD | Source: Ambulatory Visit | Attending: Radiation Oncology | Admitting: Radiation Oncology

## 2017-04-22 DIAGNOSIS — Z510 Encounter for antineoplastic radiation therapy: Secondary | ICD-10-CM | POA: Diagnosis not present

## 2017-04-25 ENCOUNTER — Ambulatory Visit
Admission: RE | Admit: 2017-04-25 | Discharge: 2017-04-25 | Disposition: A | Discharge status: Home / Self Care | Payer: BLUE CROSS/BLUE SHIELD | Source: Ambulatory Visit | Attending: Radiation Oncology | Admitting: Radiation Oncology

## 2017-04-25 DIAGNOSIS — Z510 Encounter for antineoplastic radiation therapy: Secondary | ICD-10-CM | POA: Diagnosis not present

## 2017-04-26 ENCOUNTER — Encounter: Payer: Self-pay | Admitting: Hematology and Oncology

## 2017-04-26 ENCOUNTER — Ambulatory Visit (HOSPITAL_BASED_OUTPATIENT_CLINIC_OR_DEPARTMENT_OTHER): Payer: PPO | Admitting: Hematology and Oncology

## 2017-04-26 ENCOUNTER — Ambulatory Visit
Admission: RE | Admit: 2017-04-26 | Discharge: 2017-04-26 | Disposition: A | Discharge status: Home / Self Care | Payer: PPO | Source: Ambulatory Visit | Attending: Radiation Oncology | Admitting: Radiation Oncology

## 2017-04-26 VITALS — BP 142/86 | HR 106 | Temp 98.4°F | Resp 18 | Ht 66.0 in | Wt 163.7 lb

## 2017-04-26 DIAGNOSIS — C50411 Malignant neoplasm of upper-outer quadrant of right female breast: Secondary | ICD-10-CM

## 2017-04-26 DIAGNOSIS — Z780 Asymptomatic menopausal state: Secondary | ICD-10-CM

## 2017-04-26 DIAGNOSIS — Z79899 Other long term (current) drug therapy: Secondary | ICD-10-CM | POA: Diagnosis not present

## 2017-04-26 DIAGNOSIS — Z170 Estrogen receptor positive status [ER+]: Secondary | ICD-10-CM

## 2017-04-26 DIAGNOSIS — Z510 Encounter for antineoplastic radiation therapy: Secondary | ICD-10-CM | POA: Insufficient documentation

## 2017-04-26 MED ORDER — LETROZOLE 2.5 MG PO TABS: 2.5000 mg | ORAL_TABLET | Freq: Every day | ORAL | 3 refills | Status: DC

## 2017-04-26 NOTE — Addendum Note (Signed)
Addended by: Thelma Barge MAY J on: 04/26/2017 03:50 PM   Modules accepted: Orders

## 2017-04-26 NOTE — Progress Notes (Signed)
Patient Care Team: Celene Squibb, MD as PCP - General (Internal Medicine) Brien Few, MD as Consulting Physician (Obstetrics and Gynecology) Rolm Bookbinder, MD as Consulting Physician (General Surgery) Nicholas Lose, MD as Consulting Physician (Hematology and Oncology) Gery Pray, MD as Consulting Physician (Radiation Oncology)  DIAGNOSIS:  Encounter Diagnosis  Name Primary?  . 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%, HER-2 negative ratio 1.38, Ki-67 12%, T1aN0 stage IA clinical stage      02/17/2017 Surgery    Right lumpectomy: Invasive ductal carcinoma, grade 2, 0.7 cm, DCIS intermediate grade, margins negative, lymphovascular invasion present, additional lateral margin residual IDC grade 2 with DCIS margins negative, 0/2 lymph nodes, T1b N0 stage IA ER 95%, PR 60%, HER-2 negative, Ki-67 12%      03/15/2017 Oncotype testing    Oncotype DX score 25, intermediate risk: 16% ROR with hormonal therapy alone      03/30/2017 - 04/26/2017 Radiation Therapy     Adjuvant radiation therapy       CHIEF COMPLIANT:  Follow-up on radiation therapy  INTERVAL HISTORY: [name] Black  Stage 66-year-old with above-mentioned history of right breast cancer treated with lumpectomy and is currently on adjuvant radiation. She is currently going to boost and is slightly uncomfortable in the breast and axilla. Mild fatigue but otherwise doing quite well. She is using radiopaque on the radiation related skin changes.  REVIEW OF SYSTEMS:   Constitutional: Denies fevers, chills or abnormal weight loss Eyes: Denies blurriness of  vision Ears, nose, mouth, throat, and face: Denies mucositis or sore throat Respiratory: Denies cough, dyspnea or wheezes Cardiovascular: Denies palpitation, chest discomfort Gastrointestinal:  Denies nausea, heartburn or change in bowel habits Skin: Denies abnormal skin rashes Lymphatics: Denies new lymphadenopathy or easy bruising Neurological:Denies numbness, tingling or new weaknesses Behavioral/Psych: Mood is stable, no new changes  Extremities: No lower extremity edema Breast:  Radiation dermatitis All other systems were reviewed with the patient and are negative.  I have reviewed the past medical history, past surgical history, social history and family history with the patient and they are unchanged from previous note.  ALLERGIES:  has No Known Allergies.  MEDICATIONS:  Current Outpatient Prescriptions  Medication Sig Dispense Refill  . b complex vitamins tablet Take 1 tablet by mouth daily.    . calcium gluconate 500 MG tablet Take 1 tablet by mouth daily.    . diphenhydrAMINE (BENADRYL) 25 MG tablet Take 25 mg by mouth as needed.    Nyoka Cowden Tea 150 MG CAPS Take by mouth.    Marland Kitchen HYDROcodone-acetaminophen (NORCO/VICODIN) 5-325 MG tablet Take 1-2 tablets by mouth every 4 (four) hours as needed. (Patient not taking: Reported on 03/07/2017) 15 tablet 0  . ibuprofen (ADVIL,MOTRIN) 200 MG tablet Take 200 mg by mouth as needed.    Marland Kitchen levothyroxine (SYNTHROID, LEVOTHROID) 112 MCG tablet TAKE ONE (1) TABLET BY MOUTH EVERY DAY BEFORE BREAKFAST 90 tablet 4  . loratadine (CLARITIN) 10 MG tablet Take 10 mg by mouth as needed for allergies.    . Multiple Vitamins-Minerals (MULTIVITAMINS THER. W/MINERALS) TABS tablet Take 1 tablet by mouth daily.    Marland Kitchen  Omega-3 Fatty Acids (FISH OIL) 1200 MG CAPS Take 1 capsule by mouth as needed.    Marland Kitchen omeprazole (PRILOSEC) 20 MG capsule TAKE ONE (1) CAPSULE EACH DAY 30 capsule 5  . vitamin C (ASCORBIC ACID) 500 MG tablet Take 500 mg by mouth daily.     No  current facility-administered medications for this visit.     PHYSICAL EXAMINATION: ECOG PERFORMANCE STATUS: 1 - Symptomatic but completely ambulatory  Vitals:   04/26/17 1413  BP: (!) 142/86  Pulse: (!) 106  Resp: 18  Temp: 98.4 F (36.9 C)  SpO2: 97%   Filed Weights   04/26/17 1413  Weight: 163 lb 11.2 oz (74.3 kg)    GENERAL:alert, no distress and comfortable SKIN: skin color, texture, turgor are normal, no rashes or significant lesions EYES: normal, Conjunctiva are pink and non-injected, sclera clear OROPHARYNX:no exudate, no erythema and lips, buccal mucosa, and tongue normal  NECK: supple, thyroid normal size, non-tender, without nodularity LYMPH:  no palpable lymphadenopathy in the cervical, axillary or inguinal LUNGS: clear to auscultation and percussion with normal breathing effort HEART: regular rate & rhythm and no murmurs and no lower extremity edema ABDOMEN:abdomen soft, non-tender and normal bowel sounds MUSCULOSKELETAL:no cyanosis of digits and no clubbing  NEURO: alert & oriented x 3 with fluent speech, no focal motor/sensory deficits EXTREMITIES: No lower extremity edema   LABORATORY DATA:  I have reviewed the data as listed   Chemistry      Component Value Date/Time   NA 142 01/26/2017 1221   K 4.8 01/26/2017 1221   CL 104 06/25/2014 1116   CO2 31 (H) 01/26/2017 1221   BUN 13.6 01/26/2017 1221   CREATININE 0.9 01/26/2017 1221      Component Value Date/Time   CALCIUM 9.8 01/26/2017 1221   ALKPHOS 84 01/26/2017 1221   AST 18 01/26/2017 1221   ALT 29 01/26/2017 1221   BILITOT 0.58 01/26/2017 1221       Lab Results  Component Value Date   WBC 6.7 01/26/2017   HGB 13.7 01/26/2017   HCT 40.8 01/26/2017   MCV 93.3 01/26/2017   PLT 259 01/26/2017   NEUTROABS 4.4 01/26/2017    ASSESSMENT & PLAN:  Malignant neoplasm of upper-outer quadrant of right breast in female, estrogen receptor positive ([place]) 02/17/2017 Right lumpectomy: Invasive  ductal carcinoma, grade 2, 0.7 cm, DCIS intermediate grade, margins negative, lymphovascular invasion present, additional lateral margin residual IDC grade 2 with DCIS margins negative, 0/2 lymph nodes, T1b N0 stage IA ER 95%, PR 60%, HER-2 negative, Ki-67 12%  Oncotype DX score 25 Adjuvant radiation 03/30/2017-  04/26/2017  Treatment plan: Adjuvant antiestrogen therapy with anastrozole  Anastrozole counseling:We discussed the risks and benefits of anti-estrogen therapy with aromatase inhibitors. These include but not limited to insomnia, hot flashes, mood changes, vaginal dryness, bone density loss, and weight gain. We strongly believe that the benefits far outweigh the risks. Patient understands these risks and consented to starting treatment. Planned treatment duration is 5-7 years.   return to clinic in 3 months for survivorship care plan was that and after that I will see her 6 months from that point  I spent 25 minutes talking to the patient of which more than half was spent in counseling and coordination of care.  No orders of the defined types were placed in this encounter.  The patient has Black good understanding of the overall plan. she agrees with it. she will call with any problems that may develop before  the next visit here.   Rulon Eisenmenger, MD 04/26/17

## 2017-04-26 NOTE — Assessment & Plan Note (Signed)
02/17/2017 Right lumpectomy: Invasive ductal carcinoma, grade 2, 0.7 cm, DCIS intermediate grade, margins negative, lymphovascular invasion present, additional lateral margin residual IDC grade 2 with DCIS margins negative, 0/2 lymph nodes, T1b N0 stage IA ER 95%, PR 60%, HER-2 negative, Ki-67 12%  Oncotype DX score 25 Adjuvant radiation 03/30/2017-  04/26/2017  Treatment plan: Adjuvant antiestrogen therapy with anastrozole  Anastrozole counseling:We discussed the risks and benefits of anti-estrogen therapy with aromatase inhibitors. These include but not limited to insomnia, hot flashes, mood changes, vaginal dryness, bone density loss, and weight gain. We strongly believe that the benefits far outweigh the risks. Patient understands these risks and consented to starting treatment. Planned treatment duration is 5-7 years.

## 2017-04-27 ENCOUNTER — Encounter: Payer: Self-pay | Admitting: *Deleted

## 2017-04-27 ENCOUNTER — Ambulatory Visit: Payer: BLUE CROSS/BLUE SHIELD

## 2017-04-27 ENCOUNTER — Ambulatory Visit
Admission: RE | Admit: 2017-04-27 | Discharge: 2017-04-27 | Disposition: A | Discharge status: Home / Self Care | Payer: BLUE CROSS/BLUE SHIELD | Source: Ambulatory Visit | Attending: Radiation Oncology | Admitting: Radiation Oncology

## 2017-04-27 DIAGNOSIS — Z510 Encounter for antineoplastic radiation therapy: Secondary | ICD-10-CM | POA: Diagnosis not present

## 2017-04-27 DIAGNOSIS — C50411 Malignant neoplasm of upper-outer quadrant of right female breast: Secondary | ICD-10-CM | POA: Diagnosis not present

## 2017-04-28 ENCOUNTER — Ambulatory Visit
Admission: RE | Admit: 2017-04-28 | Discharge: 2017-04-28 | Disposition: A | Discharge status: Home / Self Care | Payer: PPO | Source: Ambulatory Visit | Attending: Radiation Oncology | Admitting: Radiation Oncology

## 2017-04-28 ENCOUNTER — Encounter: Payer: Self-pay | Admitting: Radiation Oncology

## 2017-04-28 DIAGNOSIS — Z510 Encounter for antineoplastic radiation therapy: Secondary | ICD-10-CM | POA: Diagnosis not present

## 2017-05-02 NOTE — Progress Notes (Signed)
  Radiation Oncology         [phone] ________________________________  Name: [name] Black MRN: [id number]  Date: 04/28/2017  DOB: 17-Aug-1951  End of Treatment Note  Diagnosis:   Stage pT1b, pN0Right BreastInvasive Ductal Carcinoma with DCIS, ER 95%, PR 60%, HER2-negative, grade 2     Indication for treatment:  Curative       Radiation treatment dates:   03/30/2017 to 04/28/2017  Site/dose:    1. The Right breast was treated to 42.72 Gy in 16 fractions at 2.67 Gy per fraction. 2. The Right breast was boosted to 10 Gy in 5 fractions at 2 Gy per fraction.   Beams/energy:    1. 3-D // 6X 2. electrons // 12E  Narrative: The patient tolerated radiation treatment relatively well. The patient experienced mild fatigue. She reports soreness under her right arm and a little swelling. She also reports her skin in the treatment area has started to itch. There are no signs of infection in the breast.    Plan: The patient has completed radiation treatment. The patient will return to radiation oncology clinic for routine followup in one month. I advised them to call or return sooner if they have any questions or concerns related to their recovery or treatment.  -----------------------------------  Blair Promise, PhD, MD  This document serves as a record of services personally performed by Gery Pray, MD. It was created on his behalf by Arlyce Harman, a trained medical scribe. The creation of this record is based on the scribe's personal observations and the provider's statements to them. This document has been checked and approved by the attending provider.

## 2017-05-19 ENCOUNTER — Other Ambulatory Visit: Payer: Self-pay | Admitting: Hematology and Oncology

## 2017-05-19 DIAGNOSIS — E2839 Other primary ovarian failure: Secondary | ICD-10-CM

## 2017-05-27 ENCOUNTER — Encounter: Payer: Self-pay | Admitting: Oncology

## 2017-05-30 ENCOUNTER — Ambulatory Visit
Admission: RE | Admit: 2017-05-30 | Discharge: 2017-05-30 | Disposition: A | Discharge status: Home / Self Care | Payer: PPO | Source: Ambulatory Visit | Attending: Radiation Oncology | Admitting: Radiation Oncology

## 2017-05-30 ENCOUNTER — Encounter: Payer: Self-pay | Admitting: Radiation Oncology

## 2017-05-30 DIAGNOSIS — Z170 Estrogen receptor positive status [ER+]: Secondary | ICD-10-CM | POA: Diagnosis not present

## 2017-05-30 DIAGNOSIS — Z79899 Other long term (current) drug therapy: Secondary | ICD-10-CM | POA: Diagnosis not present

## 2017-05-30 DIAGNOSIS — Z79811 Long term (current) use of aromatase inhibitors: Secondary | ICD-10-CM | POA: Diagnosis not present

## 2017-05-30 DIAGNOSIS — Z79890 Hormone replacement therapy (postmenopausal): Secondary | ICD-10-CM | POA: Diagnosis not present

## 2017-05-30 DIAGNOSIS — C50411 Malignant neoplasm of upper-outer quadrant of right female breast: Secondary | ICD-10-CM | POA: Diagnosis not present

## 2017-05-30 DIAGNOSIS — Z923 Personal history of irradiation: Secondary | ICD-10-CM | POA: Diagnosis not present

## 2017-05-30 NOTE — Progress Notes (Signed)
Radiation Oncology         [phone] ________________________________  Name: [name] Black MRN: [id number]  Date: 05/30/2017  DOB: [birth year]  Follow-Up Visit Note  CC: Celene Squibb, MD  Nicholas Lose, MD    ICD-10-CM   1. Malignant neoplasm of upper-outer quadrant of right breast in female, estrogen receptor positive ([place]) C50.411    Z17.0     Diagnosis:   Stage pT1b, pN0Right BreastInvasive Ductal Carcinoma with DCIS, ER 95%, PR 60%, HER2-negative, grade 2   Interval Since Last Radiation:  1 months, 03/30/2017-04/28/2017  Narrative:  The patient returns today for routine follow-up. Pt reports that she is taking prescription letrozole that is Rx by Dr. Lindi Adie. Pt reports that she has been on the medications since 2 weeks after completion of radiation. Pt notes that she is having mild fatigue while using this medication and no other complications.   On review of systems, pt reports mild pain to right axilla. She reports burning/pinching sensation to her right axilla that lasts approximately 10 minutes. Pt reports improving right upper arm pain. Pt denies swelling to her arms. Pt denies nipple discharge or bleeding. She reports weight gain and notes that she has not been exercising as she typically does.                              ALLERGIES:  has No Known Allergies.  Meds: Current Outpatient Prescriptions  Medication Sig Dispense Refill  . b complex vitamins tablet Take 1 tablet by mouth daily.    . calcium gluconate 500 MG tablet Take 1 tablet by mouth daily.    . diphenhydrAMINE (BENADRYL) 25 MG tablet Take 25 mg by mouth as needed.    Nyoka Cowden Tea 150 MG CAPS Take by mouth.    Marland Kitchen ibuprofen (ADVIL,MOTRIN) 200 MG tablet Take 200 mg by mouth as needed.    Marland Kitchen letrozole (FEMARA) 2.5 MG tablet Take 1 tablet (2.5 mg total) by mouth daily. 90 tablet 3  . levothyroxine (SYNTHROID, LEVOTHROID) 112 MCG tablet TAKE ONE (1) TABLET BY MOUTH EVERY DAY BEFORE BREAKFAST 90 tablet 4  .  loratadine (CLARITIN) 10 MG tablet Take 10 mg by mouth as needed for allergies.    . Multiple Vitamins-Minerals (MULTIVITAMINS THER. W/MINERALS) TABS tablet Take 1 tablet by mouth daily.    . Omega-3 Fatty Acids (FISH OIL OMEGA-3 PO) Take by mouth.    Marland Kitchen omeprazole (PRILOSEC) 20 MG capsule TAKE ONE (1) CAPSULE EACH DAY 30 capsule 5  . TURMERIC PO Take by mouth.    . vitamin C (ASCORBIC ACID) 500 MG tablet Take 500 mg by mouth daily.     No current facility-administered medications for this encounter.     Physical Findings: The patient is in no acute distress. Patient is alert and oriented.  weight is 165 lb (74.8 kg). Her oral temperature is 98.8 F (37.1 C). Her blood pressure is 152/78 (abnormal) and her pulse is 88. Her respiration is 20 and oxygen saturation is 98%. .  No significant changes. Lungs are clear to auscultation bilaterally. Heart has regular rate and rhythm. No palpable cervical, supraclavicular, or axillary adenopathy. Abdomen soft, non-tender, normal bowel sounds. Left breast with no palpable mass, nipple discharge, or bleeding. Right breat with mild hyperpigmentation changes. Mild edema in the nipple areolar complex area. No dominant mass, nipple discharge, or bleeding.    Lab Findings: Lab Results  Component Value Date  WBC 6.7 01/26/2017   HGB 13.7 01/26/2017   HCT 40.8 01/26/2017   MCV 93.3 01/26/2017   PLT 259 01/26/2017    Radiographic Findings: No results found.  Impression:  Stage pT1b, pN0Right BreastInvasive Ductal Carcinoma with DCIS, ER 95%, PR 60%, HER2-negative, grade 2  The patient is recovering from the effects of radiation.  No evidence of recurrence on clinical exam.   Plan:  PRN follow up in radiation oncology. Pt will continue close follow up in medical oncology and surgery.   ____________________________________ -----------------------------------  Blair Promise, PhD, MD   This document serves as a record of services personally  performed by Gery Pray, MD. It was created on her behalf by Steva Colder, a trained medical scribe. The creation of this record is based on the scribe's personal observations and the provider's statements to them. This document has been checked and approved by the attending provider.

## 2017-05-30 NOTE — Progress Notes (Signed)
[name] Black is here for a follow -Up appointment. Patient states that she has pain under her right arm,that sometimes burn. She also stated that under her right arm is tender. Patient states that  She is having some fatigue. Patient states that she uses the radaiplex cream sometime. States that she does have some itching ,but she uses hydrocortisone cream and gets relief. Patient states that her skin has a little hyperpigment.  Wt Readings from Last 3 Encounters:  05/30/17 165 lb (74.8 kg)  04/26/17 163 lb 11.2 oz (74.3 kg)  03/15/17 161 lb 14.4 oz (73.4 kg)   Vitals:   05/30/17 1622  BP: (!) 152/78  Pulse: 88  Resp: 20  Temp: 98.8 F (37.1 C)  TempSrc: Oral  SpO2: 98%  Weight: 165 lb (74.8 kg)

## 2017-06-14 ENCOUNTER — Ambulatory Visit
Admission: RE | Admit: 2017-06-14 | Discharge: 2017-06-14 | Disposition: A | Discharge status: Home / Self Care | Payer: PPO | Source: Ambulatory Visit | Attending: Hematology and Oncology | Admitting: Hematology and Oncology

## 2017-06-14 DIAGNOSIS — Z780 Asymptomatic menopausal state: Secondary | ICD-10-CM | POA: Diagnosis not present

## 2017-06-14 DIAGNOSIS — E2839 Other primary ovarian failure: Secondary | ICD-10-CM

## 2017-06-14 DIAGNOSIS — M85851 Other specified disorders of bone density and structure, right thigh: Secondary | ICD-10-CM | POA: Diagnosis not present

## 2017-06-15 DIAGNOSIS — M9901 Segmental and somatic dysfunction of cervical region: Secondary | ICD-10-CM | POA: Diagnosis not present

## 2017-06-15 DIAGNOSIS — M542 Cervicalgia: Secondary | ICD-10-CM | POA: Diagnosis not present

## 2017-06-15 DIAGNOSIS — M545 Low back pain: Secondary | ICD-10-CM | POA: Diagnosis not present

## 2017-06-15 DIAGNOSIS — M9900 Segmental and somatic dysfunction of head region: Secondary | ICD-10-CM | POA: Diagnosis not present

## 2017-06-15 DIAGNOSIS — M9903 Segmental and somatic dysfunction of lumbar region: Secondary | ICD-10-CM | POA: Diagnosis not present

## 2017-06-15 DIAGNOSIS — M546 Pain in thoracic spine: Secondary | ICD-10-CM | POA: Diagnosis not present

## 2017-06-15 DIAGNOSIS — M9902 Segmental and somatic dysfunction of thoracic region: Secondary | ICD-10-CM | POA: Diagnosis not present

## 2017-06-15 DIAGNOSIS — M2669 Other specified disorders of temporomandibular joint: Secondary | ICD-10-CM | POA: Diagnosis not present

## 2017-06-23 DIAGNOSIS — C50411 Malignant neoplasm of upper-outer quadrant of right female breast: Secondary | ICD-10-CM | POA: Diagnosis not present

## 2017-07-11 ENCOUNTER — Telehealth: Payer: Self-pay | Admitting: Hematology and Oncology

## 2017-07-11 NOTE — Telephone Encounter (Signed)
I left a message with the patient that the bone density showed a T score of -2 which is suggestive of osteopenia.  I suspect that her bone density could get worse because of anastrozole therapy and that I recommended that she take bisphosphonate therapy whether it is Fosamax or Boniva or Prolia injections. She will discussed this with her primary care physician and get started on 1 of these treatments. If she calls [place] back we can discuss the pros and cons of taking a bisphosphonate therapy as well.

## 2017-07-22 ENCOUNTER — Telehealth: Payer: Self-pay

## 2017-07-22 DIAGNOSIS — M2669 Other specified disorders of temporomandibular joint: Secondary | ICD-10-CM | POA: Diagnosis not present

## 2017-07-22 DIAGNOSIS — M542 Cervicalgia: Secondary | ICD-10-CM | POA: Diagnosis not present

## 2017-07-22 DIAGNOSIS — E039 Hypothyroidism, unspecified: Secondary | ICD-10-CM | POA: Diagnosis not present

## 2017-07-22 DIAGNOSIS — M9900 Segmental and somatic dysfunction of head region: Secondary | ICD-10-CM | POA: Diagnosis not present

## 2017-07-22 DIAGNOSIS — R7301 Impaired fasting glucose: Secondary | ICD-10-CM | POA: Diagnosis not present

## 2017-07-22 DIAGNOSIS — M545 Low back pain: Secondary | ICD-10-CM | POA: Diagnosis not present

## 2017-07-22 DIAGNOSIS — M9902 Segmental and somatic dysfunction of thoracic region: Secondary | ICD-10-CM | POA: Diagnosis not present

## 2017-07-22 DIAGNOSIS — M9903 Segmental and somatic dysfunction of lumbar region: Secondary | ICD-10-CM | POA: Diagnosis not present

## 2017-07-22 DIAGNOSIS — M9901 Segmental and somatic dysfunction of cervical region: Secondary | ICD-10-CM | POA: Diagnosis not present

## 2017-07-22 DIAGNOSIS — M546 Pain in thoracic spine: Secondary | ICD-10-CM | POA: Diagnosis not present

## 2017-07-22 NOTE — Telephone Encounter (Signed)
Spoke with pt to remind of [facility] visit on 11/27 @ 2 pm.  Pt stated she would come to appt.

## 2017-07-25 DIAGNOSIS — E039 Hypothyroidism, unspecified: Secondary | ICD-10-CM | POA: Diagnosis not present

## 2017-07-25 DIAGNOSIS — M8580 Other specified disorders of bone density and structure, unspecified site: Secondary | ICD-10-CM | POA: Diagnosis not present

## 2017-07-25 DIAGNOSIS — Z0000 Encounter for general adult medical examination without abnormal findings: Secondary | ICD-10-CM | POA: Diagnosis not present

## 2017-07-25 DIAGNOSIS — C50919 Malignant neoplasm of unspecified site of unspecified female breast: Secondary | ICD-10-CM | POA: Diagnosis not present

## 2017-07-25 DIAGNOSIS — R7303 Prediabetes: Secondary | ICD-10-CM | POA: Diagnosis not present

## 2017-07-25 DIAGNOSIS — Z6827 Body mass index (BMI) 27.0-27.9, adult: Secondary | ICD-10-CM | POA: Diagnosis not present

## 2017-07-25 DIAGNOSIS — K219 Gastro-esophageal reflux disease without esophagitis: Secondary | ICD-10-CM | POA: Diagnosis not present

## 2017-08-02 ENCOUNTER — Ambulatory Visit (HOSPITAL_BASED_OUTPATIENT_CLINIC_OR_DEPARTMENT_OTHER): Payer: PPO | Admitting: Adult Health

## 2017-08-02 VITALS — BP 159/94 | HR 86 | Temp 97.7°F | Resp 17 | Ht 66.0 in | Wt 165.6 lb

## 2017-08-02 DIAGNOSIS — Z170 Estrogen receptor positive status [ER+]: Secondary | ICD-10-CM | POA: Diagnosis not present

## 2017-08-02 DIAGNOSIS — M8580 Other specified disorders of bone density and structure, unspecified site: Secondary | ICD-10-CM

## 2017-08-02 DIAGNOSIS — C50411 Malignant neoplasm of upper-outer quadrant of right female breast: Secondary | ICD-10-CM

## 2017-08-02 DIAGNOSIS — Z79811 Long term (current) use of aromatase inhibitors: Secondary | ICD-10-CM

## 2017-08-02 NOTE — Progress Notes (Signed)
CLINIC:  Survivorship   REASON FOR VISIT:  Routine follow-up post-treatment for a recent history of breast cancer.  BRIEF ONCOLOGIC HISTORY:    Malignant neoplasm of upper-outer quadrant of right breast in female, estrogen receptor positive ([place])   01/19/2017 Initial Diagnosis    Screening detected right breast mass 5 mm size axilla negative biopsy: Grade 2 invasive ductal carcinoma with DCIS ER 95%, PR 60%, HER-2 negative ratio 1.38, Ki-67 12%, T1aN0 stage IA clinical stage      02/17/2017 Surgery    Right lumpectomy: Invasive ductal carcinoma, grade 2, 0.7 cm, DCIS intermediate grade, margins negative, lymphovascular invasion present, additional lateral margin residual IDC grade 2 with DCIS margins negative, 0/2 lymph nodes, T1b N0 stage IA ER 95%, PR 60%, HER-2 negative, Ki-67 12%      03/15/2017 Oncotype testing    Oncotype DX score 25, intermediate risk: 16% ROR with hormonal therapy alone      03/30/2017 - 04/26/2017 Radiation Therapy     Adjuvant radiation therapy      05/2017 -  Anti-estrogen oral therapy    Letrozole daily       INTERVAL HISTORY:  [name] Black presents to the [facility] today for our initial meeting to review her survivorship care plan detailing her treatment course for breast cancer, as well as monitoring long-term side effects of that treatment, education regarding health maintenance, screening, and overall wellness and health promotion.     Overall, [name] Black reports feeling quite well.  She is concerned about some right axillary tightness that has been present for a few months.  She was evaluated by Dr. Donne Hazel last month and has continued doing exercises, however she is still having issues in this area.  She also wants to talk about bisphosphanate therapy due to her recent bone density.    REVIEW OF SYSTEMS:  Review of Systems  Constitutional: Negative for appetite change, chills, fatigue, fever and unexpected weight change.  HENT:    Negative for hearing loss and lump/mass.   Eyes: Negative for eye problems and icterus.  Respiratory: Negative for chest tightness, cough and shortness of breath.   Cardiovascular: Negative for chest pain, leg swelling and palpitations.  Gastrointestinal: Negative for abdominal distention, abdominal pain, constipation, diarrhea, nausea and vomiting.  Endocrine: Negative for hot flashes.  Genitourinary: Negative for difficulty urinating.   Musculoskeletal: Negative for arthralgias.  Skin: Negative for itching and rash.  Neurological: Negative for dizziness, extremity weakness, headaches and numbness.  Hematological: Negative for adenopathy. Does not bruise/bleed easily.  Psychiatric/Behavioral: Negative for depression. The patient is not nervous/anxious.   Breast: Denies any new nodularity, masses, tenderness, nipple changes, or nipple discharge.      ONCOLOGY TREATMENT TEAM:  1. Surgeon:  Dr. Donne Hazel at [facility] 2. Medical Oncologist: Dr. Lindi Adie  3. Radiation Oncologist: Dr. Sondra Come    PAST MEDICAL/SURGICAL HISTORY:  Past Medical History:  Diagnosis Date  . Cancer ([place]) 01/2017   right breast cancer  . GERD (gastroesophageal reflux disease)   . H/O seasonal allergies   . History of radiation therapy 03/30/17-04/28/17   right breast was treated to 42.72 Gy, right breast boost 10 Gy  . Hypothyroidism    Past Surgical History:  Procedure Laterality Date  . COLONOSCOPY    . COLONOSCOPY N/A 01/09/2014   Procedure: COLONOSCOPY;  Surgeon: Rogene Houston, MD;  [facility];  Service: Endoscopy;  Laterality: N/A;  1030  . Cyst removed from left knee    .  Fibroid removed and cyst removed from ovary    . RADIOACTIVE SEED GUIDED PARTIAL MASTECTOMY WITH AXILLARY SENTINEL LYMPH NODE BIOPSY Right 02/17/2017   Procedure: RADIOACTIVE SEED GUIDED PARTIAL MASTECTOMY WITH AXILLARY SENTINEL LYMPH NODE BIOPSY;  Surgeon: Rolm Bookbinder, MD;  Location: [place];  Service: General;  Laterality: Right;  . Right knee arthroscopy       ALLERGIES:  No Known Allergies   CURRENT MEDICATIONS:  Outpatient Encounter Medications as of 08/02/2017  Medication Sig  . b complex vitamins tablet Take 1 tablet by mouth daily.  . calcium carbonate (OS-CAL) 600 MG TABS tablet Take 600 mg by mouth 2 (two) times daily with a meal.  . diphenhydrAMINE (BENADRYL) 25 MG tablet Take 25 mg by mouth as needed.  Nyoka Cowden Tea 150 MG CAPS Take by mouth.  Marland Kitchen ibuprofen (ADVIL,MOTRIN) 200 MG tablet Take 200 mg by mouth as needed.  Marland Kitchen letrozole (FEMARA) 2.5 MG tablet Take 1 tablet (2.5 mg total) by mouth daily.  Marland Kitchen levothyroxine (SYNTHROID, LEVOTHROID) 112 MCG tablet TAKE ONE (1) TABLET BY MOUTH EVERY DAY BEFORE BREAKFAST  . loratadine (CLARITIN) 10 MG tablet Take 10 mg by mouth as needed for allergies.   . Multiple Vitamins-Minerals (MULTIVITAMINS THER. W/MINERALS) TABS tablet Take 1 tablet by mouth daily.  . Omega-3 Fatty Acids (FISH OIL OMEGA-3 PO) Take by mouth.  Marland Kitchen omeprazole (PRILOSEC) 20 MG capsule TAKE ONE (1) CAPSULE EACH DAY  . TURMERIC PO Take by mouth.  . vitamin C (ASCORBIC ACID) 500 MG tablet Take 500 mg by mouth daily.  . [DISCONTINUED] calcium gluconate 500 MG tablet Take 1 tablet by mouth daily.   No facility-administered encounter medications on file as of 08/02/2017.      ONCOLOGIC FAMILY HISTORY:  Family History  Problem Relation Age of Onset  . Cancer Mother 55       Breast  . Breast cancer Maternal Aunt   . Breast cancer Maternal Aunt   . Colon cancer Neg Hx        PHYSICAL EXAMINATION:  Vital Signs:   Vitals:   08/02/17 1408  BP: (!) 159/94  Pulse: 86  Resp: 17  Temp: 97.7 F (36.5 C)  SpO2: 97%   Filed Weights   08/02/17 1408  Weight: 165 lb 9.6 oz (75.1 kg)   General: Well-nourished, well-appearing female in no acute distress.  She is unaccompanied today.   HEENT: Head is normocephalic.  Pupils equal and reactive to  light. Conjunctivae clear without exudate.  Sclerae anicteric. Oral mucosa is pink, moist.  Oropharynx is pink without lesions or erythema.  Lymph: No cervical, supraclavicular, or infraclavicular lymphadenopathy noted on palpation.  Cardiovascular: Regular rate and rhythm.Marland Kitchen Respiratory: Clear to auscultation bilaterally. Chest expansion symmetric; breathing non-labored.  GI: Abdomen soft and round; non-tender, non-distended. Bowel sounds normoactive.  GU: Deferred.  Neuro: No focal deficits. Steady gait.  Psych: Mood and affect normal and appropriate for situation.  Extremities: No edema. MSK: No focal spinal tenderness to palpation.  Full range of motion in bilateral upper extremities Skin: Warm and dry.  LABORATORY DATA:  None for this visit.  DIAGNOSTIC IMAGING:  None for this visit.      ASSESSMENT AND PLAN:  Ms.. Black is a pleasant 66 y.o. female with Stage IA right breast invasive ductal carcinoma, ER+/PR+/HER2-, diagnosed in 01/2017, treated with lumpectomy, adjuvant radiation therapy, and anti-estrogen therapy with Letrozole beginning in 05/2017.  She presents to the Survivorship Clinic for our initial meeting and  routine follow-up post-completion of treatment for breast cancer.    1. Stage IA right breast cancer:  [name] Black is continuing to recover from definitive treatment for breast cancer. She will follow-up with her medical oncologist, Dr. Lindi Adie in 01/2018 with history and physical exam per surveillance protocol.  She will continue her anti-estrogen therapy with Letrozole. Thus far, she is tolerating the Letrozole well, with minimal side effects. She was instructed to make Dr. Lindi Adie or myself aware if she begins to experience any worsening side effects of the medication and I could see her back in clinic to help manage those side effects, as needed.  Today, a comprehensive survivorship care plan and treatment summary was reviewed with the patient today detailing her breast  cancer diagnosis, treatment course, potential late/long-term effects of treatment, appropriate follow-up care with recommendations for the future, and patient education resources.  A copy of this summary, along with a letter will be sent to the patient's primary care provider via mail/fax/In Basket message after today's visit.    2. Bone health:  Given [name] Black's age/history of breast cancer and her current treatment regimen including anti-estrogen therapy with Letrozole, she is at risk for bone demineralization.  Her last DEXA scan was 06/14/2017, which showed osteopenia with a t score of -2.0 in the right femur.  She was encouraged to take bisphosphanate therapy, but is very anxious about this and we discussed risks and benefits of this therapy.  After a long discussion, she decided to work on calcium, vitamin d, and weight bearing exercises.  She will be due for repeat bone density in 06/2019 and she understands that if her bone density continues to decrease that we will likely need to start bisphosphanate therapy at that time.  In the meantime, she was encouraged to increase her consumption of foods rich in calcium, as well as increase her weight-bearing activities.  She was given education on specific activities to promote bone health.  3. Cancer screening:  Due to [name] Black's history and her age, she should receive screening for skin cancers, colon cancer, and gynecologic cancers.  The information and recommendations are listed on the patient's comprehensive care plan/treatment summary and were reviewed in detail with the patient.    4. Health maintenance and wellness promotion: [name] Black was encouraged to consume 5-7 servings of fruits and vegetables per day. We reviewed the "Nutrition Rainbow" handout, as well as the handout "Take Control of Your Health and Reduce Your Cancer Risk" from the [place].  She was also encouraged to engage in moderate to vigorous exercise for 30  minutes per day most days of the week. We discussed the [employer] fitness program, which is designed for cancer survivors to help them become more physically fit after cancer treatments.  She was instructed to limit her alcohol consumption and continue to abstain from tobacco use.     5. Support services/counseling: It is not uncommon for this period of the patient's cancer care trajectory to be one of many emotions and stressors.  We discussed an opportunity for her to participate in the next session of [facility] ("Finding Your New Normal") support group series designed for patients after they have completed treatment.   [name] Black was encouraged to take advantage of our many other support services programs, support groups, and/or counseling in coping with her new life as a cancer survivor after completing anti-cancer treatment.  She was offered support today through active listening and expressive supportive counseling.  She  was given information regarding our available services and encouraged to contact me with any questions or for help enrolling in any of our support group/programs.    Dispo:   -Return to cancer center for follow up with Dr. Lindi Adie in 01/2018 -Mammogram due in 01/2018 -Bone density 06/2019 -Follow up with Dr. Donne Hazel 07/2018 -She is welcome to return back to the Survivorship Clinic at any time; no additional follow-up needed at this time.  -Consider referral back to survivorship as a long-term survivor for continued surveillance  A total of (30) minutes of face-to-face time was spent with this patient with greater than 50% of that time in counseling and care-coordination.   Gardenia Phlegm, NP Survivorship Program [facility] [phone]   Note: PRIMARY CARE PROVIDER Celene Squibb, [place] [phone]

## 2017-08-04 ENCOUNTER — Encounter: Payer: Self-pay | Admitting: Adult Health

## 2017-08-23 DIAGNOSIS — M546 Pain in thoracic spine: Secondary | ICD-10-CM | POA: Diagnosis not present

## 2017-08-23 DIAGNOSIS — M545 Low back pain: Secondary | ICD-10-CM | POA: Diagnosis not present

## 2017-08-23 DIAGNOSIS — M9900 Segmental and somatic dysfunction of head region: Secondary | ICD-10-CM | POA: Diagnosis not present

## 2017-08-23 DIAGNOSIS — M9902 Segmental and somatic dysfunction of thoracic region: Secondary | ICD-10-CM | POA: Diagnosis not present

## 2017-08-23 DIAGNOSIS — M9901 Segmental and somatic dysfunction of cervical region: Secondary | ICD-10-CM | POA: Diagnosis not present

## 2017-08-23 DIAGNOSIS — M542 Cervicalgia: Secondary | ICD-10-CM | POA: Diagnosis not present

## 2017-08-23 DIAGNOSIS — M9903 Segmental and somatic dysfunction of lumbar region: Secondary | ICD-10-CM | POA: Diagnosis not present

## 2017-08-23 DIAGNOSIS — M2669 Other specified disorders of temporomandibular joint: Secondary | ICD-10-CM | POA: Diagnosis not present

## 2017-09-27 DIAGNOSIS — Z01419 Encounter for gynecological examination (general) (routine) without abnormal findings: Secondary | ICD-10-CM | POA: Diagnosis not present

## 2017-09-30 DIAGNOSIS — M546 Pain in thoracic spine: Secondary | ICD-10-CM | POA: Diagnosis not present

## 2017-09-30 DIAGNOSIS — M9903 Segmental and somatic dysfunction of lumbar region: Secondary | ICD-10-CM | POA: Diagnosis not present

## 2017-09-30 DIAGNOSIS — M9902 Segmental and somatic dysfunction of thoracic region: Secondary | ICD-10-CM | POA: Diagnosis not present

## 2017-09-30 DIAGNOSIS — M545 Low back pain: Secondary | ICD-10-CM | POA: Diagnosis not present

## 2017-09-30 DIAGNOSIS — M9901 Segmental and somatic dysfunction of cervical region: Secondary | ICD-10-CM | POA: Diagnosis not present

## 2017-09-30 DIAGNOSIS — M2669 Other specified disorders of temporomandibular joint: Secondary | ICD-10-CM | POA: Diagnosis not present

## 2017-09-30 DIAGNOSIS — M542 Cervicalgia: Secondary | ICD-10-CM | POA: Diagnosis not present

## 2017-09-30 DIAGNOSIS — M9900 Segmental and somatic dysfunction of head region: Secondary | ICD-10-CM | POA: Diagnosis not present

## 2017-10-07 DIAGNOSIS — M542 Cervicalgia: Secondary | ICD-10-CM | POA: Diagnosis not present

## 2017-10-07 DIAGNOSIS — M9901 Segmental and somatic dysfunction of cervical region: Secondary | ICD-10-CM | POA: Diagnosis not present

## 2017-10-07 DIAGNOSIS — M546 Pain in thoracic spine: Secondary | ICD-10-CM | POA: Diagnosis not present

## 2017-10-07 DIAGNOSIS — M9903 Segmental and somatic dysfunction of lumbar region: Secondary | ICD-10-CM | POA: Diagnosis not present

## 2017-10-07 DIAGNOSIS — M9902 Segmental and somatic dysfunction of thoracic region: Secondary | ICD-10-CM | POA: Diagnosis not present

## 2017-10-07 DIAGNOSIS — M545 Low back pain: Secondary | ICD-10-CM | POA: Diagnosis not present

## 2017-10-14 DIAGNOSIS — M542 Cervicalgia: Secondary | ICD-10-CM | POA: Diagnosis not present

## 2017-10-14 DIAGNOSIS — M545 Low back pain: Secondary | ICD-10-CM | POA: Diagnosis not present

## 2017-10-14 DIAGNOSIS — M546 Pain in thoracic spine: Secondary | ICD-10-CM | POA: Diagnosis not present

## 2017-10-14 DIAGNOSIS — M9902 Segmental and somatic dysfunction of thoracic region: Secondary | ICD-10-CM | POA: Diagnosis not present

## 2017-10-14 DIAGNOSIS — M9901 Segmental and somatic dysfunction of cervical region: Secondary | ICD-10-CM | POA: Diagnosis not present

## 2017-10-14 DIAGNOSIS — M9903 Segmental and somatic dysfunction of lumbar region: Secondary | ICD-10-CM | POA: Diagnosis not present

## 2017-10-25 DIAGNOSIS — J019 Acute sinusitis, unspecified: Secondary | ICD-10-CM | POA: Diagnosis not present

## 2017-11-01 DIAGNOSIS — R202 Paresthesia of skin: Secondary | ICD-10-CM | POA: Diagnosis not present

## 2017-11-01 DIAGNOSIS — D229 Melanocytic nevi, unspecified: Secondary | ICD-10-CM | POA: Diagnosis not present

## 2017-11-18 ENCOUNTER — Ambulatory Visit
Admission: RE | Admit: 2017-11-18 | Discharge: 2017-11-18 | Disposition: A | Discharge status: Home / Self Care | Payer: PPO | Source: Ambulatory Visit | Attending: Hematology and Oncology | Admitting: Hematology and Oncology

## 2017-11-18 ENCOUNTER — Inpatient Hospital Stay: Payer: PPO | Attending: Hematology and Oncology | Admitting: Hematology and Oncology

## 2017-11-18 ENCOUNTER — Telehealth: Payer: Self-pay

## 2017-11-18 ENCOUNTER — Other Ambulatory Visit: Payer: Self-pay

## 2017-11-18 VITALS — BP 145/82 | HR 89 | Temp 99.0°F | Resp 18 | Ht 66.0 in | Wt 162.8 lb

## 2017-11-18 DIAGNOSIS — N6489 Other specified disorders of breast: Secondary | ICD-10-CM | POA: Diagnosis not present

## 2017-11-18 DIAGNOSIS — Z170 Estrogen receptor positive status [ER+]: Principal | ICD-10-CM

## 2017-11-18 DIAGNOSIS — C50411 Malignant neoplasm of upper-outer quadrant of right female breast: Secondary | ICD-10-CM

## 2017-11-18 DIAGNOSIS — Z923 Personal history of irradiation: Secondary | ICD-10-CM | POA: Insufficient documentation

## 2017-11-18 DIAGNOSIS — Z79899 Other long term (current) drug therapy: Secondary | ICD-10-CM | POA: Diagnosis not present

## 2017-11-18 DIAGNOSIS — Z79811 Long term (current) use of aromatase inhibitors: Secondary | ICD-10-CM | POA: Diagnosis not present

## 2017-11-18 DIAGNOSIS — R928 Other abnormal and inconclusive findings on diagnostic imaging of breast: Secondary | ICD-10-CM | POA: Diagnosis not present

## 2017-11-18 DIAGNOSIS — N6310 Unspecified lump in the right breast, unspecified quadrant: Secondary | ICD-10-CM | POA: Insufficient documentation

## 2017-11-18 NOTE — Assessment & Plan Note (Signed)
02/17/2017 Right lumpectomy: Invasive ductal carcinoma, grade 2, 0.7 cm, DCIS intermediate grade, margins negative, lymphovascular invasion present, additional lateral margin residual IDC grade 2 with DCIS margins negative, 0/2 lymph nodes, T1b N0 stage IA ER 95%, PR 60%, HER-2 negative, Ki-67 12%  Oncotype DX score 25 Adjuvant radiation 03/30/2017-  04/26/2017  Treatment plan: Adjuvant antiestrogen therapy with letrozole 2.5 mg daily  Right breast lumps: Patient palpated lumps in the right breast and came in for an urgent evaluation.  Her right breast is lumpy and nodular but I do not feel that the lumps are related to breast cancer. We will send her for a mammogram and ultrasound.

## 2017-11-18 NOTE — Progress Notes (Signed)
Patient Care Team: Celene Squibb, MD as PCP - General (Internal Medicine) Brien Few, MD as Consulting Physician (Obstetrics and Gynecology) Rolm Bookbinder, MD as Consulting Physician (General Surgery) Nicholas Lose, MD as Consulting Physician (Hematology and Oncology) Gery Pray, MD as Consulting Physician (Radiation Oncology) Delice Bison Charlestine Massed, NP as Nurse Practitioner (Hematology and Oncology)  DIAGNOSIS:  Encounter Diagnosis  Name Primary?  . Malignant neoplasm of upper-outer quadrant of right breast in female, estrogen receptor positive ([place]) Yes    SUMMARY OF ONCOLOGIC HISTORY:   Malignant neoplasm of upper-outer quadrant of right breast in female, estrogen receptor positive ([place])   01/19/2017 Initial Diagnosis    Screening detected right breast mass 5 mm size axilla negative biopsy: Grade 2 invasive ductal carcinoma with DCIS ER 95%, PR 60%, HER-2 negative ratio 1.38, Ki-67 12%, T1aN0 stage IA clinical stage      02/17/2017 Surgery    Right lumpectomy: Invasive ductal carcinoma, grade 2, 0.7 cm, DCIS intermediate grade, margins negative, lymphovascular invasion present, additional lateral margin residual IDC grade 2 with DCIS margins negative, 0/2 lymph nodes, T1b N0 stage IA ER 95%, PR 60%, HER-2 negative, Ki-67 12%      03/15/2017 Oncotype testing    Oncotype DX score 25, intermediate risk: 16% ROR with hormonal therapy alone      03/30/2017 - 04/26/2017 Radiation Therapy     Adjuvant radiation therapy      05/2017 -  Anti-estrogen oral therapy    Letrozole daily       CHIEF COMPLIANT: Follow-up on letrozole therapy, complaining of lumps in the right breast  INTERVAL HISTORY: [name] Black is a 67 year old with above-mentioned history of right breast cancer underwent lumpectomy followed by radiation and is currently on letrozole therapy.  She is tolerating letrozole extremely well.  She came in today for an urgent visit because she was noticed  palpable lumps in the right breast and got very anxious and panicked and she came to [place] for evaluation.  She also has a few tender spots in the right axilla.  REVIEW OF SYSTEMS:   Constitutional: Denies fevers, chills or abnormal weight loss Eyes: Denies blurriness of vision Ears, nose, mouth, throat, and face: Denies mucositis or sore throat Respiratory: Denies cough, dyspnea or wheezes Cardiovascular: Denies palpitation, chest discomfort Gastrointestinal:  Denies nausea, heartburn or change in bowel habits Skin: Denies abnormal skin rashes Lymphatics: Denies new lymphadenopathy or easy bruising Neurological:Denies numbness, tingling or new weaknesses Behavioral/Psych: Mood is stable, no new changes  Extremities: No lower extremity edema Breast: Right breast palpable lumps All other systems were reviewed with the patient and are negative.  I have reviewed the past medical history, past surgical history, social history and family history with the patient and they are unchanged from previous note.  ALLERGIES:  has No Known Allergies.  MEDICATIONS:  Current Outpatient Medications  Medication Sig Dispense Refill  . b complex vitamins tablet Take 1 tablet by mouth daily.    . calcium carbonate (OS-CAL) 600 MG TABS tablet Take 600 mg by mouth 2 (two) times daily with a meal.    . diphenhydrAMINE (BENADRYL) 25 MG tablet Take 25 mg by mouth as needed.    Nyoka Cowden Tea 150 MG CAPS Take by mouth.    Marland Kitchen ibuprofen (ADVIL,MOTRIN) 200 MG tablet Take 200 mg by mouth as needed.    Marland Kitchen letrozole (FEMARA) 2.5 MG tablet Take 1 tablet (2.5 mg total) by mouth daily. 90 tablet 3  . levothyroxine (SYNTHROID,  LEVOTHROID) 112 MCG tablet TAKE ONE (1) TABLET BY MOUTH EVERY DAY BEFORE BREAKFAST 90 tablet 4  . loratadine (CLARITIN) 10 MG tablet Take 10 mg by mouth as needed for allergies.     . Multiple Vitamins-Minerals (MULTIVITAMINS THER. W/MINERALS) TABS tablet Take 1 tablet by mouth daily.    . Omega-3 Fatty  Acids (FISH OIL OMEGA-3 PO) Take by mouth.    Marland Kitchen omeprazole (PRILOSEC) 20 MG capsule TAKE ONE (1) CAPSULE EACH DAY 30 capsule 5  . TURMERIC PO Take by mouth.    . vitamin C (ASCORBIC ACID) 500 MG tablet Take 500 mg by mouth daily.     No current facility-administered medications for this visit.     PHYSICAL EXAMINATION: ECOG PERFORMANCE STATUS: 1 - Symptomatic but completely ambulatory  Vitals:   11/18/17 1153  BP: (!) 145/82  Pulse: 89  Resp: 18  Temp: 99 F (37.2 C)  SpO2: 98%   Filed Weights   11/18/17 1153  Weight: 162 lb 12.8 oz (73.8 kg)    GENERAL:alert, no distress and comfortable SKIN: skin color, texture, turgor are normal, no rashes or significant lesions EYES: normal, Conjunctiva are pink and non-injected, sclera clear OROPHARYNX:no exudate, no erythema and lips, buccal mucosa, and tongue normal  NECK: supple, thyroid normal size, non-tender, without nodularity LYMPH:  no palpable lymphadenopathy in the cervical, axillary or inguinal LUNGS: clear to auscultation and percussion with normal breathing effort HEART: regular rate & rhythm and no murmurs and no lower extremity edema ABDOMEN:abdomen soft, non-tender and normal bowel sounds MUSCULOSKELETAL:no cyanosis of digits and no clubbing  NEURO: alert & oriented x 3 with fluent speech, no focal motor/sensory deficits EXTREMITIES: No lower extremity edema BREAST: Right breast palpable lumps which are most likely breast nodularity rather than tumors.  (exam performed in the presence of a chaperone)  LABORATORY DATA:  I have reviewed the data as listed CMP Latest Ref Rng & Units 01/26/2017 06/25/2014  Glucose 70 - 140 mg/dl 77 87  BUN 7.0 - 26.0 mg/dL 13.6 13  Creatinine 0.6 - 1.1 mg/dL 0.9 0.83  Sodium 136 - 145 mEq/L 142 140  Potassium 3.5 - 5.1 mEq/L 4.8 4.9  Chloride 96 - 112 mEq/L - 104  CO2 22 - 29 mEq/L 31(H) 20  Calcium 8.4 - 10.4 mg/dL 9.8 9.7  Total Protein 6.4 - 8.3 g/dL 7.6 7.2  Total Bilirubin 0.20  - 1.20 mg/dL 0.58 0.5  Alkaline Phos 40 - 150 U/L 84 74  AST 5 - 34 U/L 18 17  ALT 0 - 55 U/L 29 23    Lab Results  Component Value Date   WBC 6.7 01/26/2017   HGB 13.7 01/26/2017   HCT 40.8 01/26/2017   MCV 93.3 01/26/2017   PLT 259 01/26/2017   NEUTROABS 4.4 01/26/2017    ASSESSMENT & PLAN:  Malignant neoplasm of upper-outer quadrant of right breast in female, estrogen receptor positive ([place]) 02/17/2017 Right lumpectomy: Invasive ductal carcinoma, grade 2, 0.7 cm, DCIS intermediate grade, margins negative, lymphovascular invasion present, additional lateral margin residual IDC grade 2 with DCIS margins negative, 0/2 lymph nodes, T1b N0 stage IA ER 95%, PR 60%, HER-2 negative, Ki-67 12%  Oncotype DX score 25 Adjuvant radiation 03/30/2017-  04/26/2017  Treatment plan: Adjuvant antiestrogen therapy with letrozole 2.5 mg daily  Right breast lumps: Patient palpated lumps in the right breast and came in for an urgent evaluation.  Her right breast is lumpy and nodular but I do not feel that the lumps  are related to breast cancer. We will send her for a mammogram and ultrasound.  I spent 25 minutes talking to the patient of which more than half was spent in counseling and coordination of care.  Orders Placed This Encounter  Procedures  . MM DIAG BREAST TOMO UNI RIGHT    HTA/pf 01/11/17 bcg/no needs/hx of breast ca/no implants/af with [facility] at [place]    Standing Status:   Future    Standing Expiration Date:   01/19/2019    Order Specific Question:   Reason for Exam (SYMPTOM  OR DIAGNOSIS REQUIRED)    Answer:   New lump and painful breast x1 week    Order Specific Question:   Preferred imaging location?    Answer:   [facility]  . US BREAST LTD UNI RIGHT INC AXILLA    HTA/pf 01/11/17 bcg/no needs/hx of breast ca/no implants/af with May at [place]    Standing Status:   Future    Standing Expiration Date:   01/19/2019    Order Specific  Question:   Reason for Exam (SYMPTOM  OR DIAGNOSIS REQUIRED)    Answer:   new lump and R breast pain.    Order Specific Question:   Preferred imaging location?    Answer:   [facility]   The patient has a good understanding of the overall plan. she agrees with it. she will call with any problems that may develop before the next visit here.   Harriette Ohara, MD 11/18/17

## 2017-11-18 NOTE — Telephone Encounter (Signed)
Called and returned pt after hr call regarding concerns of new hard lump on her breast. Pt s/p R lumpectomy last 02/2017, followed by radiation. Currently on letrozole therapy.  Pt noticed x1 week ago that she had a hard lump near the outer part of her nipple. She is not able to tell if its movable, but tender to touch. Pt also noticed a small tender spot on the side of her R breast. Per pt report, this was all new since after surgery/radiation. She is scheduled for mammogram in May 2019.   Told pt that we can schedule her for diagnostic mm now and have her come see Dr.Gudena afterwards. Pt expressed her concern and anxiety about lump. She would like to see and speak with Dr.Gudena first, so he can do a breast exam first. Discussed with Dr. Lindi Adie can see the pt today.   Set pt up for 1145am appt today. Pt confirmed and will be coming in at 11:30am.  No futher needs at this time.

## 2017-11-18 NOTE — Progress Notes (Signed)
Pt seen in office today for breast exam. Per Dr.Gudena, pt to be ordered MM of R breast today, if possible. Called Breast center and scheduled pt for this afternoon at 330pm. Confirmed time/date with pt. Orders placed in epic.

## 2017-11-21 ENCOUNTER — Telehealth: Payer: Self-pay | Admitting: Hematology and Oncology

## 2017-11-21 NOTE — Telephone Encounter (Signed)
Per 3/15 no los 

## 2017-12-07 DIAGNOSIS — M545 Low back pain: Secondary | ICD-10-CM | POA: Diagnosis not present

## 2017-12-07 DIAGNOSIS — M9902 Segmental and somatic dysfunction of thoracic region: Secondary | ICD-10-CM | POA: Diagnosis not present

## 2017-12-07 DIAGNOSIS — M546 Pain in thoracic spine: Secondary | ICD-10-CM | POA: Diagnosis not present

## 2017-12-07 DIAGNOSIS — M9903 Segmental and somatic dysfunction of lumbar region: Secondary | ICD-10-CM | POA: Diagnosis not present

## 2017-12-07 DIAGNOSIS — M542 Cervicalgia: Secondary | ICD-10-CM | POA: Diagnosis not present

## 2017-12-07 DIAGNOSIS — M2669 Other specified disorders of temporomandibular joint: Secondary | ICD-10-CM | POA: Diagnosis not present

## 2017-12-07 DIAGNOSIS — M9901 Segmental and somatic dysfunction of cervical region: Secondary | ICD-10-CM | POA: Diagnosis not present

## 2017-12-26 DIAGNOSIS — M9901 Segmental and somatic dysfunction of cervical region: Secondary | ICD-10-CM | POA: Diagnosis not present

## 2017-12-26 DIAGNOSIS — M2669 Other specified disorders of temporomandibular joint: Secondary | ICD-10-CM | POA: Diagnosis not present

## 2017-12-26 DIAGNOSIS — M542 Cervicalgia: Secondary | ICD-10-CM | POA: Diagnosis not present

## 2017-12-26 DIAGNOSIS — M9903 Segmental and somatic dysfunction of lumbar region: Secondary | ICD-10-CM | POA: Diagnosis not present

## 2017-12-26 DIAGNOSIS — M546 Pain in thoracic spine: Secondary | ICD-10-CM | POA: Diagnosis not present

## 2017-12-26 DIAGNOSIS — M9902 Segmental and somatic dysfunction of thoracic region: Secondary | ICD-10-CM | POA: Diagnosis not present

## 2017-12-26 DIAGNOSIS — M545 Low back pain: Secondary | ICD-10-CM | POA: Diagnosis not present

## 2017-12-30 DIAGNOSIS — M9901 Segmental and somatic dysfunction of cervical region: Secondary | ICD-10-CM | POA: Diagnosis not present

## 2017-12-30 DIAGNOSIS — M9902 Segmental and somatic dysfunction of thoracic region: Secondary | ICD-10-CM | POA: Diagnosis not present

## 2017-12-30 DIAGNOSIS — M545 Low back pain: Secondary | ICD-10-CM | POA: Diagnosis not present

## 2017-12-30 DIAGNOSIS — M546 Pain in thoracic spine: Secondary | ICD-10-CM | POA: Diagnosis not present

## 2017-12-30 DIAGNOSIS — M542 Cervicalgia: Secondary | ICD-10-CM | POA: Diagnosis not present

## 2017-12-30 DIAGNOSIS — M9903 Segmental and somatic dysfunction of lumbar region: Secondary | ICD-10-CM | POA: Diagnosis not present

## 2018-01-24 ENCOUNTER — Inpatient Hospital Stay: Payer: PPO | Attending: Hematology and Oncology | Admitting: Hematology and Oncology

## 2018-01-24 DIAGNOSIS — Z170 Estrogen receptor positive status [ER+]: Secondary | ICD-10-CM | POA: Insufficient documentation

## 2018-01-24 DIAGNOSIS — Z79811 Long term (current) use of aromatase inhibitors: Secondary | ICD-10-CM | POA: Insufficient documentation

## 2018-01-24 DIAGNOSIS — Z923 Personal history of irradiation: Secondary | ICD-10-CM | POA: Diagnosis not present

## 2018-01-24 DIAGNOSIS — C50411 Malignant neoplasm of upper-outer quadrant of right female breast: Secondary | ICD-10-CM

## 2018-01-24 MED ORDER — FEXOFENADINE HCL 180 MG PO TABS: 180.0000 mg | ORAL_TABLET | Freq: Every day | ORAL | Status: DC

## 2018-01-24 MED ORDER — BLACK CHERRY CONCENTRATE PO LIQD: 2.0000 | Freq: Every day | ORAL | Status: AC

## 2018-01-24 MED ORDER — VITAMIN D 1000 UNITS PO TABS: 1000.0000 [IU] | ORAL_TABLET | Freq: Every day | ORAL | Status: DC

## 2018-01-24 NOTE — Assessment & Plan Note (Signed)
02/17/2017 Right lumpectomy: Invasive ductal carcinoma, grade 2, 0.7 cm, DCIS intermediate grade, margins negative, lymphovascular invasion present, additional lateral margin residual IDC grade 2 with DCIS margins negative, 0/2 lymph nodes, T1b N0 stage IA ER 95%, PR 60%, HER-2 negative, Ki-67 12%  Oncotype DX score 25 Adjuvant radiation 03/30/2017- 04/26/2017  Treatment plan: Adjuvant antiestrogen therapy with letrozole 2.5 mg daily started 05/08/2017  Right breast lumps: 11/05/2017: Mammogram and ultrasound.  Did not show any abnormalities at the site of palpable nodularity.    Letrozole toxicities: Denies any hot flashes or myalgias Return to clinic 1 year for follow-up

## 2018-01-24 NOTE — Progress Notes (Signed)
Patient Care Team: Celene Squibb, MD as PCP - General (Internal Medicine) Brien Few, MD as Consulting Physician (Obstetrics and Gynecology) Rolm Bookbinder, MD as Consulting Physician (General Surgery) Nicholas Lose, MD as Consulting Physician (Hematology and Oncology) Gery Pray, MD as Consulting Physician (Radiation Oncology) Delice Bison Charlestine Massed, NP as Nurse Practitioner (Hematology and Oncology)  DIAGNOSIS:  Encounter Diagnosis  Name Primary?  . 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%, PR 60%, HER-2 negative ratio 1.38, Ki-67 12%, T1aN0 stage IA clinical stage      02/17/2017 Surgery    Right lumpectomy: Invasive ductal carcinoma, grade 2, 0.7 cm, DCIS intermediate grade, margins negative, lymphovascular invasion present, additional lateral margin residual IDC grade 2 with DCIS margins negative, 0/2 lymph nodes, T1b N0 stage IA ER 95%, PR 60%, HER-2 negative, Ki-67 12%      03/15/2017 Oncotype testing    Oncotype DX score 25, intermediate risk: 16% ROR with hormonal therapy alone      03/30/2017 - 04/26/2017 Radiation Therapy     Adjuvant radiation therapy      05/2017 -  Anti-estrogen oral therapy    Letrozole daily       CHIEF COMPLIANT: Follow-up on letrozole therapy  INTERVAL HISTORY: [name] Black is a 67 year old with above-mentioned history of right breast cancer treated with lumpectomy radiation is currently on letrozole therapy.  She appears to be tolerating letrozole fairly well.  She does have fatigue.  She also has a muscle ache in the right thigh.  She is seeing chiropractor for this.  She is  drinking black cherry juice and this appears to be helping her symptoms.  REVIEW OF SYSTEMS:   Constitutional: Denies fevers, chills or abnormal weight loss Eyes: Denies blurriness of vision Ears, nose, mouth, throat, and face: Denies mucositis or sore throat Respiratory: Denies cough, dyspnea or wheezes Cardiovascular: Denies palpitation, chest discomfort Gastrointestinal:  Denies nausea, heartburn or change in bowel habits Skin: Denies abnormal skin rashes Lymphatics: Denies new lymphadenopathy or easy bruising Neurological:Denies numbness, tingling or new weaknesses Behavioral/Psych: Mood is stable, no new changes  Extremities: No lower extremity edema Breast:  denies any pain or lumps or nodules in either breasts All other systems were reviewed with the patient and are negative.  I have reviewed the past medical history, past surgical history, social history and family history with the patient and they are unchanged from previous note.  ALLERGIES:  has No Known Allergies.  MEDICATIONS:  Current Outpatient Medications  Medication Sig Dispense Refill  . b complex vitamins tablet Take 1 tablet by mouth daily.    . calcium carbonate (OS-CAL) 600 MG TABS tablet Take 600 mg by mouth 2 (two) times daily with a meal.    . diphenhydrAMINE (BENADRYL) 25 MG tablet Take 25 mg by mouth as needed.    Nyoka Cowden Tea 150 MG CAPS Take by mouth.    Marland Kitchen ibuprofen (ADVIL,MOTRIN) 200 MG tablet Take 200 mg by mouth as needed.    Marland Kitchen letrozole (FEMARA) 2.5 MG tablet Take 1 tablet (2.5 mg total) by mouth daily. 90 tablet 3  . levothyroxine (SYNTHROID, LEVOTHROID) 112 MCG tablet TAKE ONE (  1) TABLET BY MOUTH EVERY DAY BEFORE BREAKFAST 90 tablet 4  . loratadine (CLARITIN) 10 MG tablet Take 10 mg by mouth as needed for allergies.     . Multiple Vitamins-Minerals (MULTIVITAMINS THER. W/MINERALS) TABS tablet Take 1 tablet by mouth daily.    . Omega-3 Fatty Acids (FISH OIL OMEGA-3 PO) Take by mouth.    Marland Kitchen omeprazole  (PRILOSEC) 20 MG capsule TAKE ONE (1) CAPSULE EACH DAY 30 capsule 5  . TURMERIC PO Take by mouth.    . vitamin C (ASCORBIC ACID) 500 MG tablet Take 500 mg by mouth daily.     No current facility-administered medications for this visit.     PHYSICAL EXAMINATION: ECOG PERFORMANCE STATUS: 1 - Symptomatic but completely ambulatory  Vitals:   01/24/18 1416  BP: (!) 147/86  Pulse: (!) 104  Resp: 17  Temp: 98.7 F (37.1 C)  SpO2: 97%   Filed Weights   01/24/18 1416  Weight: 163 lb 4.8 oz (74.1 kg)    GENERAL:alert, no distress and comfortable SKIN: skin color, texture, turgor are normal, no rashes or significant lesions EYES: normal, Conjunctiva are pink and non-injected, sclera clear OROPHARYNX:no exudate, no erythema and lips, buccal mucosa, and tongue normal  NECK: supple, thyroid normal size, non-tender, without nodularity LYMPH:  no palpable lymphadenopathy in the cervical, axillary or inguinal LUNGS: clear to auscultation and percussion with normal breathing effort HEART: regular rate & rhythm and no murmurs and no lower extremity edema ABDOMEN:abdomen soft, non-tender and normal bowel sounds MUSCULOSKELETAL:no cyanosis of digits and no clubbing  NEURO: alert & oriented x 3 with fluent speech, no focal motor/sensory deficits EXTREMITIES: No lower extremity edema BREAST: No palpable masses or nodules in either right or left breasts. No palpable axillary supraclavicular or infraclavicular adenopathy no breast tenderness or nipple discharge. (exam performed in the presence of a chaperone)  LABORATORY DATA:  I have reviewed the data as listed CMP Latest Ref Rng & Units 01/26/2017 06/25/2014  Glucose 70 - 140 mg/dl 77 87  BUN 7.0 - 26.0 mg/dL 13.6 13  Creatinine 0.6 - 1.1 mg/dL 0.9 0.83  Sodium 136 - 145 mEq/L 142 140  Potassium 3.5 - 5.1 mEq/L 4.8 4.9  Chloride 96 - 112 mEq/L - 104  CO2 22 - 29 mEq/L 31(H) 20  Calcium 8.4 - 10.4 mg/dL 9.8 9.7  Total Protein 6.4 - 8.3 g/dL  7.6 7.2  Total Bilirubin 0.20 - 1.20 mg/dL 0.58 0.5  Alkaline Phos 40 - 150 U/L 84 74  AST 5 - 34 U/L 18 17  ALT 0 - 55 U/L 29 23    Lab Results  Component Value Date   WBC 6.7 01/26/2017   HGB 13.7 01/26/2017   HCT 40.8 01/26/2017   MCV 93.3 01/26/2017   PLT 259 01/26/2017   NEUTROABS 4.4 01/26/2017    ASSESSMENT & PLAN:  Malignant neoplasm of upper-outer quadrant of right breast in female, estrogen receptor positive ([place]) 02/17/2017 Right lumpectomy: Invasive ductal carcinoma, grade 2, 0.7 cm, DCIS intermediate grade, margins negative, lymphovascular invasion present, additional lateral margin residual IDC grade 2 with DCIS margins negative, 0/2 lymph nodes, T1b N0 stage IA ER 95%, PR 60%, HER-2 negative, Ki-67 12%  Oncotype DX score 25 Adjuvant radiation 03/30/2017- 04/26/2017  Treatment plan: Adjuvant antiestrogen therapy with letrozole 2.5 mg daily started 05/08/2017  Right breast lumps: 11/05/2017: Mammogram and ultrasound.  Did not show any abnormalities at the site of palpable nodularity.    Letrozole toxicities:  1.  Muscle pain in the right thigh 2. 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  She is taking calcium naturally and vitamin D supplement. Return to clinic 1 year for follow-up  No orders of the defined types were placed in this encounter.  The patient has a good understanding of the overall plan. she agrees with it. she will call with any problems that may develop before the next visit here.   Harriette Ohara, MD 01/24/18

## 2018-01-25 ENCOUNTER — Telehealth: Payer: Self-pay | Admitting: Hematology and Oncology

## 2018-01-25 NOTE — Telephone Encounter (Signed)
Mailed patient calendar of upcoming may 2020 appointments

## 2018-01-26 ENCOUNTER — Ambulatory Visit
Admission: RE | Admit: 2018-01-26 | Discharge: 2018-01-26 | Disposition: A | Discharge status: Home / Self Care | Payer: PPO | Source: Ambulatory Visit | Attending: Adult Health | Admitting: Adult Health

## 2018-01-26 DIAGNOSIS — Z170 Estrogen receptor positive status [ER+]: Principal | ICD-10-CM

## 2018-01-26 DIAGNOSIS — R928 Other abnormal and inconclusive findings on diagnostic imaging of breast: Secondary | ICD-10-CM | POA: Diagnosis not present

## 2018-01-26 DIAGNOSIS — C50411 Malignant neoplasm of upper-outer quadrant of right female breast: Secondary | ICD-10-CM

## 2018-01-26 HISTORY — DX: Personal history of irradiation: Z92.3

## 2018-03-20 ENCOUNTER — Other Ambulatory Visit: Payer: Self-pay | Admitting: "Endocrinology

## 2018-03-20 DIAGNOSIS — E039 Hypothyroidism, unspecified: Secondary | ICD-10-CM

## 2018-03-21 ENCOUNTER — Other Ambulatory Visit: Payer: Self-pay | Admitting: "Endocrinology

## 2018-03-21 DIAGNOSIS — E039 Hypothyroidism, unspecified: Secondary | ICD-10-CM | POA: Diagnosis not present

## 2018-03-22 LAB — T4, FREE: Free T4: 1.41 ng/dL (ref 0.82–1.77)

## 2018-03-22 LAB — TSH: TSH: 7.76 u[IU]/mL — AB (ref 0.450–4.500)

## 2018-03-23 DIAGNOSIS — C50411 Malignant neoplasm of upper-outer quadrant of right female breast: Secondary | ICD-10-CM | POA: Diagnosis not present

## 2018-03-28 ENCOUNTER — Encounter: Payer: Self-pay | Admitting: "Endocrinology

## 2018-03-28 ENCOUNTER — Telehealth: Payer: Self-pay | Admitting: Hematology and Oncology

## 2018-03-28 ENCOUNTER — Ambulatory Visit (INDEPENDENT_AMBULATORY_CARE_PROVIDER_SITE_OTHER): Payer: PPO | Admitting: "Endocrinology

## 2018-03-28 VITALS — BP 139/83 | HR 72 | Ht 66.0 in | Wt 165.0 lb

## 2018-03-28 DIAGNOSIS — E039 Hypothyroidism, unspecified: Secondary | ICD-10-CM | POA: Diagnosis not present

## 2018-03-28 MED ORDER — LEVOTHYROXINE SODIUM 125 MCG PO TABS: ORAL_TABLET | ORAL | 3 refills | Status: DC

## 2018-03-28 NOTE — Progress Notes (Signed)
Endocrinology follow-up note  Subjective:    Patient ID: [name] Black, female    DOB: [birth year],    Past Medical History:  Diagnosis Date  . Cancer ([place]) 01/2017   right breast cancer  . GERD (gastroesophageal reflux disease)   . H/O seasonal allergies   . History of radiation therapy 03/30/17-04/28/17   right breast was treated to 42.72 Gy, right breast boost 10 Gy  . Hypothyroidism   . Personal history of radiation therapy    Past Surgical History:  Procedure Laterality Date  . BREAST LUMPECTOMY Right 02/17/2017  . COLONOSCOPY    . COLONOSCOPY N/A 01/09/2014   Procedure: COLONOSCOPY;  Surgeon: Rogene Houston, MD;  Location: [facility];  Service: Endoscopy;  Laterality: N/A;  1030  . Cyst removed from left knee    . Fibroid removed and cyst removed from ovary    . RADIOACTIVE SEED GUIDED PARTIAL MASTECTOMY WITH AXILLARY SENTINEL LYMPH NODE BIOPSY Right 02/17/2017   Procedure: RADIOACTIVE SEED GUIDED PARTIAL MASTECTOMY WITH AXILLARY SENTINEL LYMPH NODE BIOPSY;  Surgeon: Rolm Bookbinder, MD;  Location: [facility];  Service: General;  Laterality: Right;  . Right knee arthroscopy     Social History   Socioeconomic History  . Marital status: Married    Spouse name: Not on file  . Number of children: 1  . Years of education: Not on file  . Highest education level: Not on file  Occupational History  . Not on file  Social Needs  . Financial resource strain: Not on file  . Food insecurity:    Worry: Not on file    Inability: Not on file  . Transportation needs:    Medical: Not on file    Non-medical: Not on file  Tobacco Use  . Smoking status: Former Smoker    Packs/day: 0.50    Years: 10.00    Pack years: 5.00    Types: Cigarettes    Last attempt to quit: 03/07/1982    Years since quitting: 36.0  . Smokeless tobacco: Never Used  Substance and Sexual Activity  . Alcohol use: Yes    Alcohol/week: 4.2 oz    Types: 7 Glasses of wine per week     Comment: 1-2 glasses of wine or beer a night  . Drug use: No  . Sexual activity: Not on file  Lifestyle  . Physical activity:    Days per week: Not on file    Minutes per session: Not on file  . Stress: Not on file  Relationships  . Social connections:    Talks on phone: Not on file    Gets together: Not on file    Attends religious service: Not on file    Active member of club or organization: Not on file    Attends meetings of clubs or organizations: Not on file    Relationship status: Not on file  Other Topics Concern  . Not on file  Social History Narrative  . Not on file   Outpatient Encounter Medications as of 03/28/2018  Medication Sig  . b complex vitamins tablet Take 1 tablet by mouth daily.  . cholecalciferol (VITAMIN D) 1000 units tablet Take 1 tablet (1,000 Units total) by mouth daily.  . diphenhydrAMINE (BENADRYL) 25 MG tablet Take 25 mg by mouth as needed.  . fexofenadine (ALLEGRA ALLERGY) 180 MG tablet Take 1 tablet (180 mg total) by mouth daily.  Nyoka Cowden Tea 150 MG CAPS Take by mouth.  Marland Kitchen  ibuprofen (ADVIL,MOTRIN) 200 MG tablet Take 200 mg by mouth as needed.  Marland Kitchen letrozole (FEMARA) 2.5 MG tablet Take 1 tablet (2.5 mg total) by mouth daily.  Marland Kitchen levothyroxine (SYNTHROID, LEVOTHROID) 125 MCG tablet TAKE ONE (1) TABLET BY MOUTH EVERY DAY BEFORE BREAKFAST  . Misc Natural Products (BLACK CHERRY CONCENTRATE) LIQD Take 2 Doses/Fill by mouth daily.  . Multiple Vitamins-Minerals (MULTIVITAMINS THER. W/MINERALS) TABS tablet Take 1 tablet by mouth daily.  . Omega-3 Fatty Acids (FISH OIL OMEGA-3 PO) Take by mouth.  Marland Kitchen omeprazole (PRILOSEC) 20 MG capsule TAKE ONE (1) CAPSULE EACH DAY  . TURMERIC PO Take by mouth.  . vitamin C (ASCORBIC ACID) 500 MG tablet Take 500 mg by mouth daily.  . [DISCONTINUED] levothyroxine (SYNTHROID, LEVOTHROID) 112 MCG tablet TAKE ONE (1) TABLET BY MOUTH EVERY DAY BEFORE BREAKFAST   No facility-administered encounter medications on file as of 03/28/2018.     ALLERGIES: No Known Allergies VACCINATION STATUS:  There is no immunization history on file for this patient.  HPI 67 year old female with long-standing primary hypothyroidism on levothyroxine 112 g by mouth every morning.  She is returning for follow-up with repeat thyroid function tests.  She complains of fatigue, cold intolerance, and weight gain.    Review of Systems  Constitutional: +  Weight gain, + fatigue, no subjective hyperthermia/hypothermia Eyes: no blurry vision, no xerophthalmia ENT: no sore throat, no nodules palpated in throat, no dysphagia/odynophagia, no hoarseness Musculoskeletal: no muscle/joint aches Skin: no rashes Neurological: no tremors/numbness/tingling/dizziness Psychiatric: no depression/anxiety  Objective:    BP 139/83   Pulse 72   Ht 5\' 6"  (1.676 m)   Wt 165 lb (74.8 kg)   BMI 26.63 kg/m   Wt Readings from Last 3 Encounters:  03/28/18 165 lb (74.8 kg)  01/24/18 163 lb 4.8 oz (74.1 kg)  11/18/17 162 lb 12.8 oz (73.8 kg)    Physical Exam  Constitutional: No acute distress, normal state of mind.   Eyes: PERRLA, EOMI, no exophthalmos ENT: moist mucous membranes, Palpable thyroid, no cervical lymphadenopathy  Musculoskeletal: no deformities, strength intact in all 4 Skin: moist, warm, no rashes Neurological: no tremor with outstretched hands  Results for orders placed or performed in visit on 03/21/18  T4, free  Result Value Ref Range   Free T4 1.41 0.82 - 1.77 ng/dL  TSH  Result Value Ref Range   TSH 7.760 (H) 0.450 - 4.500 uIU/mL   Complete Blood Count (Most recent): Lab Results  Component Value Date   WBC 6.7 01/26/2017   HGB 13.7 01/26/2017   HCT 40.8 01/26/2017   MCV 93.3 01/26/2017   PLT 259 01/26/2017   Chemistry (most recent): Lab Results  Component Value Date   NA 142 01/26/2017   K 4.8 01/26/2017   CL 104 06/25/2014   CO2 31 (H) 01/26/2017   BUN 13.6 01/26/2017   CREATININE 0.9 01/26/2017    Assessment &  Plan:   1. Hypothyroidism -Her previsit thyroid function tests are consistent with inadequate replacement for her current weight. -I discussed and increase her levothyroxine to 125 mcg p.o. daily before breakfast .    - We discussed about correct intake of levothyroxine, at fasting, with water, separated by at least 30 minutes from breakfast, and separated by more than 4 hours from calcium, iron, multivitamins, acid reflux medications (PPIs). -Patient is made aware of the fact that thyroid hormone replacement is needed for life, dose to be adjusted by periodic monitoring of thyroid function tests.  She had  subcentimeter nodules will need follow-up ultrasound When she gets appropriate insurance coverage. -She was recently found to have osteopenia, currently on vitamin D supplements, 1000 units daily.  2.  History of prediabetes -Will be considered for A1c on subsequent visits.  Follow up plan: Return in about 3 months (around 06/28/2018) for follow up with pre-visit labs.  Glade Lloyd, MD Phone: [phone]  Fax: [phone]  -  This note was partially dictated with voice recognition software. Similar sounding words can be transcribed inadequately or may not  be corrected upon review.  03/28/2018, 6:15 PM

## 2018-03-28 NOTE — Telephone Encounter (Signed)
Mailed patient calendar of upcoming jan appts per 7/23 sch message.

## 2018-04-04 DIAGNOSIS — M9901 Segmental and somatic dysfunction of cervical region: Secondary | ICD-10-CM | POA: Diagnosis not present

## 2018-04-04 DIAGNOSIS — M9902 Segmental and somatic dysfunction of thoracic region: Secondary | ICD-10-CM | POA: Diagnosis not present

## 2018-04-04 DIAGNOSIS — M9903 Segmental and somatic dysfunction of lumbar region: Secondary | ICD-10-CM | POA: Diagnosis not present

## 2018-04-04 DIAGNOSIS — M545 Low back pain: Secondary | ICD-10-CM | POA: Diagnosis not present

## 2018-04-04 DIAGNOSIS — M2669 Other specified disorders of temporomandibular joint: Secondary | ICD-10-CM | POA: Diagnosis not present

## 2018-04-04 DIAGNOSIS — M546 Pain in thoracic spine: Secondary | ICD-10-CM | POA: Diagnosis not present

## 2018-04-04 DIAGNOSIS — M542 Cervicalgia: Secondary | ICD-10-CM | POA: Diagnosis not present

## 2018-05-02 ENCOUNTER — Other Ambulatory Visit: Payer: Self-pay | Admitting: Hematology and Oncology

## 2018-05-15 DIAGNOSIS — M9902 Segmental and somatic dysfunction of thoracic region: Secondary | ICD-10-CM | POA: Diagnosis not present

## 2018-05-15 DIAGNOSIS — M9903 Segmental and somatic dysfunction of lumbar region: Secondary | ICD-10-CM | POA: Diagnosis not present

## 2018-05-15 DIAGNOSIS — M545 Low back pain: Secondary | ICD-10-CM | POA: Diagnosis not present

## 2018-05-15 DIAGNOSIS — M542 Cervicalgia: Secondary | ICD-10-CM | POA: Diagnosis not present

## 2018-05-15 DIAGNOSIS — M9901 Segmental and somatic dysfunction of cervical region: Secondary | ICD-10-CM | POA: Diagnosis not present

## 2018-05-15 DIAGNOSIS — M546 Pain in thoracic spine: Secondary | ICD-10-CM | POA: Diagnosis not present

## 2018-05-16 ENCOUNTER — Telehealth: Payer: Self-pay

## 2018-05-16 DIAGNOSIS — E039 Hypothyroidism, unspecified: Secondary | ICD-10-CM | POA: Diagnosis not present

## 2018-05-16 NOTE — Telephone Encounter (Signed)
Yes she can go ahead and do her labs, and come sooner for a visit.

## 2018-05-16 NOTE — Telephone Encounter (Signed)
Pt.notified

## 2018-05-16 NOTE — Telephone Encounter (Signed)
Pt states she is still having symptoms such as fatique, weakness, weight gain.  She wants to know if she can go ahead and have the labs done and come in sooner.

## 2018-05-17 ENCOUNTER — Other Ambulatory Visit: Payer: Self-pay | Admitting: "Endocrinology

## 2018-05-17 DIAGNOSIS — R5383 Other fatigue: Secondary | ICD-10-CM

## 2018-05-17 DIAGNOSIS — R739 Hyperglycemia, unspecified: Secondary | ICD-10-CM

## 2018-05-17 DIAGNOSIS — E039 Hypothyroidism, unspecified: Secondary | ICD-10-CM

## 2018-05-17 LAB — T4, FREE: FREE T4: 1.4 ng/dL (ref 0.8–1.8)

## 2018-05-17 LAB — TSH: TSH: 0.52 m[IU]/L (ref 0.40–4.50)

## 2018-05-25 ENCOUNTER — Ambulatory Visit: Payer: PPO | Admitting: "Endocrinology

## 2018-06-12 DIAGNOSIS — M9902 Segmental and somatic dysfunction of thoracic region: Secondary | ICD-10-CM | POA: Diagnosis not present

## 2018-06-12 DIAGNOSIS — M542 Cervicalgia: Secondary | ICD-10-CM | POA: Diagnosis not present

## 2018-06-12 DIAGNOSIS — M9901 Segmental and somatic dysfunction of cervical region: Secondary | ICD-10-CM | POA: Diagnosis not present

## 2018-06-12 DIAGNOSIS — M545 Low back pain: Secondary | ICD-10-CM | POA: Diagnosis not present

## 2018-06-12 DIAGNOSIS — M546 Pain in thoracic spine: Secondary | ICD-10-CM | POA: Diagnosis not present

## 2018-06-12 DIAGNOSIS — M9903 Segmental and somatic dysfunction of lumbar region: Secondary | ICD-10-CM | POA: Diagnosis not present

## 2018-06-20 ENCOUNTER — Other Ambulatory Visit: Payer: Self-pay | Admitting: "Endocrinology

## 2018-06-20 DIAGNOSIS — R5383 Other fatigue: Secondary | ICD-10-CM | POA: Diagnosis not present

## 2018-06-20 DIAGNOSIS — Z0001 Encounter for general adult medical examination with abnormal findings: Secondary | ICD-10-CM | POA: Diagnosis not present

## 2018-06-20 DIAGNOSIS — R739 Hyperglycemia, unspecified: Secondary | ICD-10-CM | POA: Diagnosis not present

## 2018-06-20 LAB — BASIC METABOLIC PANEL
BUN: 8 (ref 4–21)
CREATININE: 0.8 (ref 0.5–1.1)

## 2018-06-20 LAB — LIPID PANEL
Cholesterol: 201 — AB (ref 0–200)
HDL: 59 (ref 35–70)
LDL Cholesterol: 120
Triglycerides: 108 (ref 40–160)

## 2018-06-21 LAB — T4, FREE: FREE T4: 1.58 ng/dL (ref 0.82–1.77)

## 2018-06-21 LAB — TSH: TSH: 0.613 u[IU]/mL (ref 0.450–4.500)

## 2018-06-21 LAB — HGB A1C W/O EAG: Hgb A1c MFr Bld: 5.6 % (ref 4.8–5.6)

## 2018-06-23 DIAGNOSIS — Z853 Personal history of malignant neoplasm of breast: Secondary | ICD-10-CM | POA: Diagnosis not present

## 2018-06-23 DIAGNOSIS — Z6826 Body mass index (BMI) 26.0-26.9, adult: Secondary | ICD-10-CM | POA: Diagnosis not present

## 2018-06-23 DIAGNOSIS — Z0001 Encounter for general adult medical examination with abnormal findings: Secondary | ICD-10-CM | POA: Diagnosis not present

## 2018-06-23 DIAGNOSIS — R030 Elevated blood-pressure reading, without diagnosis of hypertension: Secondary | ICD-10-CM | POA: Diagnosis not present

## 2018-06-23 DIAGNOSIS — Z23 Encounter for immunization: Secondary | ICD-10-CM | POA: Diagnosis not present

## 2018-06-23 DIAGNOSIS — E039 Hypothyroidism, unspecified: Secondary | ICD-10-CM | POA: Diagnosis not present

## 2018-06-23 DIAGNOSIS — K219 Gastro-esophageal reflux disease without esophagitis: Secondary | ICD-10-CM | POA: Diagnosis not present

## 2018-06-27 ENCOUNTER — Ambulatory Visit (INDEPENDENT_AMBULATORY_CARE_PROVIDER_SITE_OTHER): Payer: PPO | Admitting: "Endocrinology

## 2018-06-27 ENCOUNTER — Encounter: Payer: Self-pay | Admitting: "Endocrinology

## 2018-06-27 VITALS — BP 133/89 | HR 74 | Ht 66.0 in | Wt 165.0 lb

## 2018-06-27 DIAGNOSIS — E039 Hypothyroidism, unspecified: Secondary | ICD-10-CM | POA: Diagnosis not present

## 2018-06-27 MED ORDER — LEVOTHYROXINE SODIUM 125 MCG PO TABS: ORAL_TABLET | ORAL | 2 refills | Status: DC

## 2018-06-27 NOTE — Progress Notes (Signed)
Endocrinology follow-up note  Subjective:    Patient ID: [name] Black, female    DOB: February 24, 1951,    Past Medical History:  Diagnosis Date  . Cancer ([place]) 01/2017   right breast cancer  . GERD (gastroesophageal reflux disease)   . H/O seasonal allergies   . History of radiation therapy 03/30/17-04/28/17   right breast was treated to 42.72 Gy, right breast boost 10 Gy  . Hypothyroidism   . Personal history of radiation therapy    Past Surgical History:  Procedure Laterality Date  . BREAST LUMPECTOMY Right 02/17/2017  . COLONOSCOPY    . COLONOSCOPY N/A 01/09/2014   Procedure: COLONOSCOPY;  Surgeon: Rogene Houston, MD;  Location: [facility];  Service: Endoscopy;  Laterality: N/A;  1030  . Cyst removed from left knee    . Fibroid removed and cyst removed from ovary    . RADIOACTIVE SEED GUIDED PARTIAL MASTECTOMY WITH AXILLARY SENTINEL LYMPH NODE BIOPSY Right 02/17/2017   Procedure: RADIOACTIVE SEED GUIDED PARTIAL MASTECTOMY WITH AXILLARY SENTINEL LYMPH NODE BIOPSY;  Surgeon: Rolm Bookbinder, MD;  Location: [place];  Service: General;  Laterality: Right;  . Right knee arthroscopy     Social History   Socioeconomic History  . Marital status: Married    Spouse name: Not on file  . Number of children: 1  . Years of education: Not on file  . Highest education level: Not on file  Occupational History  . Not on file  Social Needs  . Financial resource strain: Not on file  . Food insecurity:    Worry: Not on file    Inability: Not on file  . Transportation needs:    Medical: Not on file    Non-medical: Not on file  Tobacco Use  . Smoking status: Former Smoker    Packs/day: 0.50    Years: 10.00    Pack years: 5.00    Types: Cigarettes    Last attempt to quit: 03/07/1982    Years since quitting: 36.3  . Smokeless tobacco: Never Used  Substance and Sexual Activity  . Alcohol use: Yes    Alcohol/week: 7.0 standard drinks    Types: 7 Glasses of wine  per week    Comment: 1-2 glasses of wine or beer a night  . Drug use: No  . Sexual activity: Not on file  Lifestyle  . Physical activity:    Days per week: Not on file    Minutes per session: Not on file  . Stress: Not on file  Relationships  . Social connections:    Talks on phone: Not on file    Gets together: Not on file    Attends religious service: Not on file    Active member of club or organization: Not on file    Attends meetings of clubs or organizations: Not on file    Relationship status: Not on file  Other Topics Concern  . Not on file  Social History Narrative  . Not on file   Outpatient Encounter Medications as of 06/27/2018  Medication Sig  . CALCIUM CARBONATE-VITAMIN D PO Take by mouth daily.   Marland Kitchen b complex vitamins tablet Take 1 tablet by mouth daily.  . diphenhydrAMINE (BENADRYL) 25 MG tablet Take 25 mg by mouth as needed.  . fexofenadine (ALLEGRA ALLERGY) 180 MG tablet Take 1 tablet (180 mg total) by mouth daily.  Nyoka Cowden Tea 150 MG CAPS Take by mouth.  Marland Kitchen ibuprofen (ADVIL,MOTRIN) 200 MG  tablet Take 200 mg by mouth as needed.  Marland Kitchen letrozole (FEMARA) 2.5 MG tablet TAKE ONE (1) TABLET BY MOUTH EVERY DAY  . levothyroxine (SYNTHROID, LEVOTHROID) 125 MCG tablet TAKE ONE (1) TABLET BY MOUTH EVERY DAY BEFORE BREAKFAST  . Misc Natural Products (BLACK CHERRY CONCENTRATE) LIQD Take 2 Doses/Fill by mouth daily.  . Multiple Vitamins-Minerals (MULTIVITAMINS THER. W/MINERALS) TABS tablet Take 1 tablet by mouth daily.  . Omega-3 Fatty Acids (FISH OIL OMEGA-3 PO) Take by mouth.  Marland Kitchen omeprazole (PRILOSEC) 20 MG capsule TAKE ONE (1) CAPSULE EACH DAY  . [DISCONTINUED] cholecalciferol (VITAMIN D) 1000 units tablet Take 1 tablet (1,000 Units total) by mouth daily.  . [DISCONTINUED] levothyroxine (SYNTHROID, LEVOTHROID) 125 MCG tablet TAKE ONE (1) TABLET BY MOUTH EVERY DAY BEFORE BREAKFAST  . [DISCONTINUED] TURMERIC PO Take by mouth.  . [DISCONTINUED] vitamin C (ASCORBIC ACID) 500 MG  tablet Take 500 mg by mouth daily.   No facility-administered encounter medications on file as of 06/27/2018.    ALLERGIES: No Known Allergies VACCINATION STATUS:  There is no immunization history on file for this patient.  HPI 67 year old female with long-standing primary hypothyroidism on levothyroxine 125 micrograms p.o. every morning before breakfast.  She reports compliance with medication.  She is returning with repeat thyroid function test.  She has no new complaints today.  She reports feeling better, stable weight, no palpitations, heat or cold intolerance.      Review of Systems  Constitutional: + steady Weight,  + fatigue, no subjective hyperthermia/hypothermia Eyes: no blurry vision, no xerophthalmia ENT: no sore throat, no nodules palpated in throat, no dysphagia/odynophagia, no hoarseness Musculoskeletal: no muscle/joint aches Skin: no rashes Neurological: no tremors/numbness/tingling/dizziness Psychiatric: no depression/anxiety  Objective:    BP 133/89   Pulse 74   Ht 5\' 6"  (1.676 m)   Wt 165 lb (74.8 kg)   BMI 26.63 kg/m   Wt Readings from Last 3 Encounters:  06/27/18 165 lb (74.8 kg)  03/28/18 165 lb (74.8 kg)  01/24/18 163 lb 4.8 oz (74.1 kg)    Physical Exam  Constitutional: No acute distress, normal state of mind.   Eyes: PERRLA, EOMI, no exophthalmos ENT: moist mucous membranes, Palpable thyroid, no cervical lymphadenopathy  Musculoskeletal: no deformities, strength intact in all 4 Skin: moist, warm, no rashes Neurological: no tremor with outstretched hands  Results for orders placed or performed in visit on 20/25/42  Basic metabolic panel  Result Value Ref Range   BUN 8 4 - 21   Creatinine 0.8 0.5 - 1.1  Lipid panel  Result Value Ref Range   Triglycerides 108 40 - 160   Cholesterol 201 (A) 0 - 200   HDL 59 35 - 70   LDL Cholesterol 120    Complete Blood Count (Most recent): Lab Results  Component Value Date   WBC 6.7 01/26/2017    HGB 13.7 01/26/2017   HCT 40.8 01/26/2017   MCV 93.3 01/26/2017   PLT 259 01/26/2017   Chemistry (most recent): Lab Results  Component Value Date   NA 142 01/26/2017   K 4.8 01/26/2017   CL 104 06/25/2014   CO2 31 (H) 01/26/2017   BUN 8 06/20/2018   CREATININE 0.8 06/20/2018    Recent Results (from the past 2160 hour(s))  TSH     Status: None   Collection Time: 05/16/18  2:40 PM  Result Value Ref Range   TSH 0.52 0.40 - 4.50 mIU/L  T4, free     Status: None  Collection Time: 05/16/18  2:40 PM  Result Value Ref Range   Free T4 1.4 0.8 - 1.8 ng/dL  Hgb A1c w/o eAG     Status: None   Collection Time: 06/20/18 12:00 AM  Result Value Ref Range   Hgb A1c MFr Bld 5.6 4.8 - 5.6 %    Comment:          Prediabetes: 5.7 - 6.4          Diabetes: >6.4          Glycemic control for adults with diabetes: <7.0   T4, free     Status: None   Collection Time: 06/20/18 12:00 AM  Result Value Ref Range   Free T4 1.58 0.82 - 1.77 ng/dL  TSH     Status: None   Collection Time: 06/20/18 12:00 AM  Result Value Ref Range   TSH 0.613 0.450 - 4.500 uIU/mL  Basic metabolic panel     Status: None   Collection Time: 06/20/18 12:00 AM  Result Value Ref Range   BUN 8 4 - 21   Creatinine 0.8 0.5 - 1.1  Lipid panel     Status: Abnormal   Collection Time: 06/20/18 12:00 AM  Result Value Ref Range   Triglycerides 108 40 - 160   Cholesterol 201 (A) 0 - 200   HDL 59 35 - 70   LDL Cholesterol 120     Assessment & Plan:   1. Hypothyroidism -Her previsit thyroid function tests are consistent with appropriate replacement with levothyroxine.   -She is advised to continue  levothyroxine to 125 mcg p.o. daily before breakfast .    - We discussed about correct intake of levothyroxine, at fasting, with water, separated by at least 30 minutes from breakfast, and separated by more than 4 hours from calcium, iron, multivitamins, acid reflux medications (PPIs). -Patient is made aware of the fact that  thyroid hormone replacement is needed for life, dose to be adjusted by periodic monitoring of thyroid function tests. She had subcentimeter nodules will need follow-up ultrasound When she gets appropriate insurance coverage. -She was recently found to have osteopenia, currently on vitamin D supplements, 1000 units daily.  2.  History of prediabetes -Repeat A1c is 5.6%, stable from last year when it was 5.4%.  She does not need any medications for intervention.   -  Suggestion is made for her to avoid simple carbohydrates  from her diet including Cakes, Sweet Desserts / Pastries, Ice Cream, Soda (diet and regular), Sweet Tea, Candies, Chips, Cookies, Store Bought Juices, Alcohol in Excess of  1-2 drinks a day, Artificial Sweeteners, and "Sugar-free" Products. This will help patient to have stable blood glucose profile and potentially avoid unintended weight gain.  3.  Dyslipidemia-LDL at 120, proving from 138 in 2018. -She wishes to avoid treatment with statins for now.  She will need repeat fasting lipid panel in 1 year.  Follow up plan: Return in about 6 months (around 12/27/2018) for Follow up with Pre-visit Labs.  Glade Lloyd, MD Phone: [phone]  Fax: [phone]  -  This note was partially dictated with voice recognition software. Similar sounding words can be transcribed inadequately or may not  be corrected upon review.  06/27/2018, 12:43 PM

## 2018-07-25 ENCOUNTER — Encounter: Payer: Self-pay | Admitting: Hematology and Oncology

## 2018-07-25 NOTE — Progress Notes (Signed)
Patient called regarding whether or not provider participates in insurance chosen. Referred patient back to her insurance company whom should have a list of participating providers. She states she called and tried to look online and they were unable to locate him. Advised she may want to contact them again to confirm. She verbalized understanding.

## 2018-07-26 DIAGNOSIS — R945 Abnormal results of liver function studies: Secondary | ICD-10-CM | POA: Diagnosis not present

## 2018-07-26 DIAGNOSIS — R8290 Unspecified abnormal findings in urine: Secondary | ICD-10-CM | POA: Diagnosis not present

## 2018-08-04 ENCOUNTER — Other Ambulatory Visit: Payer: Self-pay | Admitting: Hematology and Oncology

## 2018-09-04 ENCOUNTER — Ambulatory Visit (HOSPITAL_COMMUNITY)
Admission: RE | Admit: 2018-09-04 | Discharge: 2018-09-04 | Disposition: A | Discharge status: Home / Self Care | Payer: PPO | Source: Ambulatory Visit | Attending: Internal Medicine | Admitting: Internal Medicine

## 2018-09-04 ENCOUNTER — Other Ambulatory Visit (HOSPITAL_COMMUNITY): Payer: Self-pay | Admitting: Internal Medicine

## 2018-09-04 DIAGNOSIS — M25512 Pain in left shoulder: Secondary | ICD-10-CM | POA: Insufficient documentation

## 2018-09-04 DIAGNOSIS — M19012 Primary osteoarthritis, left shoulder: Secondary | ICD-10-CM | POA: Diagnosis not present

## 2018-09-22 ENCOUNTER — Telehealth: Payer: Self-pay | Admitting: Hematology and Oncology

## 2018-09-22 NOTE — Telephone Encounter (Signed)
Called pt re appt being changed due to [facility] - spoke w/ pt re appts.

## 2018-09-26 ENCOUNTER — Ambulatory Visit: Payer: PPO | Admitting: Hematology and Oncology

## 2018-09-27 ENCOUNTER — Ambulatory Visit: Payer: PPO | Admitting: Hematology and Oncology

## 2018-10-03 NOTE — Progress Notes (Signed)
Patient Care Team: Celene Squibb, MD as PCP - General (Internal Medicine) Brien Few, MD as Consulting Physician (Obstetrics and Gynecology) Rolm Bookbinder, MD as Consulting Physician (General Surgery) Nicholas Lose, MD as Consulting Physician (Hematology and Oncology) Gery Pray, MD as Consulting Physician (Radiation Oncology) Delice Bison Charlestine Massed, NP as Nurse Practitioner (Hematology and Oncology)  DIAGNOSIS:    ICD-10-CM   1. Osteopenia of hip, unspecified laterality M85.859 DG Bone Density  2. Malignant neoplasm of upper-outer quadrant of right breast in female, estrogen receptor positive ([place]) C50.411    Z17.0     SUMMARY OF ONCOLOGIC HISTORY:   Malignant neoplasm of upper-outer quadrant of right breast in female, estrogen receptor positive ([place])   01/19/2017 Initial Diagnosis    Screening detected right breast mass 5 mm size axilla negative biopsy: Grade 2 invasive ductal carcinoma with DCIS ER 95%, PR 60%, HER-2 negative ratio 1.38, Ki-67 12%, T1aN0 stage IA clinical stage    02/17/2017 Surgery    Right lumpectomy: Invasive ductal carcinoma, grade 2, 0.7 cm, DCIS intermediate grade, margins negative, lymphovascular invasion present, additional lateral margin residual IDC grade 2 with DCIS margins negative, 0/2 lymph nodes, T1b N0 stage IA ER 95%, PR 60%, HER-2 negative, Ki-67 12%    03/15/2017 Oncotype testing    Oncotype DX score 25, intermediate risk: 16% ROR with hormonal therapy alone    03/30/2017 - 04/26/2017 Radiation Therapy     Adjuvant radiation therapy    05/2017 -  Anti-estrogen oral therapy    Letrozole daily     CHIEF COMPLIANT: Follow-up on letrozole therapy  INTERVAL HISTORY: [name] Black is a 68 y.o. with above-mentioned history of right breast cancer treated with lumpectomy, radiation, and is currently on letrozole therapy. She presents to the clinic alone today and is doing okay. She fell on her left shoulder on 09/03/18 and her rotation  and movement are severely limited. She is seeing an orthopedic surgeon for this. She notes balance issues recently and regular hot flashes that have remained constant and not worsened. She notes joint aches and stiffness in her legs, especially after sitting, that improve with movement. She notes aches in her right breast and right arm. She questioned if letrozole could change the size of her breasts over time. She does not want to take medication for her osteopenia but takes calcium supplements. 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 She reviewed her medication list with me.   REVIEW OF SYSTEMS:   Constitutional: Denies fevers, chills or abnormal weight loss (+) hot flashes (+) difficulty balancing Eyes: Denies blurriness of vision Ears, nose, mouth, throat, and face: Denies mucositis or sore throat Respiratory: Denies cough, dyspnea or wheezes Cardiovascular: Denies palpitation, chest discomfort Gastrointestinal:  Denies nausea, heartburn or change in bowel habits Skin: Denies abnormal skin rashes MSK: (+) shoulder pain, limited movement (+) joint aches and pains, legs  Lymphatics: Denies easy bruising or new lymphedema Neurological: Denies numbness, tingling or new weaknesses Behavioral/Psych: Mood is stable, no new changes  Extremities: No lower extremity edema, left shoulder pain and discomfort Breast: denies any lumps or nodules in either breasts (+) pain, soreness in right breast and arm All other systems were reviewed with the patient and are negative.  I have reviewed the past medical history, past surgical history, social history and family history with the patient and they are unchanged from previous note.  ALLERGIES:  has No  Known Allergies.  MEDICATIONS:  Current Outpatient Medications  Medication Sig Dispense Refill  . b complex vitamins tablet Take 1 tablet by mouth daily.    Marland Kitchen CALCIUM CARBONATE-VITAMIN  D PO Take by mouth daily.     . diphenhydrAMINE (BENADRYL) 25 MG tablet Take 25 mg by mouth as needed.    . fexofenadine (ALLEGRA ALLERGY) 180 MG tablet Take 1 tablet (180 mg total) by mouth daily.    Nyoka Cowden Tea 150 MG CAPS Take by mouth.    Marland Kitchen ibuprofen (ADVIL,MOTRIN) 200 MG tablet Take 200 mg by mouth as needed.    Marland Kitchen letrozole (FEMARA) 2.5 MG tablet TAKE ONE (1) TABLET BY MOUTH EVERY DAY 90 tablet 3  . levothyroxine (SYNTHROID, LEVOTHROID) 125 MCG tablet TAKE ONE (1) TABLET BY MOUTH EVERY DAY BEFORE BREAKFAST 90 tablet 2  . Misc Natural Products (BLACK CHERRY CONCENTRATE) LIQD Take 2 Doses/Fill by mouth daily.    . Multiple Vitamins-Minerals (MULTIVITAMINS THER. W/MINERALS) TABS tablet Take 1 tablet by mouth daily.    . Omega-3 Fatty Acids (FISH OIL OMEGA-3 PO) Take by mouth.    Marland Kitchen omeprazole (PRILOSEC) 20 MG capsule TAKE ONE (1) CAPSULE EACH DAY 30 capsule 5   No current facility-administered medications for this visit.     PHYSICAL EXAMINATION: ECOG PERFORMANCE STATUS: 1 - Symptomatic but completely ambulatory  Vitals:   10/04/18 1008  BP: 132/85  Pulse: 97  Resp: 18  Temp: 98.5 F (36.9 C)  SpO2: 99%   Filed Weights   10/04/18 1008  Weight: 162 lb 12.8 oz (73.8 kg)    GENERAL: alert, no distress and comfortable SKIN: skin color, texture, turgor are normal, no rashes or significant lesions EYES: normal, Conjunctiva are pink and non-injected, sclera clear OROPHARYNX: no exudate, no erythema and lips, buccal mucosa, and tongue normal  NECK: supple, thyroid normal size, non-tender, without nodularity LYMPH: no palpable lymphadenopathy in the cervical, axillary or inguinal LUNGS: clear to auscultation and percussion with normal breathing effort HEART: regular rate & rhythm and no murmurs and no lower extremity edema ABDOMEN: abdomen soft, non-tender and normal bowel sounds MUSCULOSKELETAL: no cyanosis of digits and no clubbing NEURO: alert & oriented x 3 with fluent speech,  no focal motor/sensory deficits EXTREMITIES: No lower extremity edema, left shoulder limited range of motion BREAST: No palpable masses or nodules in either right or left breasts. No palpable axillary supraclavicular or infraclavicular adenopathy no breast tenderness or nipple discharge. (exam performed in the presence of a chaperone)  LABORATORY DATA:  I have reviewed the data as listed CMP Latest Ref Rng & Units 06/20/2018 01/26/2017 06/25/2014  Glucose 70 - 140 mg/dl - 77 87  BUN 4 - 21 8 13.6 13  Creatinine 0.5 - 1.1 0.8 0.9 0.83  Sodium 136 - 145 mEq/L - 142 140  Potassium 3.5 - 5.1 mEq/L - 4.8 4.9  Chloride 96 - 112 mEq/L - - 104  CO2 22 - 29 mEq/L - 31(H) 20  Calcium 8.4 - 10.4 mg/dL - 9.8 9.7  Total Protein 6.4 - 8.3 g/dL - 7.6 7.2  Total Bilirubin 0.20 - 1.20 mg/dL - 0.58 0.5  Alkaline Phos 40 - 150 U/L - 84 74  AST 5 - 34 U/L - 18 17  ALT 0 - 55 U/L - 29 23    Lab Results  Component Value Date   WBC 6.7 01/26/2017   HGB 13.7 01/26/2017   HCT 40.8 01/26/2017   MCV 93.3 01/26/2017   PLT 259  01/26/2017   NEUTROABS 4.4 01/26/2017    ASSESSMENT & PLAN:  Malignant neoplasm of upper-outer quadrant of right breast in female, estrogen receptor positive ([place]) 02/17/2017 Right lumpectomy: Invasive ductal carcinoma, grade 2, 0.7 cm, DCIS intermediate grade, margins negative, lymphovascular invasion present, additional lateral margin residual IDC grade 2 with DCIS margins negative, 0/2 lymph nodes, T1b N0 stage IA ER 95%, PR 60%, HER-2 negative, Ki-67 12%  Oncotype DX score 25 Adjuvant radiation 03/30/2017- 04/26/2017  Treatment plan: Adjuvant antiestrogen therapy withletrozole 2.5 mg daily started 05/08/2017  Letrozole toxicities:  1.  Muscle pain and stiffness of her muscles which gets better with activity 2. 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  She is taking calcium naturally and vitamin D  supplement.  Left shoulder pain: Probably rotator cuff injury, patient is scheduled to get an MRI of the shoulder, seeing orthopedics.  Return to clinic 1 year for follow-up    Orders Placed This Encounter  Procedures  . DG Bone Density    Standing Status:   Future    Standing Expiration Date:   10/04/2019    Order Specific Question:   Reason for Exam (SYMPTOM  OR DIAGNOSIS REQUIRED)    Answer:   Osteopenia    Order Specific Question:   Preferred imaging location?    Answer:   [facility]   The patient has a good understanding of the overall plan. she agrees with it. she will call with any problems that may develop before the next visit here.  Nicholas Lose, MD 10/04/2018  Julious Oka Dorshimer am acting as scribe for Dr. Nicholas Lose.  I have reviewed the above documentation for accuracy and completeness, and I agree with the above.

## 2018-10-04 ENCOUNTER — Telehealth: Payer: Self-pay | Admitting: Hematology and Oncology

## 2018-10-04 ENCOUNTER — Inpatient Hospital Stay: Payer: Medicare HMO | Attending: Hematology and Oncology | Admitting: Hematology and Oncology

## 2018-10-04 VITALS — BP 132/85 | HR 97 | Temp 98.5°F | Resp 18 | Ht 66.0 in | Wt 162.8 lb

## 2018-10-04 DIAGNOSIS — Z79899 Other long term (current) drug therapy: Secondary | ICD-10-CM | POA: Insufficient documentation

## 2018-10-04 DIAGNOSIS — Z170 Estrogen receptor positive status [ER+]: Secondary | ICD-10-CM | POA: Diagnosis not present

## 2018-10-04 DIAGNOSIS — Z79811 Long term (current) use of aromatase inhibitors: Secondary | ICD-10-CM | POA: Insufficient documentation

## 2018-10-04 DIAGNOSIS — C50411 Malignant neoplasm of upper-outer quadrant of right female breast: Secondary | ICD-10-CM | POA: Diagnosis not present

## 2018-10-04 DIAGNOSIS — Z923 Personal history of irradiation: Secondary | ICD-10-CM | POA: Insufficient documentation

## 2018-10-04 DIAGNOSIS — M85859 Other specified disorders of bone density and structure, unspecified thigh: Secondary | ICD-10-CM

## 2018-10-04 MED ORDER — LETROZOLE 2.5 MG PO TABS: ORAL_TABLET | ORAL | 3 refills | Status: DC

## 2018-10-04 NOTE — Assessment & Plan Note (Addendum)
02/17/2017 Right lumpectomy: Invasive ductal carcinoma, grade 2, 0.7 cm, DCIS intermediate grade, margins negative, lymphovascular invasion present, additional lateral margin residual IDC grade 2 with DCIS margins negative, 0/2 lymph nodes, T1b N0 stage IA ER 95%, PR 60%, HER-2 negative, Ki-67 12%  Oncotype DX score 25 Adjuvant radiation 03/30/2017- 04/26/2017  Treatment plan: Adjuvant antiestrogen therapy withletrozole 2.5 mg daily started 05/08/2017  Letrozole toxicities:  1.  Muscle pain and stiffness of her muscles which gets better with activity 2. 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  She is taking calcium naturally and vitamin D supplement.  Left shoulder pain: Probably rotator cuff injury, patient is scheduled to get an MRI of the shoulder, seeing orthopedics.  Return to clinic 1 year for follow-up

## 2018-10-04 NOTE — Telephone Encounter (Signed)
Gave AVS and calendar °

## 2018-10-25 IMAGING — [facility] DIGITAL DIAGNOSTIC BILATERAL MAMMOGRAM WITH TOMO AND CAD
9 series · 9 of 25 positions shown · non-contrast
Comparison: Previous exam(s).

CLINICAL DATA: Status post right lumpectomy and radiation therapy
for breast cancer in 5743.

EXAM:
DIGITAL DIAGNOSTIC BILATERAL MAMMOGRAM WITH CAD AND TOMO

[R CC]
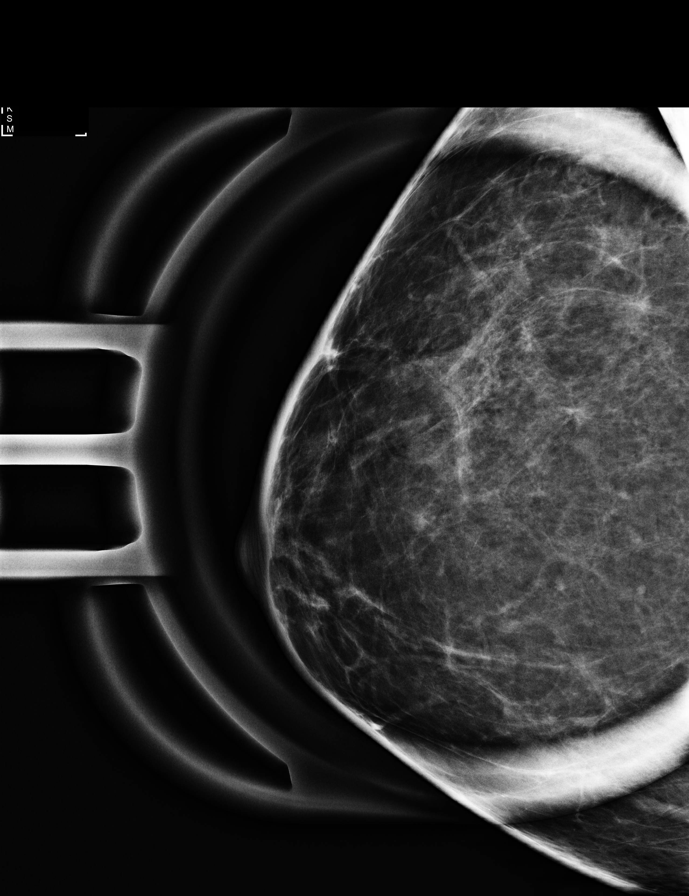

[R MLO synth-2D]
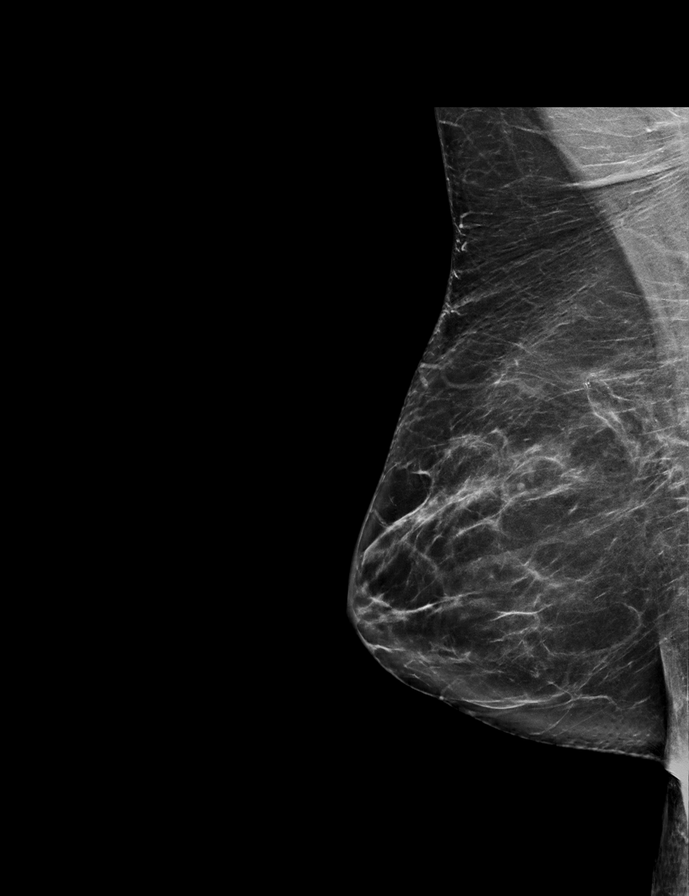

[L CC synth-2D]
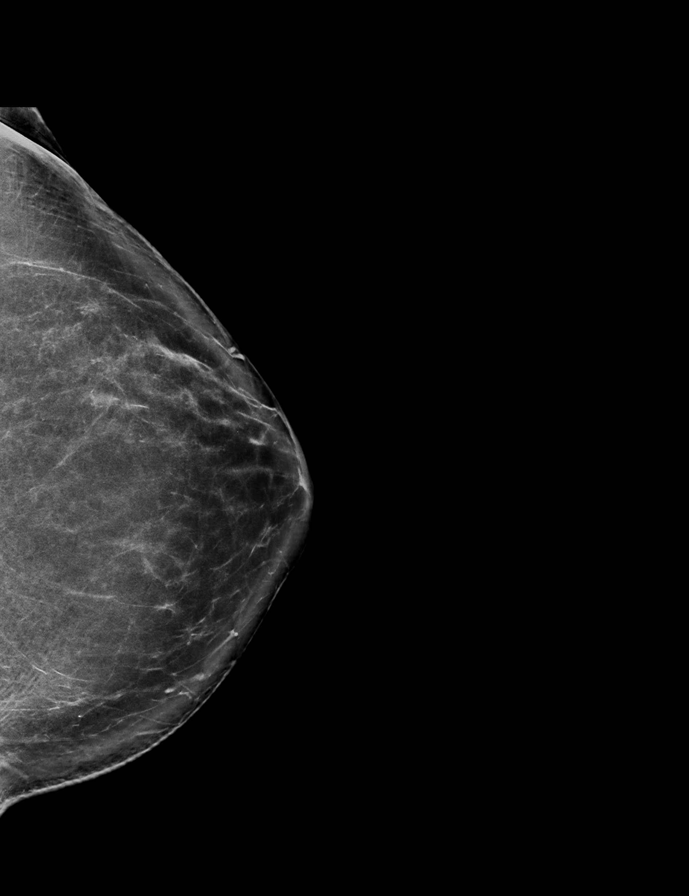

[R CC synth-2D]
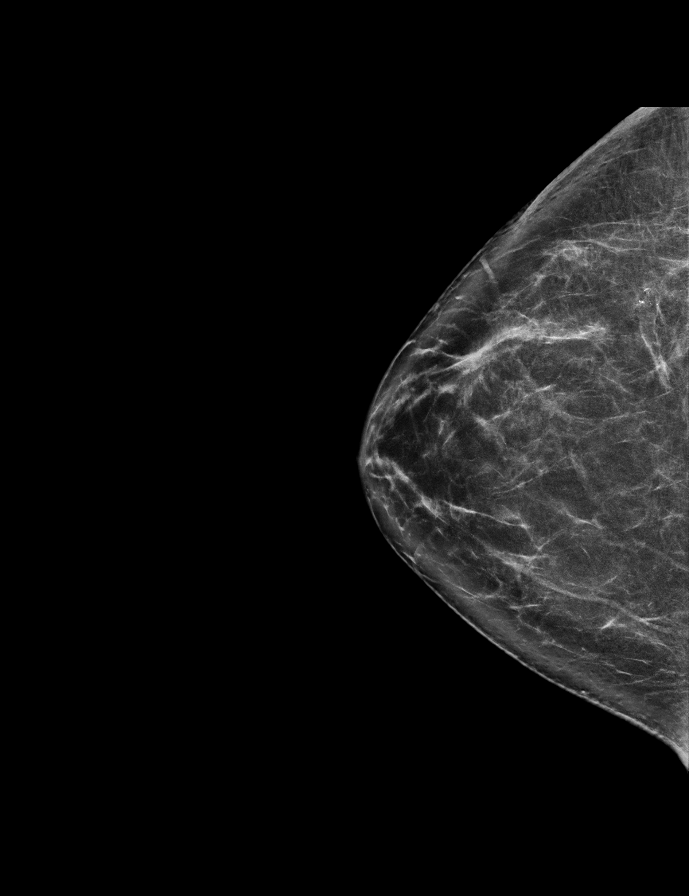

[L MLO synth-2D]
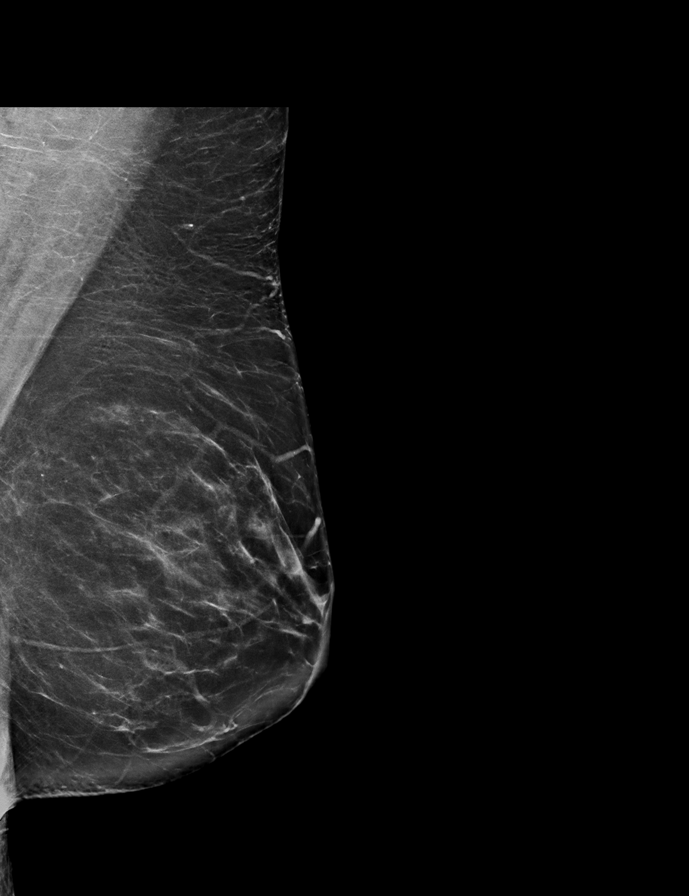

[R CC tomo · tomo slice 34/67.0]
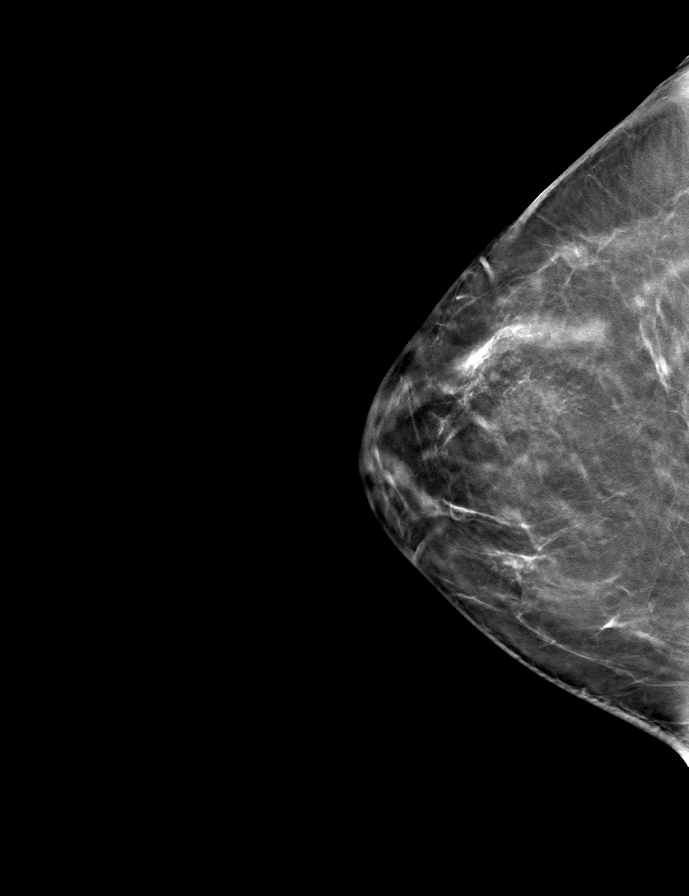

[L MLO tomo · tomo slice 39/76.0]
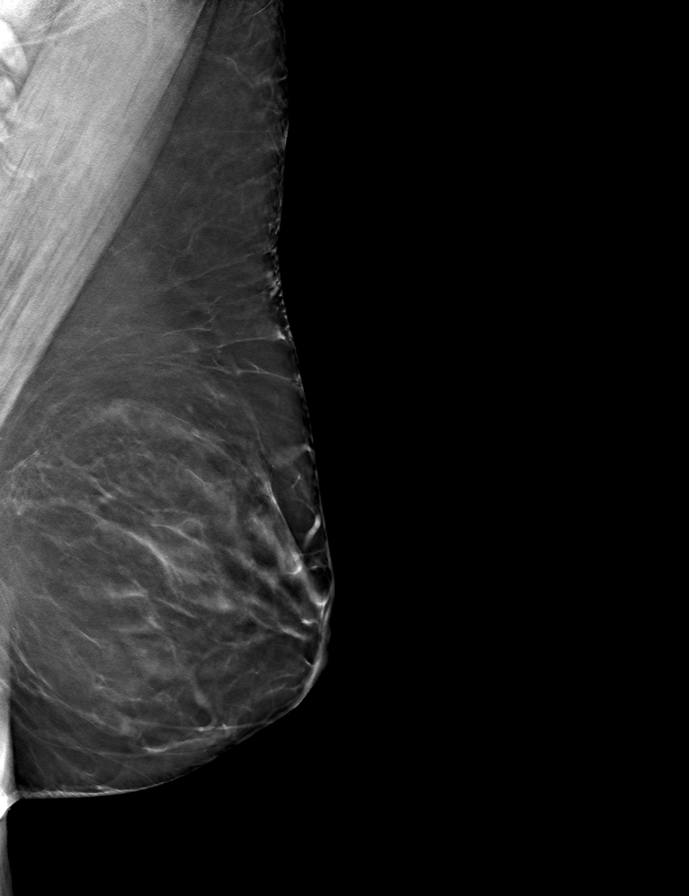

[L CC tomo · tomo slice 41/80.0]
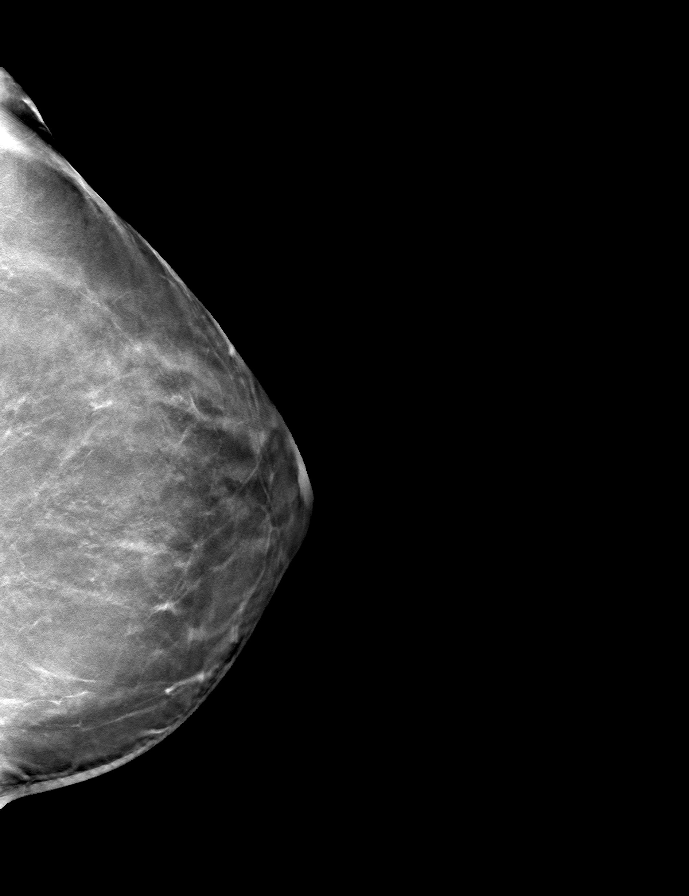

[R MLO tomo · tomo slice 37/73.0]
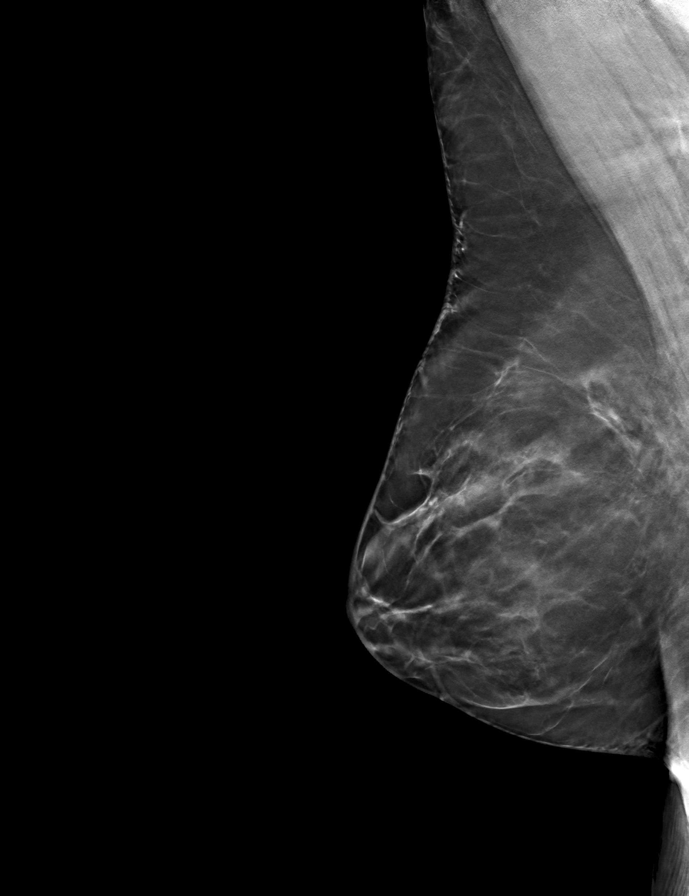

[9 of 25 positions shown; findings below may reference images not displayed]

ACR Breast Density Category b: There are scattered areas of
fibroglandular density.
FINDINGS: Interval post lumpectomy changes on the right with a small area of
associated fat necrosis with oil cyst formation and dystrophic
calcifications. The previously seen mass in the lateral right breast
is surgically absent. No interval findings suspicious for malignancy
in either breast.

Mammographic images were processed with CAD.
IMPRESSION: No evidence of malignancy.

RECOMMENDATION:
Bilateral diagnostic mammogram in 1 year.

I have discussed the findings and recommendations with the patient.
Results were also provided in writing at the conclusion of the
visit. If applicable, a reminder letter will be sent to the patient
regarding the next appointment.

BI-RADS CATEGORY  2: Benign.

## 2018-11-29 DIAGNOSIS — M7502 Adhesive capsulitis of left shoulder: Secondary | ICD-10-CM | POA: Insufficient documentation

## 2018-12-25 ENCOUNTER — Other Ambulatory Visit: Payer: Self-pay | Admitting: Adult Health

## 2018-12-25 DIAGNOSIS — Z853 Personal history of malignant neoplasm of breast: Secondary | ICD-10-CM

## 2018-12-26 ENCOUNTER — Ambulatory Visit: Payer: PPO | Admitting: "Endocrinology

## 2019-01-22 ENCOUNTER — Telehealth: Payer: Self-pay | Admitting: Hematology and Oncology

## 2019-01-22 NOTE — Telephone Encounter (Signed)
Patient called and rescheduled 5/26 f/u to 6/18 due to mammo not until 6/3.

## 2019-01-22 NOTE — Telephone Encounter (Signed)
Called patient per 5/18 sch message - unable to reach patient - left message for patient to call back to reschedule.

## 2019-01-30 ENCOUNTER — Ambulatory Visit: Payer: PPO | Admitting: Hematology and Oncology

## 2019-02-07 ENCOUNTER — Ambulatory Visit
Admission: RE | Admit: 2019-02-07 | Discharge: 2019-02-07 | Disposition: A | Discharge status: Home / Self Care | Payer: Medicare HMO | Source: Ambulatory Visit | Attending: Adult Health | Admitting: Adult Health

## 2019-02-07 ENCOUNTER — Other Ambulatory Visit: Payer: Self-pay

## 2019-02-07 DIAGNOSIS — Z853 Personal history of malignant neoplasm of breast: Secondary | ICD-10-CM

## 2019-02-16 ENCOUNTER — Telehealth: Payer: Self-pay | Admitting: Hematology and Oncology

## 2019-02-16 NOTE — Assessment & Plan Note (Addendum)
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 She is taking calcium naturally and vitamin D supplement.  Left shoulder pain: Probably rotator cuff injury, patient is scheduled to get an MRI of the shoulder, seeing orthopedics. Breast cancer surveillance: Mammogram 02/07/2019: Breast density category B, benign  Return to clinic 1 year for follow-up

## 2019-02-16 NOTE — Telephone Encounter (Signed)
Spoke with patient re converting 6/18 f/u to [facility] visit.

## 2019-02-21 NOTE — Progress Notes (Signed)
HEMATOLOGY-ONCOLOGY DOXIMITY VISIT PROGRESS NOTE  I connected with [name] Black on 02/22/2019 at  2:45 PM EDT by Doximity video conference and verified that I am speaking with the correct person using two identifiers.  I discussed the limitations, risks, security and privacy concerns of performing an evaluation and management service by Doximity and the availability of in person appointments.  I also discussed with the patient that there may be a patient responsible charge related to this service. The patient expressed understanding and agreed to proceed.  Patient's Location: Home Physician Location: Clinic  CHIEF COMPLIANT: Follow-up on letrozole therapy  INTERVAL HISTORY: [name] Black is a 68 y.o. female with above-mentioned history of right breast cancer treated with lumpectomy, radiation, and is currently on letrozole therapy. Mammogram on 02/07/19 showed no evidence of malignancy bilaterally. She presents over Doximity for follow-up.  She continues to suffer from muscle aches and pains and stiffness.  It takes her significant amount of time to get up and move around so that she does not feel stiff.  Her shoulder injury is continuing to be a problem.  She was undergoing physical therapy but that that got stopped because of COVID-19.  She tells me that she pulled the biceps muscle and it is also sore.  She follows with orthopedics.  Oncology History  Malignant neoplasm of upper-outer quadrant of right breast in female, estrogen receptor positive ([place])  01/19/2017 Initial Diagnosis   Screening detected right breast mass 5 mm size axilla negative biopsy: Grade 2 invasive ductal carcinoma with DCIS ER 95%, PR 60%, HER-2 negative ratio 1.38, Ki-67 12%, T1aN0 stage IA clinical stage   02/17/2017 Surgery   Right lumpectomy: Invasive ductal carcinoma, grade 2, 0.7 cm, DCIS intermediate grade, margins negative, lymphovascular invasion present, additional lateral margin residual IDC grade 2 with  DCIS margins negative, 0/2 lymph nodes, T1b N0 stage IA ER 95%, PR 60%, HER-2 negative, Ki-67 12%   03/15/2017 Oncotype testing   Oncotype DX score 25, intermediate risk: 16% ROR with hormonal therapy alone   03/30/2017 - 04/26/2017 Radiation Therapy    Adjuvant radiation therapy   05/2017 -  Anti-estrogen oral therapy   Letrozole daily     REVIEW OF SYSTEMS:   Constitutional: Denies fevers, chills or abnormal weight loss Eyes: Denies blurriness of vision Ears, nose, mouth, throat, and face: Denies mucositis or sore throat Respiratory: Denies cough, dyspnea or wheezes Cardiovascular: Denies palpitation, chest discomfort Gastrointestinal:  Denies nausea, heartburn or change in bowel habits Skin: Denies abnormal skin rashes Lymphatics: Denies new lymphadenopathy or easy bruising Neurological:Denies numbness, tingling or new weaknesses Behavioral/Psych: Mood is stable, no new changes  Extremities: Shoulder pain and muscle pain  Breast: denies any pain or lumps or nodules in either breasts All other systems were reviewed with the patient and are negative.  Observations/Objective:  There were no vitals filed for this visit. There is no height or weight on file to calculate BMI.  I have reviewed the data as listed CMP Latest Ref Rng & Units 06/20/2018 01/26/2017 06/25/2014  Glucose 70 - 140 mg/dl - 77 87  BUN 4 - 21 8 13.6 13  Creatinine 0.5 - 1.1 0.8 0.9 0.83  Sodium 136 - 145 mEq/L - 142 140  Potassium 3.5 - 5.1 mEq/L - 4.8 4.9  Chloride 96 - 112 mEq/L - - 104  CO2 22 - 29 mEq/L - 31(H) 20  Calcium 8.4 - 10.4 mg/dL - 9.8 9.7  Total Protein 6.4 - 8.3 g/dL -  7.6 7.2  Total Bilirubin 0.20 - 1.20 mg/dL - 0.58 0.5  Alkaline Phos 40 - 150 U/L - 84 74  AST 5 - 34 U/L - 18 17  ALT 0 - 55 U/L - 29 23    Lab Results  Component Value Date   WBC 6.7 01/26/2017   HGB 13.7 01/26/2017   HCT 40.8 01/26/2017   MCV 93.3 01/26/2017   PLT 259 01/26/2017   NEUTROABS 4.4 01/26/2017       Assessment Plan:  Malignant neoplasm of upper-outer quadrant of right breast in female, estrogen receptor positive ([place]) 02/17/2017 Right lumpectomy: Invasive ductal carcinoma, grade 2, 0.7 cm, DCIS intermediate grade, margins negative, lymphovascular invasion present, additional lateral margin residual IDC grade 2 with DCIS margins negative, 0/2 lymph nodes, T1b N0 stage IA ER 95%, PR 60%, HER-2 negative, Ki-67 12%  Oncotype DX score 25 Adjuvant radiation 03/30/2017- 04/26/2017  Treatment plan: Adjuvant antiestrogen therapy withletrozole 2.5 mg dailystarted 05/08/2017 switched to anastrozole 02/22/2019  Letrozole toxicities: 1.Muscle pain and stiffness of her muscles which gets better with activity 2.Fatigue I discussed with her about switching her from letrozole to anastrozole.  She will stop letrozole for 2 weeks and then switched to anastrozole.  She will call us before the 30-day supply is up so that we can renew it for 90 days if she is tolerating it better than letrozole.  However if she likes letrozole better then we can send a 90-day supply with 3 refills on that.  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 She is taking calcium naturally and vitamin D supplement.  Left shoulder pain: Probably rotator cuff injury, PT, Biceps injury from lifting a large bag. 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  Breast cancer surveillance: Mammogram 02/07/2019: Breast density category B, benign  Return to clinic 1 year for follow-up  I discussed the assessment and treatment plan with the patient. The patient was provided an opportunity to ask questions and all were answered. The patient agreed with the plan and demonstrated an understanding of the instructions. The patient was advised to call back or seek an in-person evaluation if the symptoms worsen or if the condition fails to improve as anticipated.    I provided 15 minutes of face-to-face Doximity time during this encounter.    Rulon Eisenmenger, MD 02/22/2019   I, Molly Dorshimer, am acting as scribe for Nicholas Lose, MD.  I have reviewed the above documentation for accuracy and completeness, and I agree with the above.

## 2019-02-22 ENCOUNTER — Inpatient Hospital Stay: Payer: Medicare HMO | Attending: Hematology and Oncology | Admitting: Hematology and Oncology

## 2019-02-22 DIAGNOSIS — Z170 Estrogen receptor positive status [ER+]: Secondary | ICD-10-CM

## 2019-02-22 DIAGNOSIS — Z923 Personal history of irradiation: Secondary | ICD-10-CM | POA: Diagnosis not present

## 2019-02-22 DIAGNOSIS — C50411 Malignant neoplasm of upper-outer quadrant of right female breast: Secondary | ICD-10-CM

## 2019-02-22 DIAGNOSIS — Z79811 Long term (current) use of aromatase inhibitors: Secondary | ICD-10-CM

## 2019-02-22 MED ORDER — ANASTROZOLE 1 MG PO TABS: 1.0000 mg | ORAL_TABLET | Freq: Every day | ORAL | 0 refills | Status: DC

## 2019-03-07 ENCOUNTER — Other Ambulatory Visit: Payer: Self-pay

## 2019-03-07 DIAGNOSIS — E039 Hypothyroidism, unspecified: Secondary | ICD-10-CM

## 2019-03-07 DIAGNOSIS — R5383 Other fatigue: Secondary | ICD-10-CM

## 2019-03-07 MED ORDER — LEVOTHYROXINE SODIUM 125 MCG PO TABS: ORAL_TABLET | ORAL | 0 refills | Status: DC

## 2019-03-14 LAB — COMPREHENSIVE METABOLIC PANEL
ALT: 29 IU/L (ref 0–32)
AST: 23 IU/L (ref 0–40)
Albumin/Globulin Ratio: 2 (ref 1.2–2.2)
Albumin: 4.5 g/dL (ref 3.8–4.8)
Alkaline Phosphatase: 97 IU/L (ref 39–117)
BUN/Creatinine Ratio: 16 (ref 12–28)
BUN: 14 mg/dL (ref 8–27)
Bilirubin Total: 0.3 mg/dL (ref 0.0–1.2)
CO2: 24 mmol/L (ref 20–29)
Calcium: 9.5 mg/dL (ref 8.7–10.3)
Chloride: 100 mmol/L (ref 96–106)
Creatinine, Ser: 0.89 mg/dL (ref 0.57–1.00)
GFR calc Af Amer: 78 mL/min/{1.73_m2} (ref >59)
GFR calc non Af Amer: 68 mL/min/{1.73_m2} (ref >59)
Globulin, Total: 2.3 g/dL (ref 1.5–4.5)
Glucose: 98 mg/dL (ref 65–99)
Potassium: 5.3 mmol/L — ABNORMAL HIGH (ref 3.5–5.2)
Sodium: 139 mmol/L (ref 134–144)
Total Protein: 6.8 g/dL (ref 6.0–8.5)

## 2019-03-14 LAB — T4, FREE: Free T4: 1.57 ng/dL (ref 0.82–1.77)

## 2019-03-14 LAB — TSH: TSH: 0.655 u[IU]/mL (ref 0.450–4.500)

## 2019-03-14 LAB — VITAMIN D 25 HYDROXY (VIT D DEFICIENCY, FRACTURES): Vit D, 25-Hydroxy: 33.1 ng/mL (ref 30.0–100.0)

## 2019-03-18 ENCOUNTER — Other Ambulatory Visit: Payer: Self-pay | Admitting: Hematology and Oncology

## 2019-03-19 ENCOUNTER — Telehealth: Payer: Self-pay

## 2019-03-19 NOTE — Telephone Encounter (Signed)
TC to Pt. Before refilling prescription for anastrozole  Per Dr. Geralyn Flash note Pt would trial medication and give [place] a call if she would want to continue medication. Spoke with Pt. She stated she's only on her second week of taking the medication and would like [place] to give her a call on Monday 03/26/19. She stated she would let us know if she would continue with anastrozole.

## 2019-03-28 ENCOUNTER — Other Ambulatory Visit: Payer: Self-pay | Admitting: *Deleted

## 2019-03-28 MED ORDER — ANASTROZOLE 1 MG PO TABS: 1.0000 mg | ORAL_TABLET | Freq: Every day | ORAL | 0 refills | Status: DC

## 2019-03-28 NOTE — Telephone Encounter (Signed)
Received call from pt stating she has been taking anastrozole x2 weeks and is starting to experience the same symptoms she had on letrozole  (muscle pain and stiffness).  Per Dr. Lindi Adie, pt to take 1/2 tablet of anastrozole daily.  Pt verbalized understanding and will keep the office updated on her symptoms.

## 2019-04-10 ENCOUNTER — Other Ambulatory Visit: Payer: Self-pay

## 2019-04-10 ENCOUNTER — Ambulatory Visit (INDEPENDENT_AMBULATORY_CARE_PROVIDER_SITE_OTHER): Payer: Medicare HMO | Admitting: "Endocrinology

## 2019-04-10 ENCOUNTER — Encounter: Payer: Self-pay | Admitting: "Endocrinology

## 2019-04-10 DIAGNOSIS — E039 Hypothyroidism, unspecified: Secondary | ICD-10-CM | POA: Diagnosis not present

## 2019-04-10 DIAGNOSIS — E875 Hyperkalemia: Secondary | ICD-10-CM | POA: Insufficient documentation

## 2019-04-10 MED ORDER — LEVOTHYROXINE SODIUM 125 MCG PO TABS: ORAL_TABLET | ORAL | 1 refills | Status: DC

## 2019-04-10 NOTE — Progress Notes (Signed)
04/10/2019                                Endocrinology Telehealth Visit Follow up Note -During COVID -19 Pandemic  I connected with [name] Black on 04/10/2019   by telephone and verified that I am speaking with the correct person using two identifiers. [place], 09/25/1950. she has verbally consented to this visit. All issues noted in this document were discussed and addressed. The format was not optimal for physical exam.   Subjective:    Patient ID: [name] Black, female    DOB: 14-Jan-1951,    Past Medical History:  Diagnosis Date  . Cancer ([place]) 01/2017   right breast cancer  . GERD (gastroesophageal reflux disease)   . H/O seasonal allergies   . History of radiation therapy 03/30/17-04/28/17   right breast was treated to 42.72 Gy, right breast boost 10 Gy  . Hypothyroidism   . Personal history of radiation therapy    Past Surgical History:  Procedure Laterality Date  . BREAST LUMPECTOMY Right 02/17/2017  . COLONOSCOPY    . COLONOSCOPY N/A 01/09/2014   Procedure: COLONOSCOPY;  Surgeon: Rogene Houston, MD;  Location: [facility];  Service: Endoscopy;  Laterality: N/A;  1030  . Cyst removed from left knee    . Fibroid removed and cyst removed from ovary    . RADIOACTIVE SEED GUIDED PARTIAL MASTECTOMY WITH AXILLARY SENTINEL LYMPH NODE BIOPSY Right 02/17/2017   Procedure: RADIOACTIVE SEED GUIDED PARTIAL MASTECTOMY WITH AXILLARY SENTINEL LYMPH NODE BIOPSY;  Surgeon: Rolm Bookbinder, MD;  Location: [facility];  Service: General;  Laterality: Right;  . Right knee arthroscopy     Social History   Socioeconomic History  . Marital status: Married    Spouse name: Not on file  . Number of children: 1  . Years of education: Not on file  . Highest education level: Not on file  Occupational History  . Not on file  Social Needs  . Financial resource strain: Not on file  . Food insecurity    Worry: Not on file    Inability: Not on file  . Transportation  needs    Medical: Not on file    Non-medical: Not on file  Tobacco Use  . Smoking status: Former Smoker    Packs/day: 0.50    Years: 10.00    Pack years: 5.00    Types: Cigarettes    Quit date: 03/07/1982    Years since quitting: 37.1  . Smokeless tobacco: Never Used  Substance and Sexual Activity  . Alcohol use: Yes    Alcohol/week: 7.0 standard drinks    Types: 7 Glasses of wine per week    Comment: 1-2 glasses of wine or beer a night  . Drug use: No  . Sexual activity: Not on file  Lifestyle  . Physical activity    Days per week: Not on file    Minutes per session: Not on file  . Stress: Not on file  Relationships  . Social Herbalist on phone: Not on file    Gets together: Not on file    Attends religious service: Not on file    Active member of club or organization: Not on file    Attends meetings of clubs or organizations: Not on file    Relationship status: Not on file  Other Topics Concern  .  Not on file  Social History Narrative  . Not on file   Outpatient Encounter Medications as of 04/10/2019  Medication Sig  . anastrozole (ARIMIDEX) 1 MG tablet Take 1 tablet (1 mg total) by mouth daily. Take 1/2 tablet by mouth daily  . b complex vitamins tablet Take 1 tablet by mouth daily.  Marland Kitchen CALCIUM CARBONATE-VITAMIN D PO Take by mouth daily.   . diphenhydrAMINE (BENADRYL) 25 MG tablet Take 25 mg by mouth as needed.  . fexofenadine (ALLEGRA ALLERGY) 180 MG tablet Take 1 tablet (180 mg total) by mouth daily.  Nyoka Cowden Tea 150 MG CAPS Take by mouth.  Marland Kitchen ibuprofen (ADVIL,MOTRIN) 200 MG tablet Take 200 mg by mouth as needed.  Marland Kitchen letrozole (FEMARA) 2.5 MG tablet TAKE ONE (1) TABLET BY MOUTH EVERY DAY  . levothyroxine (SYNTHROID) 125 MCG tablet TAKE ONE (1) TABLET BY MOUTH EVERY DAY BEFORE BREAKFAST  . Misc Natural Products (BLACK CHERRY CONCENTRATE) LIQD Take 2 Doses/Fill by mouth daily.  . Multiple Vitamins-Minerals (MULTIVITAMINS THER. W/MINERALS) TABS tablet Take 1  tablet by mouth daily.  . Omega-3 Fatty Acids (FISH OIL OMEGA-3 PO) Take by mouth.  Marland Kitchen omeprazole (PRILOSEC) 20 MG capsule TAKE ONE (1) CAPSULE EACH DAY  . [DISCONTINUED] levothyroxine (SYNTHROID) 125 MCG tablet TAKE ONE (1) TABLET BY MOUTH EVERY DAY BEFORE BREAKFAST   No facility-administered encounter medications on file as of 04/10/2019.    ALLERGIES: No Known Allergies VACCINATION STATUS:  There is no immunization history on file for this patient.  HPI 68 year old female with long-standing primary hypothyroidism on levothyroxine 125 micrograms p.o. every morning before breakfast.  She reports compliance with medication.  She is being engaged in telehealth via telephone for follow-up with repeat thyroid function tests.  She denies any new complaints today.  -  She reports feeling better, stable weight, no palpitations, heat or cold intolerance.      Review of Systems  Limited as above.  Objective:    There were no vitals taken for this visit.  Wt Readings from Last 3 Encounters:  10/04/18 162 lb 12.8 oz (73.8 kg)  06/27/18 165 lb (74.8 kg)  03/28/18 165 lb (74.8 kg)    Physical Exam   Results for orders placed or performed in visit on 03/07/19  VITAMIN D 25 Hydroxy (Vit-D Deficiency, Fractures)  Result Value Ref Range   Vit D, 25-Hydroxy 33.1 30.0 - 100.0 ng/mL  Comprehensive metabolic panel  Result Value Ref Range   Glucose 98 65 - 99 mg/dL   BUN 14 8 - 27 mg/dL   Creatinine, Ser 0.89 0.57 - 1.00 mg/dL   GFR calc non Af Amer 68 >59 mL/min/1.73   GFR calc Af Amer 78 >59 mL/min/1.73   BUN/Creatinine Ratio 16 12 - 28   Sodium 139 134 - 144 mmol/L   Potassium 5.3 (H) 3.5 - 5.2 mmol/L   Chloride 100 96 - 106 mmol/L   CO2 24 20 - 29 mmol/L   Calcium 9.5 8.7 - 10.3 mg/dL   Total Protein 6.8 6.0 - 8.5 g/dL   Albumin 4.5 3.8 - 4.8 g/dL   Globulin, Total 2.3 1.5 - 4.5 g/dL   Albumin/Globulin Ratio 2.0 1.2 - 2.2   Bilirubin Total 0.3 0.0 - 1.2 mg/dL   Alkaline Phosphatase  97 39 - 117 IU/L   AST 23 0 - 40 IU/L   ALT 29 0 - 32 IU/L  T4, Free  Result Value Ref Range   Free T4 1.57 0.82 - 1.77  ng/dL  TSH  Result Value Ref Range   TSH 0.655 0.450 - 4.500 uIU/mL   Complete Blood Count (Most recent): Lab Results  Component Value Date   WBC 6.7 01/26/2017   HGB 13.7 01/26/2017   HCT 40.8 01/26/2017   MCV 93.3 01/26/2017   PLT 259 01/26/2017   Chemistry (most recent): Lab Results  Component Value Date   NA 139 03/13/2019   K 5.3 (H) 03/13/2019   CL 100 03/13/2019   CO2 24 03/13/2019   BUN 14 03/13/2019   CREATININE 0.89 03/13/2019    Recent Results (from the past 2160 hour(s))  VITAMIN D 25 Hydroxy (Vit-D Deficiency, Fractures)     Status: None   Collection Time: 03/13/19 11:18 AM  Result Value Ref Range   Vit D, 25-Hydroxy 33.1 30.0 - 100.0 ng/mL    Comment: Vitamin D deficiency has been defined by the [place] and an Endocrine Society practice guideline as a level of serum 25-OH vitamin D less than 20 ng/mL (1,2). The Endocrine Society went on to further define vitamin D insufficiency as a level between 21 and 29 ng/mL (2). 1. IOM (Institute of Medicine). 2010. Dietary reference    intakes for calcium and D. [place]: The    [employer]. 2. Holick MF, [place], Bischoff-Ferrari HA, et al.    Evaluation, treatment, and prevention of vitamin D    deficiency: an Endocrine Society clinical practice    guideline. JCEM. 2011 Jul; 96(7):1911-30.   Comprehensive metabolic panel     Status: Abnormal   Collection Time: 03/13/19 11:18 AM  Result Value Ref Range   Glucose 98 65 - 99 mg/dL   BUN 14 8 - 27 mg/dL   Creatinine, Ser 0.89 0.57 - 1.00 mg/dL   GFR calc non Af Amer 68 >59 mL/min/1.73   GFR calc Af Amer 78 >59 mL/min/1.73   BUN/Creatinine Ratio 16 12 - 28   Sodium 139 134 - 144 mmol/L   Potassium 5.3 (H) 3.5 - 5.2 mmol/L   Chloride 100 96 - 106 mmol/L   CO2 24 20 - 29 mmol/L   Calcium 9.5 8.7 - 10.3  mg/dL   Total Protein 6.8 6.0 - 8.5 g/dL   Albumin 4.5 3.8 - 4.8 g/dL   Globulin, Total 2.3 1.5 - 4.5 g/dL   Albumin/Globulin Ratio 2.0 1.2 - 2.2   Bilirubin Total 0.3 0.0 - 1.2 mg/dL   Alkaline Phosphatase 97 39 - 117 IU/L   AST 23 0 - 40 IU/L   ALT 29 0 - 32 IU/L  T4, Free     Status: None   Collection Time: 03/13/19 11:18 AM  Result Value Ref Range   Free T4 1.57 0.82 - 1.77 ng/dL  TSH     Status: None   Collection Time: 03/13/19 11:18 AM  Result Value Ref Range   TSH 0.655 0.450 - 4.500 uIU/mL    Assessment & Plan:   1. Hypothyroidism -Her previsit thyroid function tests are consistent with appropriate replacement with levothyroxine.   -She is advised to continue levothyroxine 125 mcg p.o. daily before breakfast.    - We discussed about the correct intake of her thyroid hormone, on empty stomach at fasting, with water, separated by at least 30 minutes from breakfast and other medications,  and separated by more than 4 hours from calcium, iron, multivitamins, acid reflux medications (PPIs). -Patient is made aware of the fact that thyroid hormone replacement is needed for life, dose  to be adjusted by periodic monitoring of thyroid function tests.   She had subcentimeter nodules will need follow-up ultrasound When she gets appropriate insurance coverage. -She was recently found to have osteopenia, currently on vitamin D supplements, 1000 units daily.  2.  History of prediabetes -Repeat A1c is 5.6%, stable from last year when it was 5.4%.  She does not need any medications for intervention.   - she  admits there is a room for improvement in her diet and drink choices. -  Suggestion is made for her to avoid simple carbohydrates  from her diet including Cakes, Sweet Desserts / Pastries, Ice Cream, Soda (diet and regular), Sweet Tea, Candies, Chips, Cookies, Sweet Pastries,  Store Bought Juices, Alcohol in Excess of  1-2 drinks a day, Artificial Sweeteners, Coffee Creamer, and  "Sugar-free" Products. This will help patient to have stable blood glucose profile and potentially avoid unintended weight gain.   3.  Dyslipidemia-LDL at 120, proving from 138 in 2018. -She wishes to avoid treatment with statins for now.  She will need repeat fasting lipid panel in 1/2 year.  4) hyperkalemia-mildly elevated potassium at 5.3.  He is advised to cut back on bananas, tomatoes, and will repeat CMP before her next visit.  She is not on potassium supplements.   Time for this visit: 15 minutes. [name] Black  participated in the discussions, expressed understanding, and voiced agreement with the above plans.  All questions were answered to her satisfaction. she is encouraged to contact clinic should she have any questions or concerns prior to her return visit.  Follow up plan: Return in about 6 months (around 10/11/2019), or Fax labs to Dr. Willey Blade, for Follow up with Pre-visit Labs.  Glade Lloyd, MD Phone: [phone]  Fax: [phone]  -  This note was partially dictated with voice recognition software. Similar sounding words can be transcribed inadequately or may not  be corrected upon review.  04/10/2019, 9:20 AM

## 2019-06-18 ENCOUNTER — Ambulatory Visit
Admission: RE | Admit: 2019-06-18 | Discharge: 2019-06-18 | Disposition: A | Discharge status: Home / Self Care | Payer: Medicare HMO | Source: Ambulatory Visit | Attending: Hematology and Oncology | Admitting: Hematology and Oncology

## 2019-06-18 ENCOUNTER — Other Ambulatory Visit: Payer: Self-pay

## 2019-06-18 DIAGNOSIS — M85859 Other specified disorders of bone density and structure, unspecified thigh: Secondary | ICD-10-CM

## 2019-06-20 ENCOUNTER — Telehealth: Payer: Self-pay | Admitting: Hematology and Oncology

## 2019-06-20 NOTE — Telephone Encounter (Signed)
R/s appt per 10/12 sch message- pt is aware of appt date and time

## 2019-06-29 ENCOUNTER — Telehealth: Payer: Self-pay

## 2019-06-29 NOTE — Telephone Encounter (Signed)
RN returned call.  Pt requesting copy of bone density scan results.   RN provided results, pt will call if any additional questions.

## 2019-07-12 ENCOUNTER — Other Ambulatory Visit: Payer: Self-pay | Admitting: Hematology and Oncology

## 2019-09-28 ENCOUNTER — Ambulatory Visit: Payer: Medicare HMO | Attending: Internal Medicine

## 2019-09-28 DIAGNOSIS — Z23 Encounter for immunization: Secondary | ICD-10-CM

## 2019-09-28 NOTE — Progress Notes (Signed)
   Covid-19 Vaccination Clinic  Name:  LEENAH KAPPELER    MRN: [id number] DOB: 04-23-1951  09/28/2019  Ms. Heldman was observed post Covid-19 immunization for 15 minutes without incidence. She was provided with Vaccine Information Sheet and instruction to access the V-Safe system.   Ms. Burrier was instructed to call 911 with any severe reactions post vaccine: Marland Kitchen Difficulty breathing  . Swelling of your face and throat  . A fast heartbeat  . A bad rash all over your body  . Dizziness and weakness    Immunizations Administered    Name Date Dose VIS Date Route   Pfizer COVID-19 Vaccine 09/28/2019  8:37 AM 0.3 mL 08/17/2019 Intramuscular   Manufacturer: [place]   Lot: [id number]   [place]: [id number]

## 2019-09-29 ENCOUNTER — Other Ambulatory Visit: Payer: Self-pay | Admitting: Hematology and Oncology

## 2019-10-02 ENCOUNTER — Ambulatory Visit: Payer: Medicare HMO | Admitting: Hematology and Oncology

## 2019-10-09 ENCOUNTER — Encounter: Payer: Self-pay | Admitting: Internal Medicine

## 2019-10-15 ENCOUNTER — Encounter: Payer: Self-pay | Admitting: "Endocrinology

## 2019-10-15 ENCOUNTER — Ambulatory Visit (INDEPENDENT_AMBULATORY_CARE_PROVIDER_SITE_OTHER): Payer: Medicare HMO | Admitting: "Endocrinology

## 2019-10-15 DIAGNOSIS — R739 Hyperglycemia, unspecified: Secondary | ICD-10-CM

## 2019-10-15 DIAGNOSIS — E039 Hypothyroidism, unspecified: Secondary | ICD-10-CM | POA: Diagnosis not present

## 2019-10-15 DIAGNOSIS — E782 Mixed hyperlipidemia: Secondary | ICD-10-CM | POA: Diagnosis not present

## 2019-10-15 MED ORDER — LEVOTHYROXINE SODIUM 125 MCG PO TABS: ORAL_TABLET | ORAL | 3 refills | Status: DC

## 2019-10-15 NOTE — Progress Notes (Signed)
10/15/2019                                                Endocrinology Telehealth Visit Follow up Note -During COVID -19 Pandemic  I connected with [name] Black on 10/15/2019   by telephone and verified that I am speaking with the correct person using two identifiers. [place], 1951-03-20. she has verbally consented to this visit. All issues noted in this document were discussed and addressed. The format was not optimal for physical exam.    Subjective:    Patient ID: [name] Black, female    DOB: [birth year],    Past Medical History:  Diagnosis Date  . Cancer ([place]) 01/2017   right breast cancer  . GERD (gastroesophageal reflux disease)   . H/O seasonal allergies   . History of radiation therapy 03/30/17-04/28/17   right breast was treated to 42.72 Gy, right breast boost 10 Gy  . Hypothyroidism   . Personal history of radiation therapy    Past Surgical History:  Procedure Laterality Date  . BREAST LUMPECTOMY Right 02/17/2017  . COLONOSCOPY    . COLONOSCOPY N/A 01/09/2014   Procedure: COLONOSCOPY;  Surgeon: Rogene Houston, MD;  Location: [facility];  Service: Endoscopy;  Laterality: N/A;  1030  . Cyst removed from left knee    . Fibroid removed and cyst removed from ovary    . RADIOACTIVE SEED GUIDED PARTIAL MASTECTOMY WITH AXILLARY SENTINEL LYMPH NODE BIOPSY Right 02/17/2017   Procedure: RADIOACTIVE SEED GUIDED PARTIAL MASTECTOMY WITH AXILLARY SENTINEL LYMPH NODE BIOPSY;  Surgeon: Rolm Bookbinder, MD;  Location: [facility];  Service: General;  Laterality: Right;  . Right knee arthroscopy     Social History   Socioeconomic History  . Marital status: Married    Spouse name: Not on file  . Number of children: 1  . Years of education: Not on file  . Highest education level: Not on file  Occupational History  . Not on file  Tobacco Use  . Smoking status: Former Smoker    Packs/day: 0.50    Years: 10.00    Pack years: 5.00    Types: Cigarettes     Quit date: 03/07/1982    Years since quitting: 37.6  . Smokeless tobacco: Never Used  Substance and Sexual Activity  . Alcohol use: Yes    Alcohol/week: 7.0 standard drinks    Types: 7 Glasses of wine per week    Comment: 1-2 glasses of wine or beer a night  . Drug use: No  . Sexual activity: Not on file  Other Topics Concern  . Not on file  Social History Narrative  . Not on file   Social Determinants of Health   Financial Resource Strain:   . Difficulty of Paying Living Expenses: Not on file  Food Insecurity:   . Worried About Charity fundraiser in the Last Year: Not on file  . Ran Out of Food in the Last Year: Not on file  Transportation Needs:   . Lack of Transportation (Medical): Not on file  . Lack of Transportation (Non-Medical): Not on file  Physical Activity:   . Days of Exercise per Week: Not on file  . Minutes of Exercise per Session: Not on file  Stress:   . Feeling of Stress : Not on file  Social Connections:   . Frequency of Communication with Friends and Family: Not on file  . Frequency of Social Gatherings with Friends and Family: Not on file  . Attends Religious Services: Not on file  . Active Member of Clubs or Organizations: Not on file  . Attends Archivist Meetings: Not on file  . Marital Status: Not on file   Outpatient Encounter Medications as of 10/15/2019  Medication Sig  . anastrozole (ARIMIDEX) 1 MG tablet TAKE 1 TABLET BY MOUTH EVERY DAY  . b complex vitamins tablet Take 1 tablet by mouth daily.  Marland Kitchen CALCIUM CARBONATE-VITAMIN D PO Take by mouth daily.   . diphenhydrAMINE (BENADRYL) 25 MG tablet Take 25 mg by mouth as needed.  . fexofenadine (ALLEGRA ALLERGY) 180 MG tablet Take 1 tablet (180 mg total) by mouth daily.  Nyoka Cowden Tea 150 MG CAPS Take by mouth.  Marland Kitchen ibuprofen (ADVIL,MOTRIN) 200 MG tablet Take 200 mg by mouth as needed.  Marland Kitchen letrozole (FEMARA) 2.5 MG tablet TAKE 1 TABLET BY MOUTH EVERY DAY  . levothyroxine (SYNTHROID) 125 MCG  tablet TAKE ONE (1) TABLET BY MOUTH EVERY DAY BEFORE BREAKFAST  . Misc Natural Products (BLACK CHERRY CONCENTRATE) LIQD Take 2 Doses/Fill by mouth daily.  . Multiple Vitamins-Minerals (MULTIVITAMINS THER. W/MINERALS) TABS tablet Take 1 tablet by mouth daily.  . Omega-3 Fatty Acids (FISH OIL OMEGA-3 PO) Take by mouth.  Marland Kitchen omeprazole (PRILOSEC) 20 MG capsule TAKE ONE (1) CAPSULE EACH DAY  . [DISCONTINUED] levothyroxine (SYNTHROID) 125 MCG tablet TAKE ONE (1) TABLET BY MOUTH EVERY DAY BEFORE BREAKFAST   No facility-administered encounter medications on file as of 10/15/2019.   ALLERGIES: No Known Allergies VACCINATION STATUS: Immunization History  Administered Date(s) Administered  . PFIZER SARS-COV-2 Vaccination 09/28/2019    HPI 69 year old female with long-standing primary hypothyroidism on levothyroxine 125 micrograms p.o. every morning before breakfast.  She reports compliance with medication.  She is being engaged in telehealth via telephone for follow-up of hypothyroidism, with repeat labs.  She has no new complaints today.    -She continues to feel better.  She denies palpitations, heat intolerance, tremors.  -Her A1c remains stable at 5.6%.     Review of Systems  Limited as above.  Objective:    There were no vitals taken for this visit.  Wt Readings from Last 3 Encounters:  10/04/18 162 lb 12.8 oz (73.8 kg)  06/27/18 165 lb (74.8 kg)  03/28/18 165 lb (74.8 kg)    Physical Exam   Results for orders placed or performed in visit on 03/07/19  VITAMIN D 25 Hydroxy (Vit-D Deficiency, Fractures)  Result Value Ref Range   Vit D, 25-Hydroxy 33.1 30.0 - 100.0 ng/mL  Comprehensive metabolic panel  Result Value Ref Range   Glucose 98 65 - 99 mg/dL   BUN 14 8 - 27 mg/dL   Creatinine, Ser 0.89 0.57 - 1.00 mg/dL   GFR calc non Af Amer 68 >59 mL/min/1.73   GFR calc Af Amer 78 >59 mL/min/1.73   BUN/Creatinine Ratio 16 12 - 28   Sodium 139 134 - 144 mmol/L   Potassium 5.3 (H) 3.5  - 5.2 mmol/L   Chloride 100 96 - 106 mmol/L   CO2 24 20 - 29 mmol/L   Calcium 9.5 8.7 - 10.3 mg/dL   Total Protein 6.8 6.0 - 8.5 g/dL   Albumin 4.5 3.8 - 4.8 g/dL   Globulin, Total 2.3 1.5 - 4.5 g/dL   Albumin/Globulin Ratio 2.0 1.2 -  2.2   Bilirubin Total 0.3 0.0 - 1.2 mg/dL   Alkaline Phosphatase 97 39 - 117 IU/L   AST 23 0 - 40 IU/L   ALT 29 0 - 32 IU/L  T4, Free  Result Value Ref Range   Free T4 1.57 0.82 - 1.77 ng/dL  TSH  Result Value Ref Range   TSH 0.655 0.450 - 4.500 uIU/mL   Complete Blood Count (Most recent): Lab Results  Component Value Date   WBC 6.7 01/26/2017   HGB 13.7 01/26/2017   HCT 40.8 01/26/2017   MCV 93.3 01/26/2017   PLT 259 01/26/2017   Chemistry (most recent): Lab Results  Component Value Date   NA 139 03/13/2019   K 5.3 (H) 03/13/2019   CL 100 03/13/2019   CO2 24 03/13/2019   BUN 14 03/13/2019   CREATININE 0.89 03/13/2019   Lipid Panel     Component Value Date/Time   CHOL 201 (A) 06/20/2018 0000   TRIG 108 06/20/2018 0000   HDL 59 06/20/2018 0000   LDLCALC 120 06/20/2018 0000     Assessment & Plan:   1. Hypothyroidism -Her previsit thyroid function tests are consistent with appropriate replacement.  Her low TSH is slightly suppressed, she will continue to benefit from current dose of levothyroxine. -She is advised to continue levothyroxine 125 mcg p.o. daily before breakfast.    - We discussed about the correct intake of her thyroid hormone, on empty stomach at fasting, with water, separated by at least 30 minutes from breakfast and other medications,  and separated by more than 4 hours from calcium, iron, multivitamins, acid reflux medications (PPIs). -Patient is made aware of the fact that thyroid hormone replacement is needed for life, dose to be adjusted by periodic monitoring of thyroid function tests.  She had subcentimeter nodules will need follow-up ultrasound When she gets appropriate insurance coverage. -She was recently  found to have osteopenia, currently on vitamin D supplements, 1000 units daily.  2.  History of prediabetes -Repeat A1c remains the same at 5.6%, stable from last year when it was 5.4%.  She does not need any medications for intervention.   - she  admits there is a room for improvement in her diet and drink choices. -  Suggestion is made for her to avoid simple carbohydrates  from her diet including Cakes, Sweet Desserts / Pastries, Ice Cream, Soda (diet and regular), Sweet Tea, Candies, Chips, Cookies, Sweet Pastries,  Store Bought Juices, Alcohol in Excess of  1-2 drinks a day, Artificial Sweeteners, Coffee Creamer, and "Sugar-free" Products. This will help patient to have stable blood glucose profile and potentially avoid unintended weight gain.  3.  Dyslipidemia-LDL at 120, proving from 138 in 2018. -She wishes to avoid treatment with statins for now.  She will be considered for fasting lipid panel before her next visit.       - Time spent on this patient care encounter:  25 minutes of which 50% was spent in  counseling and the rest reviewing  her current and  previous labs / studies and medications  doses and developing a plan for long term care. [name] Black  participated in the discussions, expressed understanding, and voiced agreement with the above plans.  All questions were answered to her satisfaction. she is encouraged to contact clinic should she have any questions or concerns prior to her return visit.   Follow up plan: Return in about 1 year (around 10/14/2020) for Follow up with Pre-visit Labs.  Glade Lloyd, MD Phone: [phone]  Fax: [phone]  -  This note was partially dictated with voice recognition software. Similar sounding words can be transcribed inadequately or may not  be corrected upon review.  10/15/2019, 12:12 PM

## 2019-10-18 ENCOUNTER — Ambulatory Visit: Payer: Medicare HMO

## 2019-10-18 ENCOUNTER — Ambulatory Visit: Payer: Medicare HMO | Attending: Internal Medicine

## 2019-10-18 DIAGNOSIS — Z23 Encounter for immunization: Secondary | ICD-10-CM

## 2019-10-18 NOTE — Progress Notes (Signed)
   Covid-19 Vaccination Clinic  Name:  BOYCE MURDOUGH    MRN: [id number] DOB: 02-Nov-1950  10/18/2019  Ms. Harvill was observed post Covid-19 immunization for 15 minutes without incidence. She was provided with Vaccine Information Sheet and instruction to access the V-Safe system.   Ms. Ditmore was instructed to call 911 with any severe reactions post vaccine: Marland Kitchen Difficulty breathing  . Swelling of your face and throat  . A fast heartbeat  . A bad rash all over your body  . Dizziness and weakness    Immunizations Administered    Name Date Dose VIS Date Route   Pfizer COVID-19 Vaccine 10/18/2019 12:47 PM 0.3 mL 08/17/2019 Intramuscular   Manufacturer: [place]   Lot: [id number]   [place]: [id number]

## 2019-10-19 ENCOUNTER — Ambulatory Visit: Payer: Medicare HMO

## 2019-12-30 NOTE — Progress Notes (Signed)
 Patient Care Team: Fagan, Roy, MD as PCP - General (Internal Medicine) Taavon, Richard, MD as Consulting Physician (Obstetrics and Gynecology) Wakefield, Matthew, MD as Consulting Physician (General Surgery) Gudena, Vinay, MD as Consulting Physician (Hematology and Oncology) Kinard, James, MD as Consulting Physician (Radiation Oncology) Causey, Lindsey Cornetto, NP as Nurse Practitioner (Hematology and Oncology)  DIAGNOSIS:    ICD-10-CM   1. Malignant neoplasm of upper-outer quadrant of right breast in female, estrogen receptor positive (HCC)  C50.411    Z17.0     SUMMARY OF ONCOLOGIC HISTORY: Oncology History  Malignant neoplasm of upper-outer quadrant of right breast in female, estrogen receptor positive (HCC)  01/19/2017 Initial Diagnosis   Screening detected right breast mass 5 mm size axilla negative biopsy: Grade 2 invasive ductal carcinoma with DCIS ER 95%, PR 60%, HER-2 negative ratio 1.38, Ki-67 12%, T1aN0 stage IA clinical stage   02/17/2017 Surgery   Right lumpectomy: Invasive ductal carcinoma, grade 2, 0.7 cm, DCIS intermediate grade, margins negative, lymphovascular invasion present, additional lateral margin residual IDC grade 2 with DCIS margins negative, 0/2 lymph nodes, T1b N0 stage IA ER 95%, PR 60%, HER-2 negative, Ki-67 12%   03/15/2017 Oncotype testing   Oncotype DX score 25, intermediate risk: 16% ROR with hormonal therapy alone   03/30/2017 - 04/26/2017 Radiation Therapy    Adjuvant radiation therapy   05/2017 -  Anti-estrogen oral therapy   Letrozole daily     CHIEF COMPLIANT: Follow-up of right breast cancer on anastrozole therapy  INTERVAL HISTORY: [name] Black is a 68 y.o. with above-mentioned history of right breast cancer treated with lumpectomy,radiation, andis currently on letrozole therapy. Bone density scan on 06/18/19 showed osteopenia with a T-score of -1.8 at the right femur neck. She presents to the clinic today for follow-up.   Denies any  lumps or nodules in the breast.  Currently on half a tablet of anastrozole daily.  She is able to tolerate this better.   ALLERGIES:  has No Known Allergies.  MEDICATIONS:  Current Outpatient Medications  Medication Sig Dispense Refill  . anastrozole (ARIMIDEX) 1 MG tablet TAKE 1 TABLET BY MOUTH EVERY DAY 90 tablet 2  . b complex vitamins tablet Take 1 tablet by mouth daily.    . CALCIUM CARBONATE-VITAMIN D PO Take by mouth daily.     . diphenhydrAMINE (BENADRYL) 25 MG tablet Take 25 mg by mouth as needed.    . fexofenadine (ALLEGRA ALLERGY) 180 MG tablet Take 1 tablet (180 mg total) by mouth daily.    . Green Tea 150 MG CAPS Take by mouth.    . ibuprofen (ADVIL,MOTRIN) 200 MG tablet Take 200 mg by mouth as needed.    . letrozole (FEMARA) 2.5 MG tablet TAKE 1 TABLET BY MOUTH EVERY DAY 90 tablet 2  . levothyroxine (SYNTHROID) 125 MCG tablet TAKE ONE (1) TABLET BY MOUTH EVERY DAY BEFORE BREAKFAST 90 tablet 3  . Misc Natural Products (BLACK CHERRY CONCENTRATE) LIQD Take 2 Doses/Fill by mouth daily.    . Multiple Vitamins-Minerals (MULTIVITAMINS THER. W/MINERALS) TABS tablet Take 1 tablet by mouth daily.    . Omega-3 Fatty Acids (FISH OIL OMEGA-3 PO) Take by mouth.    . omeprazole (PRILOSEC) 20 MG capsule TAKE ONE (1) CAPSULE EACH DAY 30 capsule 5   No current facility-administered medications for this visit.    PHYSICAL EXAMINATION: ECOG PERFORMANCE STATUS: 1 - Symptomatic but completely ambulatory  Vitals:   12/31/19 1409  BP: (!) 157/97  Pulse: (!) 111    Resp: 17  Temp: 98.9 F (37.2 C)  SpO2: 98%   Filed Weights   12/31/19 1409  Weight: 163 lb 6.4 oz (74.1 kg)    BREAST: No palpable masses or nodules in either right or left breasts. No palpable axillary supraclavicular or infraclavicular adenopathy no breast tenderness or nipple discharge. (exam performed in the presence of a chaperone)  LABORATORY DATA:  I have reviewed the data as listed CMP Latest Ref Rng & Units  03/13/2019 06/20/2018 01/26/2017  Glucose 65 - 99 mg/dL 98 - 77  BUN 8 - 27 mg/dL 14 8 13.6  Creatinine 0.57 - 1.00 mg/dL 0.89 0.8 0.9  Sodium 134 - 144 mmol/L 139 - 142  Potassium 3.5 - 5.2 mmol/L 5.3(H) - 4.8  Chloride 96 - 106 mmol/L 100 - -  CO2 20 - 29 mmol/L 24 - 31(H)  Calcium 8.7 - 10.3 mg/dL 9.5 - 9.8  Total Protein 6.0 - 8.5 g/dL 6.8 - 7.6  Total Bilirubin 0.0 - 1.2 mg/dL 0.3 - 0.58  Alkaline Phos 39 - 117 IU/L 97 - 84  AST 0 - 40 IU/L 23 - 18  ALT 0 - 32 IU/L 29 - 29    Lab Results  Component Value Date   WBC 6.7 01/26/2017   HGB 13.7 01/26/2017   HCT 40.8 01/26/2017   MCV 93.3 01/26/2017   PLT 259 01/26/2017   NEUTROABS 4.4 01/26/2017    ASSESSMENT & PLAN:  Malignant neoplasm of upper-outer quadrant of right breast in female, estrogen receptor positive (HCC) 02/17/2017 Right lumpectomy: Invasive ductal carcinoma, grade 2, 0.7 cm, DCIS intermediate grade, margins negative, lymphovascular invasion present, additional lateral margin residual IDC grade 2 with DCIS margins negative, 0/2 lymph nodes, T1b N0 stage IA ER 95%, PR 60%, HER-2 negative, Ki-67 12%  Oncotype DX score 25 Adjuvant radiation 03/30/2017- 04/26/2017  Treatment plan: Adjuvant antiestrogen therapy withletrozole 2.5 mg dailystarted 05/08/2017 switched to anastrozole 02/22/2019 currently on half a tablet of anastrozole. Anastrozole toxicities: Doing much better with regards to hot flashes and arthralgias and myalgias.  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  Left shoulder pain: Frozen shoulder.  Much better with physical therapy.  One of the biceps tendons snapped.  She is trying hard to strengthen her arm.  Breast cancer surveillance:  Mammogram 02/07/2019: Breast density category B, benign Breast exam 12/31/2019: Benign  Hypertension: I discussed with her to follow with her primary care physician  for her blood pressure issues. Return to clinic 1 year for follow-up    No orders of the defined types were placed in this encounter.  The patient has a good understanding of the overall plan. she agrees with it. she will call with any problems that may develop before the next visit here.  Total time spent: 20 mins including face to face time and time spent for planning, charting and coordination of care  Gudena, Vinay, MD 12/31/2019  I, Molly Dorshimer, am acting as scribe for Dr. Vinay Gudena.  I have reviewed the above documentation for accuracy and completeness, and I agree with the above.       

## 2019-12-31 ENCOUNTER — Inpatient Hospital Stay: Payer: Medicare HMO | Attending: Hematology and Oncology | Admitting: Hematology and Oncology

## 2019-12-31 ENCOUNTER — Other Ambulatory Visit: Payer: Self-pay

## 2019-12-31 DIAGNOSIS — M85851 Other specified disorders of bone density and structure, right thigh: Secondary | ICD-10-CM | POA: Diagnosis not present

## 2019-12-31 DIAGNOSIS — Z923 Personal history of irradiation: Secondary | ICD-10-CM | POA: Insufficient documentation

## 2019-12-31 DIAGNOSIS — C50411 Malignant neoplasm of upper-outer quadrant of right female breast: Secondary | ICD-10-CM | POA: Insufficient documentation

## 2019-12-31 DIAGNOSIS — Z170 Estrogen receptor positive status [ER+]: Secondary | ICD-10-CM | POA: Diagnosis not present

## 2019-12-31 DIAGNOSIS — I10 Essential (primary) hypertension: Secondary | ICD-10-CM | POA: Insufficient documentation

## 2019-12-31 DIAGNOSIS — Z79899 Other long term (current) drug therapy: Secondary | ICD-10-CM | POA: Diagnosis not present

## 2019-12-31 DIAGNOSIS — Z79811 Long term (current) use of aromatase inhibitors: Secondary | ICD-10-CM | POA: Diagnosis not present

## 2019-12-31 NOTE — Assessment & Plan Note (Signed)
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 She is taking calcium naturally and vitamin D supplement.  Left shoulder pain: Probably rotator cuff injury  Breast cancer surveillance:  Mammogram 02/07/2019: Breast density category B, benign Breast exam 12/31/2019: Benign  Return to clinic 1 year for follow-up

## 2020-01-01 ENCOUNTER — Other Ambulatory Visit: Payer: Self-pay | Admitting: Adult Health

## 2020-01-01 ENCOUNTER — Other Ambulatory Visit: Payer: Self-pay | Admitting: Hematology and Oncology

## 2020-01-01 ENCOUNTER — Telehealth: Payer: Self-pay | Admitting: Hematology and Oncology

## 2020-01-01 DIAGNOSIS — Z853 Personal history of malignant neoplasm of breast: Secondary | ICD-10-CM

## 2020-01-01 NOTE — Telephone Encounter (Signed)
Scheduled per 04/26 los, patient has been called and voicemail was left. 

## 2020-02-08 ENCOUNTER — Other Ambulatory Visit: Payer: Self-pay | Admitting: Hematology and Oncology

## 2020-02-08 ENCOUNTER — Ambulatory Visit
Admission: RE | Admit: 2020-02-08 | Discharge: 2020-02-08 | Disposition: A | Discharge status: Home / Self Care | Payer: Medicare HMO | Source: Ambulatory Visit | Attending: Hematology and Oncology | Admitting: Hematology and Oncology

## 2020-02-08 ENCOUNTER — Other Ambulatory Visit: Payer: Self-pay

## 2020-02-08 DIAGNOSIS — R921 Mammographic calcification found on diagnostic imaging of breast: Secondary | ICD-10-CM

## 2020-02-08 DIAGNOSIS — Z853 Personal history of malignant neoplasm of breast: Secondary | ICD-10-CM

## 2020-05-15 ENCOUNTER — Telehealth: Payer: Self-pay | Admitting: *Deleted

## 2020-05-15 ENCOUNTER — Other Ambulatory Visit: Payer: Self-pay | Admitting: *Deleted

## 2020-05-15 DIAGNOSIS — Z170 Estrogen receptor positive status [ER+]: Secondary | ICD-10-CM

## 2020-05-15 NOTE — Telephone Encounter (Signed)
[place] ordered then follow up with Dr Lindi Adie. Pt notified

## 2020-05-15 NOTE — Telephone Encounter (Signed)
Pt states she has been having pain in her right arm pit X 1 week. States it is where she had her lymph nodes removed in 2018. She is having right shoulder pain as well. Please advise

## 2020-05-16 ENCOUNTER — Telehealth: Payer: Self-pay | Admitting: Hematology and Oncology

## 2020-05-16 ENCOUNTER — Other Ambulatory Visit: Payer: Self-pay | Admitting: Hematology and Oncology

## 2020-05-16 DIAGNOSIS — Z170 Estrogen receptor positive status [ER+]: Secondary | ICD-10-CM

## 2020-05-16 NOTE — Telephone Encounter (Signed)
Scheduled appt per 9/10 sch msg - left message for patient with apt date and time

## 2020-05-28 ENCOUNTER — Ambulatory Visit
Admission: RE | Admit: 2020-05-28 | Discharge: 2020-05-28 | Disposition: A | Discharge status: Home / Self Care | Payer: Medicare HMO | Source: Ambulatory Visit | Attending: Hematology and Oncology | Admitting: Hematology and Oncology

## 2020-05-28 ENCOUNTER — Other Ambulatory Visit: Payer: Self-pay | Admitting: Hematology and Oncology

## 2020-05-28 ENCOUNTER — Other Ambulatory Visit: Payer: Self-pay

## 2020-05-28 DIAGNOSIS — Z170 Estrogen receptor positive status [ER+]: Secondary | ICD-10-CM

## 2020-05-28 DIAGNOSIS — M7989 Other specified soft tissue disorders: Secondary | ICD-10-CM

## 2020-05-28 NOTE — Progress Notes (Signed)
Patient Care Team: Asencion Noble, MD as PCP - General (Internal Medicine) Brien Few, MD as Consulting Physician (Obstetrics and Gynecology) Rolm Bookbinder, MD as Consulting Physician (General Surgery) Nicholas Lose, MD as Consulting Physician (Hematology and Oncology) Gery Pray, MD as Consulting Physician (Radiation Oncology) Gardenia Phlegm, NP as Nurse Practitioner (Hematology and Oncology)  DIAGNOSIS:    ICD-10-CM   1. Malignant neoplasm of upper-outer quadrant of right breast in female, estrogen receptor positive ([place])  C50.411    Z17.0     SUMMARY OF ONCOLOGIC HISTORY: Oncology History  Malignant neoplasm of upper-outer quadrant of right breast in female, estrogen receptor positive ([place])  01/19/2017 Initial Diagnosis   Screening detected right breast mass 5 mm size axilla negative biopsy: Grade 2 invasive ductal carcinoma with DCIS ER 95%, PR 60%, HER-2 negative ratio 1.38, Ki-67 12%, T1aN0 stage IA clinical stage   02/17/2017 Surgery   Right lumpectomy: Invasive ductal carcinoma, grade 2, 0.7 cm, DCIS intermediate grade, margins negative, lymphovascular invasion present, additional lateral margin residual IDC grade 2 with DCIS margins negative, 0/2 lymph nodes, T1b N0 stage IA ER 95%, PR 60%, HER-2 negative, Ki-67 12%   03/15/2017 Oncotype testing   Oncotype DX score 25, intermediate risk: 16% ROR with hormonal therapy alone   03/30/2017 - 04/26/2017 Radiation Therapy    Adjuvant radiation therapy   05/2017 -  Anti-estrogen oral therapy   Letrozole daily     CHIEF COMPLIANT: Follow-up of right breast cancer on letrozole therapy  INTERVAL HISTORY: [name] Black is a 69 y.o. with above-mentioned history of right breast cancer treated with lumpectomy,radiation, andis currently on letrozole therapy. Mammogram on 02/08/20 showed a 0.3cm group of likely benign calcifications in the upper outer right breast and no evidence of malignancy bilaterally. She  presentsto the clinic today for follow-up because she noticed right axillary swelling pain and discomfort. She underwent ultrasound of the axilla which did not show any evidence of lymphadenopathy.  ALLERGIES:  has No Known Allergies.  MEDICATIONS:  Current Outpatient Medications  Medication Sig Dispense Refill  . anastrozole (ARIMIDEX) 1 MG tablet TAKE 1 TABLET BY MOUTH EVERY DAY 90 tablet 2  . b complex vitamins tablet Take 1 tablet by mouth daily.    Marland Kitchen CALCIUM CARBONATE-VITAMIN D PO Take by mouth daily.     . diphenhydrAMINE (BENADRYL) 25 MG tablet Take 25 mg by mouth as needed.    . fexofenadine (ALLEGRA ALLERGY) 180 MG tablet Take 1 tablet (180 mg total) by mouth daily.    Nyoka Cowden Tea 150 MG CAPS Take by mouth.    Marland Kitchen ibuprofen (ADVIL,MOTRIN) 200 MG tablet Take 200 mg by mouth as needed.    Marland Kitchen letrozole (FEMARA) 2.5 MG tablet TAKE 1 TABLET BY MOUTH EVERY DAY 90 tablet 2  . levothyroxine (SYNTHROID) 125 MCG tablet TAKE ONE (1) TABLET BY MOUTH EVERY DAY BEFORE BREAKFAST 90 tablet 3  . Misc Natural Products (BLACK CHERRY CONCENTRATE) LIQD Take 2 Doses/Fill by mouth daily.    . Multiple Vitamins-Minerals (MULTIVITAMINS THER. W/MINERALS) TABS tablet Take 1 tablet by mouth daily.    . Omega-3 Fatty Acids (FISH OIL OMEGA-3 PO) Take by mouth.    Marland Kitchen omeprazole (PRILOSEC) 20 MG capsule TAKE ONE (1) CAPSULE EACH DAY 30 capsule 5   No current facility-administered medications for this visit.    PHYSICAL EXAMINATION: ECOG PERFORMANCE STATUS: 1 - Symptomatic but completely ambulatory  Vitals:   05/29/20 0918  BP: (!) 150/83  Pulse: 88  Resp:  18  Temp: (!) 97.3 F (36.3 C)  SpO2: 98%   Filed Weights   05/29/20 0918  Weight: 158 lb 14.4 oz (72.1 kg)    BREAST: No palpable masses or nodules in either right or left breasts. No palpable axillary supraclavicular or infraclavicular adenopathy no breast tenderness or nipple discharge. (exam performed in the presence of a chaperone)  LABORATORY  DATA:  I have reviewed the data as listed CMP Latest Ref Rng & Units 03/13/2019 06/20/2018 01/26/2017  Glucose 65 - 99 mg/dL 98 - 77  BUN 8 - 27 mg/dL 14 8 13.6  Creatinine 0.57 - 1.00 mg/dL 0.89 0.8 0.9  Sodium 134 - 144 mmol/L 139 - 142  Potassium 3.5 - 5.2 mmol/L 5.3(H) - 4.8  Chloride 96 - 106 mmol/L 100 - -  CO2 20 - 29 mmol/L 24 - 31(H)  Calcium 8.7 - 10.3 mg/dL 9.5 - 9.8  Total Protein 6.0 - 8.5 g/dL 6.8 - 7.6  Total Bilirubin 0.0 - 1.2 mg/dL 0.3 - 0.58  Alkaline Phos 39 - 117 IU/L 97 - 84  AST 0 - 40 IU/L 23 - 18  ALT 0 - 32 IU/L 29 - 29    Lab Results  Component Value Date   WBC 6.7 01/26/2017   HGB 13.7 01/26/2017   HCT 40.8 01/26/2017   MCV 93.3 01/26/2017   PLT 259 01/26/2017   NEUTROABS 4.4 01/26/2017    ASSESSMENT & PLAN:  Malignant neoplasm of upper-outer quadrant of right breast in female, estrogen receptor positive ([place]) 02/17/2017 Right lumpectomy: Invasive ductal carcinoma, grade 2, 0.7 cm, DCIS intermediate grade, margins negative, lymphovascular invasion present, additional lateral margin residual IDC grade 2 with DCIS margins negative, 0/2 lymph nodes, T1b N0 stage IA ER 95%, PR 60%, HER-2 negative, Ki-67 12%  Oncotype DX score 25 Adjuvant radiation 03/30/2017- 04/26/2017  Treatment plan: Adjuvant antiestrogen therapy withletrozole 2.5 mg dailystarted 9/2/2018switched to anastrozole 02/22/2019 currently on half a tablet of anastrozole. Anastrozole toxicities: Doing much better with regards to hot flashes and arthralgias and myalgias.  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  Left shoulder pain: Frozen shoulder.  Much better with physical therapy.  One of the biceps tendons snapped.  She is trying hard to strengthen her arm. Right axillary discomfort: Could be a swollen muscle or cording. It got better with stretching and exercises. Recent  ultrasound of the axilla was negative for lymphadenopathy or metastatic disease.  Breast cancer surveillance:  Mammogram 02/08/2020: 3 mm group of benign calcifications UOQ right breast.  54-monthfollow-up right mammogram recommended. This will be done December 2021.    Hypertension: Today's blood pressure is 150 x 83. Return to clinic as previously scheduled in April 2022 for follow-up    No orders of the defined types were placed in this encounter.  The patient has a good understanding of the overall plan. she agrees with it. she will call with any problems that may develop before the next visit here.  Total time spent: 20 mins including face to face time and time spent for planning, charting and coordination of care  GNicholas Lose MD 05/29/2020  I, MCloyde ReamsDorshimer, am acting as scribe for Dr. VNicholas Lose  I have reviewed the above documentation for accuracy and completeness, and I agree with the above.

## 2020-05-29 ENCOUNTER — Inpatient Hospital Stay: Payer: Medicare HMO | Attending: Hematology and Oncology | Admitting: Hematology and Oncology

## 2020-05-29 ENCOUNTER — Other Ambulatory Visit: Payer: Self-pay

## 2020-05-29 DIAGNOSIS — Z170 Estrogen receptor positive status [ER+]: Secondary | ICD-10-CM | POA: Diagnosis not present

## 2020-05-29 DIAGNOSIS — I10 Essential (primary) hypertension: Secondary | ICD-10-CM | POA: Diagnosis not present

## 2020-05-29 DIAGNOSIS — Z79811 Long term (current) use of aromatase inhibitors: Secondary | ICD-10-CM | POA: Insufficient documentation

## 2020-05-29 DIAGNOSIS — C50411 Malignant neoplasm of upper-outer quadrant of right female breast: Secondary | ICD-10-CM | POA: Diagnosis present

## 2020-05-29 NOTE — Assessment & Plan Note (Signed)
02/17/2017 Right lumpectomy: Invasive ductal carcinoma, grade 2, 0.7 cm, DCIS intermediate grade, margins negative, lymphovascular invasion present, additional lateral margin residual IDC grade 2 with DCIS margins negative, 0/2 lymph nodes, T1b N0 stage IA ER 95%, PR 60%, HER-2 negative, Ki-67 12%  Oncotype DX score 25 Adjuvant radiation 03/30/2017- 04/26/2017  Treatment plan: Adjuvant antiestrogen therapy withletrozole 2.5 mg dailystarted 9/2/2018switched to anastrozole 02/22/2019 currently on half a tablet of anastrozole. Anastrozole toxicities: Doing much better with regards to hot flashes and arthralgias and myalgias.  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  Left shoulder pain: Frozen shoulder.  Much better with physical therapy.  One of the biceps tendons snapped.  She is trying hard to strengthen her arm.  Breast cancer surveillance:  Mammogram 02/08/2020: 3 mm group of benign calcifications UOQ right breast.  9-monthfollow-up right mammogram recommended.  This will be done December 2021. Breast exam 05/29/2020: Benign  Hypertension: I discussed with her to follow with her primary care physician for her blood pressure issues. Return to clinic 1 year for follow-up

## 2020-05-30 ENCOUNTER — Telehealth: Payer: Self-pay | Admitting: Hematology and Oncology

## 2020-05-30 NOTE — Telephone Encounter (Signed)
No 9/23 los, no changes made to pt schedule

## 2020-08-11 ENCOUNTER — Ambulatory Visit
Admission: RE | Admit: 2020-08-11 | Discharge: 2020-08-11 | Disposition: A | Discharge status: Home / Self Care | Payer: Medicare HMO | Source: Ambulatory Visit | Attending: Hematology and Oncology | Admitting: Hematology and Oncology

## 2020-08-11 ENCOUNTER — Other Ambulatory Visit: Payer: Self-pay | Admitting: Hematology and Oncology

## 2020-08-11 ENCOUNTER — Other Ambulatory Visit: Payer: Self-pay

## 2020-08-11 DIAGNOSIS — Z853 Personal history of malignant neoplasm of breast: Secondary | ICD-10-CM

## 2020-08-11 DIAGNOSIS — R921 Mammographic calcification found on diagnostic imaging of breast: Secondary | ICD-10-CM

## 2020-09-29 ENCOUNTER — Other Ambulatory Visit: Payer: Self-pay

## 2020-09-29 DIAGNOSIS — E039 Hypothyroidism, unspecified: Secondary | ICD-10-CM

## 2020-10-03 ENCOUNTER — Encounter: Payer: Self-pay | Admitting: *Deleted

## 2020-10-03 NOTE — Progress Notes (Signed)
Received call from pt stating she was recently diagnosed with vaginal atrophy by Dr. Ronita Hipps with [facility].  Pt states Dr. Ronita Hipps is wanting to prescribe pt a local Estradiol vaginal cream to help alleviate symptoms.  Reviewed with MD and per MD okay to proceed with treatment under the supervision of OBGYN. Pt verbalized understanding and will proceed with tx.

## 2020-10-11 LAB — TSH: TSH: 1.15 u[IU]/mL (ref 0.450–4.500)

## 2020-10-11 LAB — T4, FREE: Free T4: 1.65 ng/dL (ref 0.82–1.77)

## 2020-10-11 LAB — VITAMIN D 25 HYDROXY (VIT D DEFICIENCY, FRACTURES): Vit D, 25-Hydroxy: 42 ng/mL (ref 30.0–100.0)

## 2020-10-14 ENCOUNTER — Ambulatory Visit: Payer: Medicare HMO | Admitting: "Endocrinology

## 2020-10-14 ENCOUNTER — Other Ambulatory Visit: Payer: Self-pay

## 2020-10-14 ENCOUNTER — Encounter: Payer: Self-pay | Admitting: "Endocrinology

## 2020-10-14 VITALS — BP 126/78 | HR 84 | Ht 66.0 in | Wt 161.0 lb

## 2020-10-14 DIAGNOSIS — R7303 Prediabetes: Secondary | ICD-10-CM

## 2020-10-14 DIAGNOSIS — E039 Hypothyroidism, unspecified: Secondary | ICD-10-CM

## 2020-10-14 DIAGNOSIS — Z5320 Procedure and treatment not carried out because of patient's decision for unspecified reasons: Secondary | ICD-10-CM | POA: Diagnosis not present

## 2020-10-14 DIAGNOSIS — E782 Mixed hyperlipidemia: Secondary | ICD-10-CM

## 2020-10-14 LAB — POCT GLYCOSYLATED HEMOGLOBIN (HGB A1C): Hemoglobin A1C: 5.6 % (ref 4.0–5.6)

## 2020-10-14 MED ORDER — LEVOTHYROXINE SODIUM 125 MCG PO TABS: ORAL_TABLET | ORAL | 3 refills | Status: DC

## 2020-10-14 NOTE — Progress Notes (Signed)
10/14/2020              Endocrinology follow-up note   Subjective:    Patient ID: [name] Black, female    DOB: [birth year],    Past Medical History:  Diagnosis Date  . Cancer ([place]) 01/2017   right breast cancer  . GERD (gastroesophageal reflux disease)   . H/O seasonal allergies   . History of radiation therapy 03/30/17-04/28/17   right breast was treated to 42.72 Gy, right breast boost 10 Gy  . Hypothyroidism   . Personal history of radiation therapy    Past Surgical History:  Procedure Laterality Date  . BREAST BIOPSY Right 01/2017  . BREAST LUMPECTOMY Right 02/17/2017  . COLONOSCOPY    . COLONOSCOPY N/A 01/09/2014   Procedure: COLONOSCOPY;  Surgeon: Rogene Houston, MD;  Location: [facility];  Service: Endoscopy;  Laterality: N/A;  1030  . Cyst removed from left knee    . Fibroid removed and cyst removed from ovary    . RADIOACTIVE SEED GUIDED PARTIAL MASTECTOMY WITH AXILLARY SENTINEL LYMPH NODE BIOPSY Right 02/17/2017   Procedure: RADIOACTIVE SEED GUIDED PARTIAL MASTECTOMY WITH AXILLARY SENTINEL LYMPH NODE BIOPSY;  Surgeon: Rolm Bookbinder, MD;  Location: [facility];  Service: General;  Laterality: Right;  . Right knee arthroscopy     Social History   Socioeconomic History  . Marital status: Married    Spouse name: Not on file  . Number of children: 1  . Years of education: Not on file  . Highest education level: Not on file  Occupational History  . Not on file  Tobacco Use  . Smoking status: Former Smoker    Packs/day: 0.50    Years: 10.00    Pack years: 5.00    Types: Cigarettes    Quit date: 03/07/1982    Years since quitting: 38.6  . Smokeless tobacco: Never Used  Vaping Use  . Vaping Use: Never used  Substance and Sexual Activity  . Alcohol use: Yes    Alcohol/week: 7.0 standard drinks    Types: 7 Glasses of wine per week    Comment: 1-2 glasses of wine or beer a night  . Drug use: No  . Sexual activity: Not on file  Other Topics  Concern  . Not on file  Social History Narrative  . Not on file   Social Determinants of Health   Financial Resource Strain: Not on file  Food Insecurity: Not on file  Transportation Needs: Not on file  Physical Activity: Not on file  Stress: Not on file  Social Connections: Not on file   Outpatient Encounter Medications as of 10/14/2020  Medication Sig  . Calcium Citrate-Vitamin D (CITRACAL + D PO) Take 2 tablets by mouth daily in the afternoon.  Marland Kitchen anastrozole (ARIMIDEX) 1 MG tablet TAKE 1 TABLET BY MOUTH EVERY DAY  . b complex vitamins tablet Take 1 tablet by mouth daily.  . diphenhydrAMINE (BENADRYL) 25 MG tablet Take 25 mg by mouth as needed.  . fluticasone (FLONASE) 50 MCG/ACT nasal spray Place 1 spray into both nostrils as needed for allergies or rhinitis (as needed seasonally).  Nyoka Cowden Tea 150 MG CAPS Take by mouth.  Marland Kitchen ibuprofen (ADVIL,MOTRIN) 200 MG tablet Take 200 mg by mouth as needed.  Marland Kitchen letrozole (FEMARA) 2.5 MG tablet TAKE 1 TABLET BY MOUTH EVERY DAY  . levothyroxine (SYNTHROID) 125 MCG tablet TAKE ONE (1) TABLET BY MOUTH EVERY DAY BEFORE BREAKFAST  . Misc Natural  Products (BLACK CHERRY CONCENTRATE) LIQD Take 2 Doses/Fill by mouth daily.  . Multiple Vitamins-Minerals (MULTIVITAMINS THER. W/MINERALS) TABS tablet Take 1 tablet by mouth daily.  . Omega-3 Fatty Acids (FISH OIL OMEGA-3 PO) Take by mouth.  Marland Kitchen omeprazole (PRILOSEC) 20 MG capsule TAKE ONE (1) CAPSULE EACH DAY  . Turmeric (QC TUMERIC COMPLEX PO) Take by mouth.  Marland Kitchen VITAMIN D, CHOLECALCIFEROL, PO Take by mouth.  . [DISCONTINUED] Calcium Citrate (CITRACAL PO) Take 1,200 mg by mouth daily.  . [DISCONTINUED] levothyroxine (SYNTHROID) 125 MCG tablet TAKE ONE (1) TABLET BY MOUTH EVERY DAY BEFORE BREAKFAST   No facility-administered encounter medications on file as of 10/14/2020.   ALLERGIES: No Known Allergies VACCINATION STATUS: Immunization History  Administered Date(s) Administered  . PFIZER(Purple Top)SARS-COV-2  Vaccination 09/28/2019, 10/18/2019    HPI 70 year old female patient with  long-standing primary hypothyroidism on levothyroxine 125 micrograms p.o. every morning before breakfast.  She reports compliance with her medication.  She is returning for follow-up with repeat thyroid function test.  She has no new complaints today.  Her labs are consistent with appropriate replacement.  She also has history of prediabetes.  She denies polydipsia, polyuria.   She has more consistent energy level.She continues to feel better.  She denies palpitations, heat intolerance, tremors.  -Her A1c remains stable at 5.6% at point-of-care today.       Review of Systems  Limited as above.  Objective:    BP 126/78   Pulse 84   Ht 5\' 6"  (1.676 m)   Wt 161 lb (73 kg)   BMI 25.99 kg/m   Wt Readings from Last 3 Encounters:  10/14/20 161 lb (73 kg)  05/29/20 158 lb 14.4 oz (72.1 kg)  12/31/19 163 lb 6.4 oz (74.1 kg)    Physical Exam   Results for orders placed or performed in visit on 10/14/20  HgB A1c  Result Value Ref Range   Hemoglobin A1C 5.6 4.0 - 5.6 %   HbA1c POC (<> result, manual entry)     HbA1c, POC (prediabetic range)     HbA1c, POC (controlled diabetic range)     Complete Blood Count (Most recent): Lab Results  Component Value Date   WBC 6.7 01/26/2017   HGB 13.7 01/26/2017   HCT 40.8 01/26/2017   MCV 93.3 01/26/2017   PLT 259 01/26/2017   Chemistry (most recent): Lab Results  Component Value Date   NA 139 03/13/2019   K 5.3 (H) 03/13/2019   CL 100 03/13/2019   CO2 24 03/13/2019   BUN 14 03/13/2019   CREATININE 0.89 03/13/2019   Lipid Panel     Component Value Date/Time   CHOL 201 (A) 06/20/2018 0000   TRIG 108 06/20/2018 0000   HDL 59 06/20/2018 0000   LDLCALC 120 06/20/2018 0000     Assessment & Plan:   1. Hypothyroidism -Her previsit thyroid function tests are consistent with appropriate replacement.  She is advised to continue levothyroxine 125 mcg p.o. daily  before breakfast.    - We discussed about the correct intake of her thyroid hormone, on empty stomach at fasting, with water, separated by at least 30 minutes from breakfast and other medications,  and separated by more than 4 hours from calcium, iron, multivitamins, acid reflux medications (PPIs). -Patient is made aware of the fact that thyroid hormone replacement is needed for life, dose to be adjusted by periodic monitoring of thyroid function tests.  She had subcentimeter nodules will need follow-up ultrasound When she gets  appropriate insurance coverage. -She was recently found to have osteopenia, currently on vitamin D supplements, 1000 units daily.  2.  History of prediabetes -Point-of-care A1c today remains stable at 5.6%.  She will not need medication transition at this time.    - she acknowledges that there is a room for improvement in her food and drink choices. - Suggestion is made for her to avoid simple carbohydrates  from her diet including Cakes, Sweet Desserts, Ice Cream, Soda (diet and regular), Sweet Tea, Candies, Chips, Cookies, Store Bought Juices, Alcohol in Excess of  1-2 drinks a day, Artificial Sweeteners,  Coffee Creamer, and "Sugar-free" Products, Lemonade. This will help patient to have more stable blood glucose profile and potentially avoid unintended weight gain.  3.  Dyslipidemia-last measured LDL was 120.  -She wishes to avoid treatment with statins for now.  She will be considered for fasting lipid panel before her next visit.   She is advised to continue follow-up with her PMD.     - Time spent on this patient care encounter:  20 minutes of which 50% was spent in  counseling and the rest reviewing  her current and  previous labs / studies and medications  doses and developing a plan for long term care. Sion C Wemhoff  participated in the discussions, expressed understanding, and voiced agreement with the above plans.  All questions were answered to her  satisfaction. she is encouraged to contact clinic should she have any questions or concerns prior to her return visit.   Follow up plan: Return in about 1 year (around 10/14/2021) for A1c -NV, F/U with Pre-visit Labs.  Glade Lloyd, MD Phone: [phone]  Fax: [phone]  -  This note was partially dictated with voice recognition software. Similar sounding words can be transcribed inadequately or may not  be corrected upon review.  10/14/2020, 10:44 AM

## 2020-10-15 ENCOUNTER — Ambulatory Visit (INDEPENDENT_AMBULATORY_CARE_PROVIDER_SITE_OTHER): Payer: Medicare HMO

## 2020-10-15 ENCOUNTER — Ambulatory Visit: Payer: Medicare HMO | Admitting: Podiatry

## 2020-10-15 ENCOUNTER — Other Ambulatory Visit: Payer: Self-pay

## 2020-10-15 ENCOUNTER — Encounter: Payer: Self-pay | Admitting: Podiatry

## 2020-10-15 DIAGNOSIS — M79671 Pain in right foot: Secondary | ICD-10-CM | POA: Diagnosis not present

## 2020-10-15 DIAGNOSIS — M79672 Pain in left foot: Secondary | ICD-10-CM | POA: Diagnosis not present

## 2020-10-15 DIAGNOSIS — M722 Plantar fascial fibromatosis: Secondary | ICD-10-CM

## 2020-10-15 MED ORDER — TRIAMCINOLONE ACETONIDE 10 MG/ML IJ SUSP
10.0000 mg | Freq: Once | INTRAMUSCULAR | Status: AC
Start: 2020-10-15 — End: 2020-10-15
  Administered 2020-10-15: 10 mg

## 2020-10-15 NOTE — Patient Instructions (Signed)

## 2020-10-16 NOTE — Progress Notes (Signed)
Subjective:   Patient ID: [name] Black, female   DOB: 70 y.o.   MRN: [id number]   HPI Patient presents stating she is had a lot of pain in the bottom of her left heel and she is getting ready to go to the [facility] in about 5 weeks and needs to have this better.  States it is bad when she gets up in the morning after sitting and she cannot do the type of walking she will need to do for her trip.  Patient does not smoke likes to be active   Review of Systems  All other systems reviewed and are negative.       Objective:  Physical Exam Vitals and nursing note reviewed.  Constitutional:      Appearance: She is well-developed and well-nourished.  Cardiovascular:     Pulses: Intact distal pulses.  Pulmonary:     Effort: Pulmonary effort is normal.  Musculoskeletal:        General: Normal range of motion.  Skin:    General: Skin is warm.  Neurological:     Mental Status: She is alert.     Neurovascular status intact muscle strength was found to be adequate range of motion adequate.  Patient is found to have exquisite discomfort plantar aspect left heel more in the lateral side of the heel with inflammation fluid buildup noted.  Patient is found to have good digital perfusion well oriented x3     Assessment:  Acute plantar fasciitis left inflammation fluid of the plantar lateral heel     Plan:  H&P reviewed condition and went ahead and discussed aggressive treatment due to her compressed schedule.  Today I did sterile prep injected from the lateral direction 3 mg Kenalog 5 mg Xylocaine applied fascial brace instructed on anti-inflammatories and the possibility for immobilization if it is not getting better  X-rays are negative for signs of fracture no indications of stress fracture or other bone pathology

## 2020-10-22 ENCOUNTER — Ambulatory Visit: Payer: Medicare HMO | Admitting: Podiatry

## 2020-10-22 ENCOUNTER — Encounter: Payer: Self-pay | Admitting: Podiatry

## 2020-10-22 ENCOUNTER — Other Ambulatory Visit: Payer: Self-pay

## 2020-10-22 DIAGNOSIS — M722 Plantar fascial fibromatosis: Secondary | ICD-10-CM | POA: Diagnosis not present

## 2020-10-23 NOTE — Progress Notes (Signed)
Subjective:   Patient ID: [name] Black, female   DOB: 70 y.o.   MRN: [id number]   HPI Patient states the pain has improved in her heel but still is quite significant and it is 4 weeks from her going to the [facility].  States that she is still concerned about weightbearing and has not been able to resume activity   ROS      Objective:  Physical Exam  Neurovascular status intact with discomfort in the plantar lateral aspect of the left heel inflamed still with pain upon palpation to the area     Assessment:  Acute plantar fasciitis plantar left lateral side at insertion calcaneus     Plan:  H&P reviewed condition and with her short window here before she leaves I did go ahead and applied air fracture walker for complete immobilization of the area.  I explained immobilization and I went ahead also I did sterile prep and I injected 1 more time into the plantar fascial lateral side 3 mg Dexasone Kenalog 5 mg Xylocaine.  Patient will be seen back 3 weeks to reevaluate and in the last week I do want her to test this with her activity levels signed this

## 2020-11-10 ENCOUNTER — Other Ambulatory Visit: Payer: Self-pay

## 2020-11-10 ENCOUNTER — Encounter: Payer: Self-pay | Admitting: Podiatry

## 2020-11-10 ENCOUNTER — Ambulatory Visit: Payer: Medicare HMO | Admitting: Podiatry

## 2020-11-10 DIAGNOSIS — M722 Plantar fascial fibromatosis: Secondary | ICD-10-CM

## 2020-11-14 NOTE — Progress Notes (Signed)
Subjective:   Patient ID: [name] Black, female   DOB: 70 y.o.   MRN: [id number]   HPI Patient presents stating that her pain has significantly lessened and she is very happy as she is getting ready to go on her trip to the [facility]   ROS      Objective:  Physical Exam  Neurovascular status intact with significant diminishment of posterior plantar pain left with reduction of the discomfort with palpation     Assessment:  Improvement in plantar fascial inflammation left with pain that has receded to a significant degree     Plan:  Reviewed condition recommended that we continue conservative and that she use her boot as needed she utilize inserts supportive shoe gear topical medicine and she will be seen back depending on symptomatology

## 2020-12-04 ENCOUNTER — Other Ambulatory Visit: Payer: Self-pay | Admitting: Hematology and Oncology

## 2020-12-29 NOTE — Progress Notes (Signed)
Patient Care Team: Asencion Noble, MD as PCP - General (Internal Medicine) Brien Few, MD as Consulting Physician (Obstetrics and Gynecology) Rolm Bookbinder, MD as Consulting Physician (General Surgery) Nicholas Lose, MD as Consulting Physician (Hematology and Oncology) Gery Pray, MD as Consulting Physician (Radiation Oncology) Gardenia Phlegm, NP as Nurse Practitioner (Hematology and Oncology)  DIAGNOSIS:    ICD-10-CM   1. Malignant neoplasm of upper-outer quadrant of right breast in female, estrogen receptor positive ([place])  C50.411    Z17.0     SUMMARY OF ONCOLOGIC HISTORY: Oncology History  Malignant neoplasm of upper-outer quadrant of right breast in female, estrogen receptor positive ([place])  01/19/2017 Initial Diagnosis   Screening detected right breast mass 5 mm size axilla negative biopsy: Grade 2 invasive ductal carcinoma with DCIS ER 95%, PR 60%, HER-2 negative ratio 1.38, Ki-67 12%, T1aN0 stage IA clinical stage   02/17/2017 Surgery   Right lumpectomy: Invasive ductal carcinoma, grade 2, 0.7 cm, DCIS intermediate grade, margins negative, lymphovascular invasion present, additional lateral margin residual IDC grade 2 with DCIS margins negative, 0/2 lymph nodes, T1b N0 stage IA ER 95%, PR 60%, HER-2 negative, Ki-67 12%   03/15/2017 Oncotype testing   Oncotype DX score 25, intermediate risk: 16% ROR with hormonal therapy alone   03/30/2017 - 04/26/2017 Radiation Therapy    Adjuvant radiation therapy   05/2017 -  Anti-estrogen oral therapy   Letrozole daily     CHIEF COMPLIANT: Follow-upof right breast canceronletrozoletherapy  INTERVAL HISTORY: [name] Black is a 70 y.o. with above-mentioned history of right breast cancer treated with lumpectomy,radiation, andis currently on letrozole therapy. Mammogram on 08/11/20 showed probably benign right breast calcifications. She presentsto the clinic todayfor follow-up   ALLERGIES:  has No Known  Allergies.  MEDICATIONS:  Current Outpatient Medications  Medication Sig Dispense Refill  . anastrozole (ARIMIDEX) 1 MG tablet TAKE 1 TABLET BY MOUTH EVERY DAY 90 tablet 0  . b complex vitamins tablet Take 1 tablet by mouth daily.    . Calcium Citrate-Vitamin D (CITRACAL + D PO) Take 2 tablets by mouth daily in the afternoon.    . diphenhydrAMINE (BENADRYL) 25 MG tablet Take 25 mg by mouth as needed.    . fluticasone (FLONASE) 50 MCG/ACT nasal spray Place 1 spray into both nostrils as needed for allergies or rhinitis (as needed seasonally).    Nyoka Cowden Tea 150 MG CAPS Take by mouth.    Marland Kitchen ibuprofen (ADVIL) 400 MG tablet Take 400 mg by mouth every 4 (four) hours as needed.    Marland Kitchen ibuprofen (ADVIL,MOTRIN) 200 MG tablet Take 200 mg by mouth as needed.    Marland Kitchen levothyroxine (SYNTHROID) 125 MCG tablet TAKE ONE (1) TABLET BY MOUTH EVERY DAY BEFORE BREAKFAST 90 tablet 3  . Misc Natural Products (BLACK CHERRY CONCENTRATE) LIQD Take 2 Doses/Fill by mouth daily.    . Multiple Vitamins-Minerals (MULTIVITAMINS THER. W/MINERALS) TABS tablet Take 1 tablet by mouth daily.    . Omega-3 Fatty Acids (FISH OIL OMEGA-3 PO) Take by mouth.    Marland Kitchen omeprazole (PRILOSEC) 20 MG capsule TAKE ONE (1) CAPSULE EACH DAY 30 capsule 5  . Turmeric (QC TUMERIC COMPLEX PO) Take by mouth.    Marland Kitchen VITAMIN D, CHOLECALCIFEROL, PO Take by mouth.     No current facility-administered medications for this visit.    PHYSICAL EXAMINATION: ECOG PERFORMANCE STATUS: 1 - Symptomatic but completely ambulatory  Vitals:   12/30/20 1042  BP: 140/82  Pulse: 81  Resp: 20  Temp:  98.8 F (37.1 C)  SpO2: 95%   Filed Weights   12/30/20 1042  Weight: 161 lb 11.2 oz (73.3 kg)    BREAST: No palpable masses or nodules in either right or left breasts. No palpable axillary supraclavicular or infraclavicular adenopathy no breast tenderness or nipple discharge. (exam performed in the presence of a chaperone)  LABORATORY DATA:  I have reviewed the data  as listed CMP Latest Ref Rng & Units 03/13/2019 06/20/2018 01/26/2017  Glucose 65 - 99 mg/dL 98 - 77  BUN 8 - 27 mg/dL 14 8 13.6  Creatinine 0.57 - 1.00 mg/dL 0.89 0.8 0.9  Sodium 134 - 144 mmol/L 139 - 142  Potassium 3.5 - 5.2 mmol/L 5.3(H) - 4.8  Chloride 96 - 106 mmol/L 100 - -  CO2 20 - 29 mmol/L 24 - 31(H)  Calcium 8.7 - 10.3 mg/dL 9.5 - 9.8  Total Protein 6.0 - 8.5 g/dL 6.8 - 7.6  Total Bilirubin 0.0 - 1.2 mg/dL 0.3 - 0.58  Alkaline Phos 39 - 117 IU/L 97 - 84  AST 0 - 40 IU/L 23 - 18  ALT 0 - 32 IU/L 29 - 29    Lab Results  Component Value Date   WBC 6.7 01/26/2017   HGB 13.7 01/26/2017   HCT 40.8 01/26/2017   MCV 93.3 01/26/2017   PLT 259 01/26/2017   NEUTROABS 4.4 01/26/2017    ASSESSMENT & PLAN:  Malignant neoplasm of upper-outer quadrant of right breast in female, estrogen receptor positive ([place]) 02/17/2017 Right lumpectomy: Invasive ductal carcinoma, grade 2, 0.7 cm, DCIS intermediate grade, margins negative, lymphovascular invasion present, additional lateral margin residual IDC grade 2 with DCIS margins negative, 0/2 lymph nodes, T1b N0 stage IA ER 95%, PR 60%, HER-2 negative, Ki-67 12%  Oncotype DX score 25 Adjuvant radiation 03/30/2017- 04/26/2017  Treatment plan: Adjuvant antiestrogen therapy withletrozole 2.5 mg dailystarted 9/2/2018switched to anastrozole 6/18/2020currently on half a tablet of anastrozole. Anastrozole toxicities:Doing much better with regards to hot flashes and arthralgias and myalgias.  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  Left shoulder pain:Frozen shoulder. Much better with physical therapy.  Breast cancer surveillance:  Mammogram scheduled for 02/10/2021.  Breast exam 12/30/2020: No palpable lumps or nodules of concern I ordered a bone density done October 2022.  Return to clinic  in 1 year for follow-up in July  2023    No orders of the defined types were placed in this encounter.  The patient has a good understanding of the overall plan. she agrees with it. she will call with any problems that may develop before the next visit here.  Total time spent: 20 mins including face to face time and time spent for planning, charting and coordination of care  Rulon Eisenmenger, MD, MPH 12/30/2020  I, Molly Dorshimer, am acting as scribe for Dr. Nicholas Lose.  I have reviewed the above documentation for accuracy and completeness, and I agree with the above.

## 2020-12-30 ENCOUNTER — Inpatient Hospital Stay: Payer: Medicare HMO | Attending: Hematology and Oncology | Admitting: Hematology and Oncology

## 2020-12-30 ENCOUNTER — Other Ambulatory Visit: Payer: Self-pay

## 2020-12-30 VITALS — BP 140/82 | HR 81 | Temp 98.8°F | Resp 20 | Ht 66.0 in | Wt 161.7 lb

## 2020-12-30 DIAGNOSIS — Z79811 Long term (current) use of aromatase inhibitors: Secondary | ICD-10-CM | POA: Insufficient documentation

## 2020-12-30 DIAGNOSIS — C50411 Malignant neoplasm of upper-outer quadrant of right female breast: Secondary | ICD-10-CM | POA: Diagnosis not present

## 2020-12-30 DIAGNOSIS — Z170 Estrogen receptor positive status [ER+]: Secondary | ICD-10-CM | POA: Diagnosis not present

## 2020-12-30 DIAGNOSIS — Z780 Asymptomatic menopausal state: Secondary | ICD-10-CM | POA: Diagnosis not present

## 2020-12-30 DIAGNOSIS — M8580 Other specified disorders of bone density and structure, unspecified site: Secondary | ICD-10-CM

## 2020-12-30 DIAGNOSIS — Z923 Personal history of irradiation: Secondary | ICD-10-CM | POA: Diagnosis not present

## 2020-12-30 MED ORDER — ESTRADIOL 0.5 MG/0.5GM TD GEL: 1.0000 | TRANSDERMAL | Status: DC

## 2020-12-30 MED ORDER — ANASTROZOLE 1 MG PO TABS: 0.5000 mg | ORAL_TABLET | Freq: Every day | ORAL | 0 refills | Status: DC

## 2020-12-30 MED ORDER — IBUPROFEN 200 MG PO TABS: 200.0000 mg | ORAL_TABLET | ORAL | Status: DC | PRN

## 2020-12-30 NOTE — Assessment & Plan Note (Signed)
02/17/2017 Right lumpectomy: Invasive ductal carcinoma, grade 2, 0.7 cm, DCIS intermediate grade, margins negative, lymphovascular invasion present, additional lateral margin residual IDC grade 2 with DCIS margins negative, 0/2 lymph nodes, T1b N0 stage IA ER 95%, PR 60%, HER-2 negative, Ki-67 12%  Oncotype DX score 25 Adjuvant radiation 03/30/2017- 04/26/2017  Treatment plan: Adjuvant antiestrogen therapy withletrozole 2.5 mg dailystarted 9/2/2018switched to anastrozole 6/18/2020currently on half a tablet of anastrozole. Anastrozole toxicities:Doing much better with regards to hot flashes and arthralgias and myalgias.  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  Left shoulder pain:Frozen shoulder. Much better with physical therapy.  Breast cancer surveillance:  Mammogram scheduled for 02/10/2021.  Breast exam 12/30/2020:  Hypertension: Today's blood pressure is 150 x 83. Return to clinic  in 1 year for follow-up

## 2021-01-12 ENCOUNTER — Other Ambulatory Visit (HOSPITAL_COMMUNITY): Payer: Self-pay | Admitting: Internal Medicine

## 2021-01-12 ENCOUNTER — Encounter (HOSPITAL_COMMUNITY): Payer: Self-pay | Admitting: Radiology

## 2021-01-12 ENCOUNTER — Other Ambulatory Visit: Payer: Self-pay | Admitting: Internal Medicine

## 2021-01-12 ENCOUNTER — Ambulatory Visit (HOSPITAL_COMMUNITY)
Admission: RE | Admit: 2021-01-12 | Discharge: 2021-01-12 | Disposition: A | Discharge status: Home / Self Care | Payer: Medicare HMO | Source: Ambulatory Visit | Attending: Internal Medicine | Admitting: Internal Medicine

## 2021-01-12 ENCOUNTER — Other Ambulatory Visit: Payer: Self-pay

## 2021-01-12 DIAGNOSIS — R109 Unspecified abdominal pain: Secondary | ICD-10-CM | POA: Insufficient documentation

## 2021-01-12 DIAGNOSIS — R319 Hematuria, unspecified: Secondary | ICD-10-CM | POA: Diagnosis present

## 2021-01-12 LAB — POCT I-STAT CREATININE: Creatinine, Ser: 0.8 mg/dL (ref 0.44–1.00)

## 2021-01-12 MED ORDER — IOHEXOL 300 MG/ML  SOLN
125.0000 mL | Freq: Once | INTRAMUSCULAR | Status: AC | PRN
  Administered 2021-01-12: 125 mL via INTRAVENOUS

## 2021-02-06 ENCOUNTER — Other Ambulatory Visit: Payer: Self-pay | Admitting: Hematology and Oncology

## 2021-02-10 ENCOUNTER — Other Ambulatory Visit: Payer: Self-pay

## 2021-02-10 ENCOUNTER — Ambulatory Visit
Admission: RE | Admit: 2021-02-10 | Discharge: 2021-02-10 | Disposition: A | Discharge status: Home / Self Care | Payer: Medicare HMO | Source: Ambulatory Visit | Attending: Hematology and Oncology | Admitting: Hematology and Oncology

## 2021-02-10 DIAGNOSIS — R921 Mammographic calcification found on diagnostic imaging of breast: Secondary | ICD-10-CM

## 2021-02-10 DIAGNOSIS — Z853 Personal history of malignant neoplasm of breast: Secondary | ICD-10-CM

## 2021-02-19 ENCOUNTER — Ambulatory Visit
Admission: EM | Admit: 2021-02-19 | Discharge: 2021-02-19 | Disposition: A | Discharge status: Home / Self Care | Payer: Medicare HMO | Attending: Emergency Medicine | Admitting: Emergency Medicine

## 2021-02-19 DIAGNOSIS — R319 Hematuria, unspecified: Secondary | ICD-10-CM

## 2021-02-19 DIAGNOSIS — R1032 Left lower quadrant pain: Secondary | ICD-10-CM

## 2021-02-19 DIAGNOSIS — K5732 Diverticulitis of large intestine without perforation or abscess without bleeding: Secondary | ICD-10-CM | POA: Diagnosis not present

## 2021-02-19 LAB — POCT URINALYSIS DIP (MANUAL ENTRY)
Bilirubin, UA: NEGATIVE
Glucose, UA: NEGATIVE mg/dL
Ketones, POC UA: NEGATIVE mg/dL
Leukocytes, UA: NEGATIVE
Nitrite, UA: NEGATIVE
Protein Ur, POC: NEGATIVE mg/dL
Spec Grav, UA: <=1.005 — AB (ref 1.010–1.025)
Urobilinogen, UA: 0.2 E.U./dL
pH, UA: 6 (ref 5.0–8.0)

## 2021-02-19 MED ORDER — AMOXICILLIN-POT CLAVULANATE 875-125 MG PO TABS: 1.0000 | ORAL_TABLET | Freq: Two times a day (BID) | ORAL | 0 refills | Status: AC

## 2021-02-19 NOTE — Discharge Instructions (Addendum)
Get rest and drink fluids Urine with blood please follow up with PCP for recheck and to ensure symptoms have resolved within the next two weeks Will cover of diverticulitis given symptoms and recent diagnosis Augmentin prescribed Follow up with PCP as needed If you experience new or worsening symptoms return or go to ER such as fever, chills, nausea, vomiting, diarrhea, bloody or dark tarry stools, constipation, urinary symptoms, worsening abdominal discomfort, symptoms that do not improve with medications, inability to keep fluids down, etc..Marland Kitchen

## 2021-02-19 NOTE — ED Triage Notes (Signed)
Pt presents with lower abdominal pain for past few days. Pt states she has h/o diverticulitis and was treated with augmentin a few weeks ago for same

## 2021-02-19 NOTE — ED Provider Notes (Addendum)
[facility]   [id number] 02/19/21 Arrival Time: 1026  CC: ABDOMINAL DISCOMFORT  SUBJECTIVE:  [name] Black is a 70 y.o. female who presents with complaint of abdominal discomfort that began few days ago.  Denies a precipitating event, trauma.  Does admit to recent travel and antibiotic use.  Intermittent sharp lower abdominal discomfort.  Denies alleviating factors.  Worse with eating larger meals.  Last BM yesterday with smaller stool.  Reports recent diagnosis and treated for diverticulitis, states symptoms today are similar.  Reports mild nausea, bloating, and subjective fever yesterday. Some constipation  Denies vomiting, chest pain, SOB, diarrhea, hematochezia, melena, dysuria, difficulty urinating, increased frequency or urgency, flank pain, loss of bowel or bladder function, vaginal discharge, vaginal odor, vaginal bleeding, dyspareunia, pelvic pain.     No LMP recorded. Patient is postmenopausal.  ROS: As per HPI.  All other pertinent ROS negative.     Past Medical History:  Diagnosis Date   Cancer ([place]) 01/2017   right breast cancer   GERD (gastroesophageal reflux disease)    H/O seasonal allergies    History of radiation therapy 03/30/17-04/28/17   right breast was treated to 42.72 Gy, right breast boost 10 Gy   Hypothyroidism    Personal history of radiation therapy    Past Surgical History:  Procedure Laterality Date   BREAST BIOPSY Right 01/2017   BREAST LUMPECTOMY Right 02/17/2017   COLONOSCOPY     COLONOSCOPY N/A 01/09/2014   Procedure: COLONOSCOPY;  Surgeon: Rogene Houston, MD;  Location: [facility];  Service: Endoscopy;  Laterality: N/A;  1030   Cyst removed from left knee     Fibroid removed and cyst removed from ovary     RADIOACTIVE SEED GUIDED PARTIAL MASTECTOMY WITH AXILLARY SENTINEL LYMPH NODE BIOPSY Right 02/17/2017   Procedure: RADIOACTIVE SEED GUIDED PARTIAL MASTECTOMY WITH AXILLARY SENTINEL LYMPH NODE BIOPSY;  Surgeon: Rolm Bookbinder,  MD;  Location: [facility];  Service: General;  Laterality: Right;   Right knee arthroscopy     No Known Allergies No current facility-administered medications on file prior to encounter.   Current Outpatient Medications on File Prior to Encounter  Medication Sig Dispense Refill   anastrozole (ARIMIDEX) 1 MG tablet TAKE 1 TABLET BY MOUTH EVERY DAY 90 tablet 3   b complex vitamins tablet Take 1 tablet by mouth daily.     Calcium Citrate-Vitamin D (CITRACAL + D PO) Take 2 tablets by mouth daily in the afternoon.     Estradiol 0.5 MG/0.5GM GEL Place 1 Dose onto the skin 2 (two) times a week.     fluticasone (FLONASE) 50 MCG/ACT nasal spray Place 1 spray into both nostrils as needed for allergies or rhinitis (as needed seasonally).     Green Tea 150 MG CAPS Take by mouth.     ibuprofen (ADVIL) 200 MG tablet Take 1 tablet (200 mg total) by mouth as needed. 30 tablet    levothyroxine (SYNTHROID) 125 MCG tablet TAKE ONE (1) TABLET BY MOUTH EVERY DAY BEFORE BREAKFAST 90 tablet 3   Misc Natural Products (BLACK CHERRY CONCENTRATE) LIQD Take 2 Doses/Fill by mouth daily.     Omega-3 Fatty Acids (FISH OIL OMEGA-3 PO) Take by mouth.     omeprazole (PRILOSEC) 20 MG capsule TAKE ONE (1) CAPSULE EACH DAY 30 capsule 5   VITAMIN D, CHOLECALCIFEROL, PO Take by mouth.     Social History   Socioeconomic History   Marital status: Married    Spouse name: Not  on file   Number of children: 1   Years of education: Not on file   Highest education level: Not on file  Occupational History   Not on file  Tobacco Use   Smoking status: Former    Packs/day: 0.50    Years: 10.00    Pack years: 5.00    Types: Cigarettes    Quit date: 03/07/1982    Years since quitting: 38.9   Smokeless tobacco: Never  Vaping Use   Vaping Use: Never used  Substance and Sexual Activity   Alcohol use: Yes    Alcohol/week: 7.0 standard drinks    Types: 7 Glasses of wine per week    Comment: 1-2 glasses of wine or  beer a night   Drug use: No   Sexual activity: Not on file  Other Topics Concern   Not on file  Social History Narrative   Not on file   Social Determinants of Health   Financial Resource Strain: Not on file  Food Insecurity: Not on file  Transportation Needs: Not on file  Physical Activity: Not on file  Stress: Not on file  Social Connections: Not on file  Intimate Partner Violence: Not on file   Family History  Problem Relation Age of Onset   Breast cancer Mother 73   Breast cancer Maternal Aunt    Breast cancer Maternal Aunt    Colon cancer Neg Hx      OBJECTIVE:  Vitals:   02/19/21 1039  BP: (!) 144/93  Pulse: 72  Resp: 18  Temp: 98.8 F (37.1 C)  SpO2: 95%    General appearance: Alert; NAD HEENT: NCAT.  Oropharynx clear.  Lungs: clear to auscultation bilaterally without adventitious breath sounds Heart: regular rate and rhythm.   Abdomen: soft, non-distended; normal active bowel sounds; non-tender to light and deep palpation; nontender at McBurney's point; no guarding Back: no CVA tenderness Extremities: no edema; symmetrical with no gross deformities Skin: warm and dry Neurologic: normal gait Psychological: alert and cooperative; normal mood and affect  LABS: Results for orders placed or performed during the hospital encounter of 02/19/21 (from the past 24 hour(s))  POCT urinalysis dipstick     Status: Abnormal   Collection Time: 02/19/21 10:56 AM  Result Value Ref Range   Color, UA yellow yellow   Clarity, UA clear clear   Glucose, UA negative negative mg/dL   Bilirubin, UA negative negative   Ketones, POC UA negative negative mg/dL   Spec Grav, UA <=1.005 (A) 1.010 - 1.025   Blood, UA moderate (A) negative   pH, UA 6.0 5.0 - 8.0   Protein Ur, POC negative negative mg/dL   Urobilinogen, UA 0.2 0.2 or 1.0 E.U./dL   Nitrite, UA Negative Negative   Leukocytes, UA Negative Negative    ASSESSMENT & PLAN:  1. Bilateral lower abdominal discomfort    2. Diverticulitis of colon   3. Hematuria, unspecified type     Meds ordered this encounter  Medications   amoxicillin-clavulanate (AUGMENTIN) 875-125 MG tablet    Sig: Take 1 tablet by mouth every 12 (twelve) hours for 10 days.    Dispense:  20 tablet    Refill:  0    Order Specific Question:   Supervising Provider    Answer:   Raylene Everts [[id number]]     Get rest and drink fluids Urine with blood please follow up with PCP for recheck and to ensure symptoms have resolved within the next two weeks Will  cover of diverticulitis given symptoms and recent diagnosis Augmentin prescribed Follow up with PCP as needed If you experience new or worsening symptoms return or go to ER such as fever, chills, nausea, vomiting, diarrhea, bloody or dark tarry stools, constipation, urinary symptoms, worsening abdominal discomfort, symptoms that do not improve with medications, inability to keep fluids down, etc...   Reviewed expectations re: course of current medical issues. Questions answered. Outlined signs and symptoms indicating need for more acute intervention. Patient verbalized understanding. After Visit Summary given.   Lestine Box, PA-C 02/19/21 [place], [place], [place] 02/19/21 1136

## 2021-04-14 ENCOUNTER — Encounter (HOSPITAL_BASED_OUTPATIENT_CLINIC_OR_DEPARTMENT_OTHER): Payer: Self-pay

## 2021-04-14 ENCOUNTER — Ambulatory Visit (HOSPITAL_BASED_OUTPATIENT_CLINIC_OR_DEPARTMENT_OTHER): Admit: 2021-04-14 | Payer: Medicare HMO | Admitting: Orthopedic Surgery

## 2021-04-14 SURGERY — EXCISION METACARPAL MASS
Anesthesia: Regional | Laterality: Right

## 2021-07-07 ENCOUNTER — Other Ambulatory Visit: Payer: Self-pay

## 2021-07-07 ENCOUNTER — Ambulatory Visit
Admission: RE | Admit: 2021-07-07 | Discharge: 2021-07-07 | Disposition: A | Discharge status: Home / Self Care | Payer: Medicare HMO | Source: Ambulatory Visit | Attending: Hematology and Oncology | Admitting: Hematology and Oncology

## 2021-07-07 DIAGNOSIS — Z780 Asymptomatic menopausal state: Secondary | ICD-10-CM

## 2021-07-07 DIAGNOSIS — M8580 Other specified disorders of bone density and structure, unspecified site: Secondary | ICD-10-CM

## 2021-07-21 ENCOUNTER — Telehealth: Payer: Self-pay | Admitting: *Deleted

## 2021-07-21 NOTE — Telephone Encounter (Signed)
Per MD pt recent bone density test showing t score -2.3.  pt states she does not want to start Prolia at this time.  Pt states she currently takes Citracal once daily and will increase to twice daily as well as begin weight bearing exercise.  MD supportive of pt decision and plan.

## 2021-10-07 ENCOUNTER — Other Ambulatory Visit: Payer: Self-pay | Admitting: "Endocrinology

## 2021-10-07 ENCOUNTER — Telehealth: Payer: Self-pay | Admitting: "Endocrinology

## 2021-10-07 DIAGNOSIS — E039 Hypothyroidism, unspecified: Secondary | ICD-10-CM

## 2021-10-07 NOTE — Telephone Encounter (Signed)
Labs updated and sent to [facility]. ?

## 2021-10-07 NOTE — Telephone Encounter (Signed)
Can you update labs for [facility]? Thanks

## 2021-10-09 LAB — VITAMIN D 25 HYDROXY (VIT D DEFICIENCY, FRACTURES): Vit D, 25-Hydroxy: 39.7 ng/mL (ref 30.0–100.0)

## 2021-10-09 LAB — T4, FREE: Free T4: 1.72 ng/dL (ref 0.82–1.77)

## 2021-10-09 LAB — TSH: TSH: 0.357 u[IU]/mL — ABNORMAL LOW (ref 0.450–4.500)

## 2021-10-14 ENCOUNTER — Ambulatory Visit (INDEPENDENT_AMBULATORY_CARE_PROVIDER_SITE_OTHER): Payer: Medicare HMO | Admitting: "Endocrinology

## 2021-10-14 ENCOUNTER — Encounter: Payer: Self-pay | Admitting: "Endocrinology

## 2021-10-14 ENCOUNTER — Other Ambulatory Visit: Payer: Self-pay

## 2021-10-14 VITALS — BP 116/78 | HR 84 | Ht 66.0 in | Wt 164.0 lb

## 2021-10-14 DIAGNOSIS — Z5320 Procedure and treatment not carried out because of patient's decision for unspecified reasons: Secondary | ICD-10-CM

## 2021-10-14 DIAGNOSIS — E782 Mixed hyperlipidemia: Secondary | ICD-10-CM | POA: Diagnosis not present

## 2021-10-14 DIAGNOSIS — E039 Hypothyroidism, unspecified: Secondary | ICD-10-CM | POA: Diagnosis not present

## 2021-10-14 DIAGNOSIS — R7303 Prediabetes: Secondary | ICD-10-CM

## 2021-10-14 LAB — POCT GLYCOSYLATED HEMOGLOBIN (HGB A1C): HbA1c, POC (controlled diabetic range): 5.7 % (ref 0.0–7.0)

## 2021-10-14 MED ORDER — LEVOTHYROXINE SODIUM 112 MCG PO TABS: 112.0000 ug | ORAL_TABLET | Freq: Every day | ORAL | 1 refills | Status: DC

## 2021-10-14 NOTE — Progress Notes (Signed)
10/14/2021              Endocrinology follow-up note   Subjective:    Patient ID: [name] Black, female    DOB: 23-Dec-1950,    Past Medical History:  Diagnosis Date   Cancer ([place]) 01/2017   right breast cancer   GERD (gastroesophageal reflux disease)    H/O seasonal allergies    History of radiation therapy 03/30/17-04/28/17   right breast was treated to 42.72 Gy, right breast boost 10 Gy   Hypothyroidism    Personal history of radiation therapy    Past Surgical History:  Procedure Laterality Date   BREAST BIOPSY Right 01/2017   BREAST LUMPECTOMY Right 02/17/2017   COLONOSCOPY     COLONOSCOPY N/A 01/09/2014   Procedure: COLONOSCOPY;  Surgeon: Rogene Houston, MD;  Location: [facility];  Service: Endoscopy;  Laterality: N/A;  1030   Cyst removed from left knee     Fibroid removed and cyst removed from ovary     [place] LYMPH NODE BIOPSY Right 02/17/2017   Procedure: RADIOACTIVE SEED GUIDED PARTIAL MASTECTOMY WITH AXILLARY SENTINEL LYMPH NODE BIOPSY;  Surgeon: Rolm Bookbinder, MD;  Location: [facility];  Service: General;  Laterality: Right;   Right knee arthroscopy     Social History   Socioeconomic History   Marital status: Married    Spouse name: Not on file   Number of children: 1   Years of education: Not on file   Highest education level: Not on file  Occupational History   Not on file  Tobacco Use   Smoking status: Former    Packs/day: 0.50    Years: 10.00    Pack years: 5.00    Types: Cigarettes    Quit date: 03/07/1982    Years since quitting: 39.6   Smokeless tobacco: Never  Vaping Use   Vaping Use: Never used  Substance and Sexual Activity   Alcohol use: Yes    Alcohol/week: 7.0 standard drinks    Types: 7 Glasses of wine per week    Comment: 1-2 glasses of wine or beer a night   Drug use: No   Sexual activity: Not on file  Other Topics Concern   Not on file  Social  History Narrative   Not on file   Social Determinants of Health   Financial Resource Strain: Not on file  Food Insecurity: Not on file  Transportation Needs: Not on file  Physical Activity: Not on file  Stress: Not on file  Social Connections: Not on file   Outpatient Encounter Medications as of 10/14/2021  Medication Sig   levothyroxine (SYNTHROID) 112 MCG tablet Take 1 tablet (112 mcg total) by mouth daily before breakfast.   Multiple Vitamins-Minerals (MULTIVITAMIN ADULTS 50+ PO) Take 1 tablet by mouth daily in the afternoon.   anastrozole (ARIMIDEX) 1 MG tablet TAKE 1 TABLET BY MOUTH EVERY DAY (Patient taking differently: 0.5 mg.)   b complex vitamins tablet Take 1 tablet by mouth daily. As needed   Calcium Citrate-Vitamin D (CITRACAL + D PO) Take 2 tablets by mouth daily in the afternoon.   Estradiol 0.5 MG/0.5GM GEL Place 1 Dose onto the skin 2 (two) times a week.   fluticasone (FLONASE) 50 MCG/ACT nasal spray Place 1 spray into both nostrils as needed for allergies or rhinitis (as needed seasonally).   Green Tea 150 MG CAPS Take by mouth daily as needed.   ibuprofen (ADVIL)  200 MG tablet Take 1 tablet (200 mg total) by mouth as needed.   Misc Natural Products (BLACK CHERRY CONCENTRATE) LIQD Take 2 Doses/Fill by mouth daily.   omeprazole (PRILOSEC) 20 MG capsule TAKE ONE (1) CAPSULE EACH DAY   [DISCONTINUED] levothyroxine (SYNTHROID) 125 MCG tablet TAKE ONE (1) TABLET BY MOUTH EVERY DAY BEFORE BREAKFAST   [DISCONTINUED] Omega-3 Fatty Acids (FISH OIL OMEGA-3 PO) Take by mouth. (Patient not taking: Reported on 10/14/2021)   [DISCONTINUED] VITAMIN D, CHOLECALCIFEROL, PO Take by mouth. (Patient not taking: Reported on 10/14/2021)   No facility-administered encounter medications on file as of 10/14/2021.   ALLERGIES: No Known Allergies VACCINATION STATUS: Immunization History  Administered Date(s) Administered   Influenza, High Dose Seasonal PF 06/09/2017   Influenza, Quadrivalent,  Recombinant, Inj, Pf 06/25/2019   Influenza,inj,quad, With Preservative 06/06/2018   PFIZER(Purple Top)SARS-COV-2 Vaccination 09/28/2019, 10/18/2019   Zoster Recombinat (Shingrix) 03/17/2018    HPI 71 year old female patient with  long-standing primary hypothyroidism on levothyroxine 125 micrograms p.o. every morning before breakfast.  She reports compliance with her medication.  She is returning for follow-up with repeat thyroid function test.  Her labs are showing slight over replacement.     She has no new complaints today.   She also has history of prediabetes.  She denies polydipsia, polyuria.   She has more consistent energy level, except unable to lose weight.  She denies palpitations, heat intolerance, tremors.   She remains without treatment for dyslipidemia, previously declined statins.  She also has prediabetes with point-of-care A1c today of 5.7%.   Review of Systems  Limited as above.  Objective:    BP 116/78    Pulse 84    Ht 5\' 6"  (1.676 m)    Wt 164 lb (74.4 kg)    BMI 26.47 kg/m   Wt Readings from Last 3 Encounters:  10/14/21 164 lb (74.4 kg)  12/30/20 161 lb 11.2 oz (73.3 kg)  10/14/20 161 lb (73 kg)    Physical Exam   Results for orders placed or performed in visit on 10/14/21  HgB A1c  Result Value Ref Range   Hemoglobin A1C     HbA1c POC (<> result, manual entry)     HbA1c, POC (prediabetic range)     HbA1c, POC (controlled diabetic range) 5.7 0.0 - 7.0 %   Complete Blood Count (Most recent): Lab Results  Component Value Date   WBC 6.7 01/26/2017   HGB 13.7 01/26/2017   HCT 40.8 01/26/2017   MCV 93.3 01/26/2017   PLT 259 01/26/2017   Chemistry (most recent): Lab Results  Component Value Date   NA 139 03/13/2019   K 5.3 (H) 03/13/2019   CL 100 03/13/2019   CO2 24 03/13/2019   BUN 14 03/13/2019   CREATININE 0.80 01/12/2021   Lipid Panel     Component Value Date/Time   CHOL 201 (A) 06/20/2018 0000   TRIG 108 06/20/2018 0000   HDL 59  06/20/2018 0000   LDLCALC 120 06/20/2018 0000     Assessment & Plan:   1. Hypothyroidism -Her previsit thyroid function tests are consistent with slight over replacement.  I discussed and lowered her levothyroxine to 112 mcg p.o. daily before breakfast.     - We discussed about the correct intake of her thyroid hormone, on empty stomach at fasting, with water, separated by at least 30 minutes from breakfast and other medications,  and separated by more than 4 hours from calcium, iron, multivitamins, acid reflux medications (  PPIs). -Patient is made aware of the fact that thyroid hormone replacement is needed for life, dose to be adjusted by periodic monitoring of thyroid function tests.   She had subcentimeter nodules will need follow-up ultrasound When she gets appropriate insurance coverage. -She was recently found to have osteopenia, currently on vitamin D supplements, 1000 units daily.  2.  History of prediabetes -Point-of-care A1c today emains consistent with prediabetes at 5.7%.  She will not need medication intervention at this time.    she acknowledges that there is a room for improvement in her food and drink choices. - Suggestion is made for her to avoid simple carbohydrates  from her diet including Cakes, Sweet Desserts, Ice Cream, Soda (diet and regular), Sweet Tea, Candies, Chips, Cookies, Store Bought Juices, Alcohol in Excess of  1-2 drinks a day, Artificial Sweeteners,  Coffee Creamer, and "Sugar-free" Products, Lemonade. This will help patient to have more stable blood glucose profile and potentially avoid unintended weight gain.  -3.  Dyslipidemia-last measured LDL was 120.  -She wishes to avoid treatment with statins , and wishes to continue with her PMD about her lipid care.    She is advised to continue follow-up with her PMD.   I spent 21 minutes in the care of the patient today including review of labs from Thyroid Function, CMP, and other relevant labs ;  imaging/biopsy records (current and previous including abstractions from other facilities); face-to-face time discussing  her lab results and symptoms, medications doses, her options of short and long term treatment based on the latest standards of care / guidelines;   and documenting the encounter.  [name] Black  participated in the discussions, expressed understanding, and voiced agreement with the above plans.  All questions were answered to her satisfaction. she is encouraged to contact clinic should she have any questions or concerns prior to her return visit.   Follow up plan: Return in about 6 months (around 04/13/2022) for F/U with Pre-visit Labs, A1c -NV.  Glade Lloyd, MD Phone: [phone]  Fax: [phone]  -  This note was partially dictated with voice recognition software. Similar sounding words can be transcribed inadequately or may not  be corrected upon review.  10/14/2021, 9:43 AM

## 2021-11-09 ENCOUNTER — Other Ambulatory Visit (HOSPITAL_COMMUNITY): Payer: Self-pay | Admitting: Internal Medicine

## 2021-11-09 ENCOUNTER — Other Ambulatory Visit: Payer: Self-pay | Admitting: Internal Medicine

## 2021-11-09 DIAGNOSIS — E041 Nontoxic single thyroid nodule: Secondary | ICD-10-CM

## 2021-11-20 ENCOUNTER — Other Ambulatory Visit: Payer: Self-pay

## 2021-11-20 ENCOUNTER — Ambulatory Visit (HOSPITAL_COMMUNITY)
Admission: RE | Admit: 2021-11-20 | Discharge: 2021-11-20 | Disposition: A | Discharge status: Home / Self Care | Payer: Medicare HMO | Source: Ambulatory Visit | Attending: Internal Medicine | Admitting: Internal Medicine

## 2021-11-20 DIAGNOSIS — E041 Nontoxic single thyroid nodule: Secondary | ICD-10-CM | POA: Insufficient documentation

## 2021-12-29 LAB — BASIC METABOLIC PANEL
BUN: 14 (ref 4–21)
Creatinine: 1 (ref 0.5–1.1)
Potassium: 4.6 mEq/L (ref 3.5–5.1)
Sodium: 144 (ref 137–147)

## 2021-12-29 LAB — LIPID PANEL
Cholesterol: 222 — AB (ref 0–200)
HDL: 54 (ref 35–70)
LDL Cholesterol: 130
Triglycerides: 213 — AB (ref 40–160)

## 2021-12-29 LAB — COMPREHENSIVE METABOLIC PANEL: Calcium: 9.6 (ref 8.7–10.7)

## 2022-01-01 ENCOUNTER — Other Ambulatory Visit: Payer: Self-pay | Admitting: Hematology and Oncology

## 2022-01-01 DIAGNOSIS — Z853 Personal history of malignant neoplasm of breast: Secondary | ICD-10-CM

## 2022-01-07 ENCOUNTER — Other Ambulatory Visit: Payer: Self-pay | Admitting: "Endocrinology

## 2022-01-11 ENCOUNTER — Telehealth: Payer: Self-pay | Admitting: "Endocrinology

## 2022-01-11 NOTE — Telephone Encounter (Signed)
Pt left a VM stating she has about 3 tablets left of her levothyroxine. She said the pharmacy is telling her she can not get a refill until August.Please Advise ?

## 2022-01-11 NOTE — Telephone Encounter (Signed)
Spoke with Magda Paganini from [facility], she stated pt's levothyroxine 119mg is scheduled to be refilled today. Pt made aware and voiced understanding. ?

## 2022-02-12 ENCOUNTER — Ambulatory Visit
Admission: RE | Admit: 2022-02-12 | Discharge: 2022-02-12 | Disposition: A | Discharge status: Home / Self Care | Payer: Medicare HMO | Source: Ambulatory Visit | Attending: Hematology and Oncology | Admitting: Hematology and Oncology

## 2022-02-12 ENCOUNTER — Other Ambulatory Visit: Payer: Self-pay | Admitting: Hematology and Oncology

## 2022-02-12 DIAGNOSIS — R921 Mammographic calcification found on diagnostic imaging of breast: Secondary | ICD-10-CM

## 2022-02-12 DIAGNOSIS — Z853 Personal history of malignant neoplasm of breast: Secondary | ICD-10-CM

## 2022-02-14 ENCOUNTER — Other Ambulatory Visit: Payer: Self-pay | Admitting: Hematology and Oncology

## 2022-03-03 NOTE — Progress Notes (Signed)
Patient Care Team: Asencion Noble, MD as PCP - General (Internal Medicine) Brien Few, MD as Consulting Physician (Obstetrics and Gynecology) Rolm Bookbinder, MD as Consulting Physician (General Surgery) Nicholas Lose, MD as Consulting Physician (Hematology and Oncology) Gery Pray, MD as Consulting Physician (Radiation Oncology) Delice Bison Charlestine Massed, NP as Nurse Practitioner (Hematology and Oncology)  DIAGNOSIS:  Encounter Diagnosis  Name Primary?   Malignant neoplasm of upper-outer quadrant of right breast in female, estrogen receptor positive ([place])     SUMMARY OF ONCOLOGIC HISTORY: Oncology History  Malignant neoplasm of upper-outer quadrant of right breast in female, estrogen receptor positive ([place])  01/19/2017 Initial Diagnosis   Screening detected right breast mass 5 mm size axilla negative biopsy: Grade 2 invasive ductal carcinoma with DCIS ER 95%, PR 60%, HER-2 negative ratio 1.38, Ki-67 12%, T1aN0 stage IA clinical stage   02/17/2017 Surgery   Right lumpectomy: Invasive ductal carcinoma, grade 2, 0.7 cm, DCIS intermediate grade, margins negative, lymphovascular invasion present, additional lateral margin residual IDC grade 2 with DCIS margins negative, 0/2 lymph nodes, T1b N0 stage IA ER 95%, PR 60%, HER-2 negative, Ki-67 12%   03/15/2017 Oncotype testing   Oncotype DX score 25, intermediate risk: 16% ROR with hormonal therapy alone   03/30/2017 - 04/26/2017 Radiation Therapy    Adjuvant radiation therapy   05/2017 -  Anti-estrogen oral therapy   Letrozole daily     CHIEF COMPLIANT: Follow-up of right breast cancer on anastrozole   INTERVAL HISTORY: [name] Black is a 71 y.o. with above-mentioned history of right breast cancer. She presents to the clinic today for follow-up. She states that she does stretching for joint stiffness. Denies vaginal dryness. She uses uberlube and it works fine for the vaginal dryness. Overall she is tolerating the anastrozole. She  has some discomfort around the surgical site. She does have osteopenia. Also she has jaw pain and acid reflux. She does walk for exercise she states that she walks forward and backwards up a hill.     ALLERGIES:  is allergic to other.  MEDICATIONS:  Current Outpatient Medications  Medication Sig Dispense Refill   anastrozole (ARIMIDEX) 1 MG tablet Take 0.5 tablets (0.5 mg total) by mouth daily. 90 tablet 3   b complex vitamins tablet Take 1 tablet by mouth daily. As needed     Calcium Citrate-Vitamin D (CITRACAL + D PO) Take 2 tablets by mouth daily in the afternoon.     Estradiol 0.5 MG/0.5GM GEL Place 1 Dose onto the skin once a week.     fluticasone (FLONASE) 50 MCG/ACT nasal spray Place 1 spray into both nostrils as needed for allergies or rhinitis (as needed seasonally).     Green Tea 150 MG CAPS Take by mouth daily as needed.     ibuprofen (ADVIL) 200 MG tablet Take 1 tablet (200 mg total) by mouth as needed. 30 tablet    levothyroxine (SYNTHROID) 112 MCG tablet Take 1 tablet (112 mcg total) by mouth daily before breakfast. 90 tablet 1   Misc Natural Products (BLACK CHERRY CONCENTRATE) LIQD Take 2 Doses/Fill by mouth daily.     Multiple Vitamins-Minerals (MULTIVITAMIN ADULTS 50+ PO) Take 1 tablet by mouth daily in the afternoon.     omeprazole (PRILOSEC) 20 MG capsule TAKE ONE (1) CAPSULE EACH DAY 30 capsule 5   No current facility-administered medications for this visit.    PHYSICAL EXAMINATION: ECOG PERFORMANCE STATUS: 1 - Symptomatic but completely ambulatory  Vitals:   03/17/22 1043  BP: Marland Kitchen)  166/88  Pulse: 90  Resp: 18  Temp: 98.1 F (36.7 C)  SpO2: 97%   Filed Weights   03/17/22 1043  Weight: 162 lb 14.4 oz (73.9 kg)    BREAST: No palpable masses or nodules in either right or left breasts. No palpable axillary supraclavicular or infraclavicular adenopathy no breast tenderness or nipple discharge. (exam performed in the presence of a chaperone)  LABORATORY DATA:   I have reviewed the data as listed    Latest Ref Rng & Units 01/12/2021    2:27 PM 03/13/2019   11:18 AM 06/20/2018   12:00 AM  CMP  Glucose 65 - 99 mg/dL  98    BUN 8 - 27 mg/dL  14  8      Creatinine 0.44 - 1.00 mg/dL 0.80  0.89  0.8      Sodium 134 - 144 mmol/L  139    Potassium 3.5 - 5.2 mmol/L  5.3    Chloride 96 - 106 mmol/L  100    CO2 20 - 29 mmol/L  24    Calcium 8.7 - 10.3 mg/dL  9.5    Total Protein 6.0 - 8.5 g/dL  6.8    Total Bilirubin 0.0 - 1.2 mg/dL  0.3    Alkaline Phos 39 - 117 IU/L  97    AST 0 - 40 IU/L  23    ALT 0 - 32 IU/L  29       This result is from an external source.    Lab Results  Component Value Date   WBC 6.7 01/26/2017   HGB 13.7 01/26/2017   HCT 40.8 01/26/2017   MCV 93.3 01/26/2017   PLT 259 01/26/2017   NEUTROABS 4.4 01/26/2017    ASSESSMENT & PLAN:  Malignant neoplasm of upper-outer quadrant of right breast in female, estrogen receptor positive ([place]) 02/17/2017 Right lumpectomy: Invasive ductal carcinoma, grade 2, 0.7 cm, DCIS intermediate grade, margins negative, lymphovascular invasion present, additional lateral margin residual IDC grade 2 with DCIS margins negative, 0/2 lymph nodes, T1b N0 stage IA ER 95%, PR 60%, HER-2 negative, Ki-67 12%   Oncotype DX score 25 Adjuvant radiation 03/30/2017- 04/26/2017   Treatment plan: Adjuvant antiestrogen therapy with letrozole 2.5 mg daily started 05/08/2017 switched to anastrozole 02/22/2019 currently on half a tablet of anastrozole. Anastrozole toxicities: Doing much better with regards to hot flashes and arthralgias and myalgias.   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   Left shoulder pain: Frozen shoulder.  Much better with physical therapy.   Breast cancer surveillance:  Mammogram  02/12/2022: Benign breast density category B  Breast exam 03/17/2022: No palpable lumps or nodules of  concern  bone density 07/07/2021: T score -2.3: Osteopenia: Recommended calcium vitamin D and she does not want to take bisphosphonates because of concern for side effects.   She is planning to travel to [place] ([place] scholar trip) Return to clinic  in 1 year for follow-up     No orders of the defined types were placed in this encounter.  The patient has a good understanding of the overall plan. she agrees with it. she will call with any problems that may develop before the next visit here. Total time spent: 30 mins including face to face time and time spent for planning, charting and co-ordination of care   Harriette Ohara, MD 03/17/22    I  Gardiner Coins am scribing for Dr. Lindi Adie  I have reviewed the above documentation for accuracy and completeness, and I agree with the above.

## 2022-03-17 ENCOUNTER — Inpatient Hospital Stay: Payer: Medicare HMO | Attending: Hematology and Oncology | Admitting: Hematology and Oncology

## 2022-03-17 ENCOUNTER — Other Ambulatory Visit: Payer: Self-pay

## 2022-03-17 DIAGNOSIS — C50411 Malignant neoplasm of upper-outer quadrant of right female breast: Secondary | ICD-10-CM | POA: Insufficient documentation

## 2022-03-17 DIAGNOSIS — Z170 Estrogen receptor positive status [ER+]: Secondary | ICD-10-CM | POA: Diagnosis not present

## 2022-03-17 DIAGNOSIS — Z79811 Long term (current) use of aromatase inhibitors: Secondary | ICD-10-CM | POA: Diagnosis not present

## 2022-03-17 MED ORDER — ESTRADIOL 0.5 MG/0.5GM TD GEL: 1.0000 | TRANSDERMAL | Status: AC

## 2022-03-17 MED ORDER — ANASTROZOLE 1 MG PO TABS: 0.5000 mg | ORAL_TABLET | Freq: Every day | ORAL | 3 refills | Status: DC

## 2022-03-17 NOTE — Assessment & Plan Note (Addendum)
02/17/2017 Right lumpectomy: Invasive ductal carcinoma, grade 2, 0.7 cm, DCIS intermediate grade, margins negative, lymphovascular invasion present, additional lateral margin residual IDC grade 2 with DCIS margins negative, 0/2 lymph nodes, T1b N0 stage IA ER 95%, PR 60%, HER-2 negative, Ki-67 12%  Oncotype DX score 25 Adjuvant radiation 03/30/2017- 04/26/2017  Treatment plan: Adjuvant antiestrogen therapy withletrozole 2.5 mg dailystarted 9/2/2018switched to anastrozole 6/18/2020currently on half a tablet of anastrozole. Anastrozole toxicities:Doing much better with regards to hot flashes and arthralgias and myalgias.  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  Left shoulder pain:Frozen shoulder. Much better with physical therapy.  Breast cancer surveillance:  Mammogram  02/12/2022: Benign breast density category B Breast exam 03/17/2022: No palpable lumps or nodules of concern  bone density 07/07/2021: T score -2.3: Osteopenia: Recommended calcium vitamin D and she does not want to take bisphosphonates because of concern for side effects.  She is planning to travel to [place] ([place] scholar trip) Return to clinic in 1 year for follow-up

## 2022-04-07 LAB — T4, FREE: Free T4: 1.3 ng/dL (ref 0.82–1.77)

## 2022-04-07 LAB — TSH: TSH: 2.66 u[IU]/mL (ref 0.450–4.500)

## 2022-04-08 ENCOUNTER — Other Ambulatory Visit: Payer: Self-pay | Admitting: "Endocrinology

## 2022-04-13 ENCOUNTER — Encounter: Payer: Self-pay | Admitting: "Endocrinology

## 2022-04-13 ENCOUNTER — Ambulatory Visit: Payer: Medicare HMO | Admitting: "Endocrinology

## 2022-04-13 VITALS — BP 132/84 | HR 84 | Ht 66.0 in | Wt 164.4 lb

## 2022-04-13 DIAGNOSIS — R7303 Prediabetes: Secondary | ICD-10-CM | POA: Diagnosis not present

## 2022-04-13 DIAGNOSIS — E782 Mixed hyperlipidemia: Secondary | ICD-10-CM | POA: Diagnosis not present

## 2022-04-13 DIAGNOSIS — E039 Hypothyroidism, unspecified: Secondary | ICD-10-CM | POA: Diagnosis not present

## 2022-04-13 LAB — POCT GLYCOSYLATED HEMOGLOBIN (HGB A1C): HbA1c, POC (controlled diabetic range): 5.7 % (ref 0.0–7.0)

## 2022-04-13 NOTE — Progress Notes (Signed)
04/13/2022             Endocrinology follow-up note   Subjective:    Patient ID: [name] Black, female    DOB: 06-24-1951,    Past Medical History:  Diagnosis Date   Cancer ([place]) 01/2017   right breast cancer   GERD (gastroesophageal reflux disease)    H/O seasonal allergies    History of radiation therapy 03/30/17-04/28/17   right breast was treated to 42.72 Gy, right breast boost 10 Gy   Hypothyroidism    Personal history of radiation therapy    Past Surgical History:  Procedure Laterality Date   BREAST BIOPSY Right 01/2017   BREAST LUMPECTOMY Right 02/17/2017   COLONOSCOPY     COLONOSCOPY N/A 01/09/2014   Procedure: COLONOSCOPY;  Surgeon: Rogene Houston, MD;  [facility];  Service: Endoscopy;  Laterality: N/A;  1030   Cyst removed from left knee     Fibroid removed and cyst removed from ovary     [place] LYMPH NODE BIOPSY Right 02/17/2017   Procedure: RADIOACTIVE SEED GUIDED PARTIAL MASTECTOMY WITH AXILLARY SENTINEL LYMPH NODE BIOPSY;  Surgeon: Rolm Bookbinder, MD;  Location: [facility];  Service: General;  Laterality: Right;   Right knee arthroscopy     Social History   Socioeconomic History   Marital status: Married    Spouse name: Not on file   Number of children: 1   Years of education: Not on file   Highest education level: Not on file  Occupational History   Not on file  Tobacco Use   Smoking status: Former    Packs/day: 0.50    Years: 10.00    Total pack years: 5.00    Types: Cigarettes    Quit date: 03/07/1982    Years since quitting: 40.1   Smokeless tobacco: Never  Vaping Use   Vaping Use: Never used  Substance and Sexual Activity   Alcohol use: Yes    Alcohol/week: 7.0 standard drinks of alcohol    Types: 7 Glasses of wine per week    Comment: 1-2 glasses of wine or beer a night   Drug use: No   Sexual activity: Not on file  Other Topics Concern   Not on file   Social History Narrative   Not on file   Social Determinants of Health   Financial Resource Strain: Not on file  Food Insecurity: Not on file  Transportation Needs: Not on file  Physical Activity: Not on file  Stress: Not on file  Social Connections: Not on file   Outpatient Encounter Medications as of 04/13/2022  Medication Sig   anastrozole (ARIMIDEX) 1 MG tablet Take 0.5 tablets (0.5 mg total) by mouth daily.   b complex vitamins tablet Take 1 tablet by mouth daily. As needed   Calcium Citrate-Vitamin D (CITRACAL + D PO) Take 2 tablets by mouth daily in the afternoon.   Estradiol 0.5 MG/0.5GM GEL Place 1 Dose onto the skin once a week.   fluticasone (FLONASE) 50 MCG/ACT nasal spray Place 1 spray into both nostrils as needed for allergies or rhinitis (as needed seasonally).   Green Tea 150 MG CAPS Take by mouth daily as needed.   ibuprofen (ADVIL) 200 MG tablet Take 1 tablet (200 mg total) by mouth as needed.   levothyroxine (SYNTHROID) 112 MCG tablet TAKE 1 TABLET BY MOUTH DAILY BEFORE BREAKFAST.   Misc Natural Products (BLACK CHERRY CONCENTRATE) LIQD Take  2 Doses/Fill by mouth daily.   Multiple Vitamins-Minerals (MULTIVITAMIN ADULTS 50+ PO) Take 1 tablet by mouth daily in the afternoon.   omeprazole (PRILOSEC) 20 MG capsule TAKE ONE (1) CAPSULE EACH DAY   No facility-administered encounter medications on file as of 04/13/2022.   ALLERGIES: Allergies  Allergen Reactions   Other Other (See Comments)    Seasonal allergies causing runny eyes, itchy throat     VACCINATION STATUS: Immunization History  Administered Date(s) Administered   Influenza, High Dose Seasonal PF 06/09/2017   Influenza, Quadrivalent, Recombinant, Inj, Pf 06/25/2019   Influenza,inj,quad, With Preservative 06/06/2018   PFIZER(Purple Top)SARS-COV-2 Vaccination 09/28/2019, 10/18/2019   Zoster Recombinat (Shingrix) 03/17/2018    HPI 71 year old female patient with  long-standing primary hypothyroidism on  levothyroxine 1 112 mcg p.o. daily before breakfast.    She reports compliance with her medication.  She is returning for follow-up with repeat thyroid function test.  Her labs are showing slight over replacement.     She has no new complaints today.   She also has history of prediabetes.  She denies polydipsia, polyuria.   She has more consistent energy level, except unable to lose weight.  She denies palpitations, heat intolerance, tremors.   She remains without treatment for dyslipidemia, previously declined statins.  She also has prediabetes with point-of-care A1c today of 5.7%.   Review of Systems  Limited as above.  Objective:    BP 132/84   Pulse 84   Ht '5\' 6"'$  (1.676 m)   Wt 164 lb 6.4 oz (74.6 kg)   BMI 26.53 kg/m   Wt Readings from Last 3 Encounters:  04/13/22 164 lb 6.4 oz (74.6 kg)  03/17/22 162 lb 14.4 oz (73.9 kg)  10/14/21 164 lb (74.4 kg)    Physical Exam   Results for orders placed or performed in visit on 63/01/60  Basic metabolic panel  Result Value Ref Range   BUN 14 4 - 21   Creatinine 1.0 0.5 - 1.1   Potassium 4.6 3.5 - 5.1 mEq/L   Sodium 144 137 - 147  Comprehensive metabolic panel  Result Value Ref Range   Calcium 9.6 8.7 - 10.7  Lipid panel  Result Value Ref Range   Triglycerides 213 (A) 40 - 160   Cholesterol 222 (A) 0 - 200   HDL 54 35 - 70   LDL Cholesterol 130   HgB A1c  Result Value Ref Range   Hemoglobin A1C     HbA1c POC (<> result, manual entry)     HbA1c, POC (prediabetic range)     HbA1c, POC (controlled diabetic range) 5.7 0.0 - 7.0 %   Complete Blood Count (Most recent): Lab Results  Component Value Date   WBC 6.7 01/26/2017   HGB 13.7 01/26/2017   HCT 40.8 01/26/2017   MCV 93.3 01/26/2017   PLT 259 01/26/2017   Chemistry (most recent): Lab Results  Component Value Date   NA 144 12/29/2021   K 4.6 12/29/2021   CL 100 03/13/2019   CO2 24 03/13/2019   BUN 14 12/29/2021   CREATININE 1.0 12/29/2021   Lipid Panel      Component Value Date/Time   CHOL 222 (A) 12/29/2021 0000   TRIG 213 (A) 12/29/2021 0000   HDL 54 12/29/2021 0000   LDLCALC 130 12/29/2021 0000     Assessment & Plan:   1. Hypothyroidism -Her previsit thyroid function tests are consistent with slight over replacement.  I discussed and lowered her levothyroxine  to 112 mcg p.o. daily before breakfast.     - We discussed about the correct intake of her thyroid hormone, on empty stomach at fasting, with water, separated by at least 30 minutes from breakfast and other medications,  and separated by more than 4 hours from calcium, iron, multivitamins, acid reflux medications (PPIs). -Patient is made aware of the fact that thyroid hormone replacement is needed for life, dose to be adjusted by periodic monitoring of thyroid function tests.   She had subcentimeter nodules will need follow-up ultrasound When she gets appropriate insurance coverage. -She was recently found to have osteopenia, currently on vitamin D supplements, 1000 units daily.  2.  History of prediabetes  3.  Hyperlipidemia-LDL worsening at 130 -Point-of-care A1c today remains consistent with prediabetes with A1c of 5.7%. Medication intervention at this time.  However, considering her hyperlipidemia coupled with the fact that she does not want to take statins, she is a good candidate for lifestyle medicine.    - she acknowledges that there is a room for improvement in her food and drink choices. - Suggestion is made for her to avoid simple carbohydrates  from her diet including Cakes, Sweet Desserts, Ice Cream, Soda (diet and regular), Sweet Tea, Candies, Chips, Cookies, Store Bought Juices, Alcohol , Artificial Sweeteners,  Coffee Creamer, and "Sugar-free" Products, Lemonade. This will help patient to have more stable blood glucose profile and potentially avoid unintended weight gain.  The following Lifestyle Medicine recommendations according to [place]  [facility]) were discussed and and offered to patient and she  agrees to start the journey:  A. Whole Foods, Plant-Based Nutrition comprising of fruits and vegetables, plant-based proteins, whole-grain carbohydrates was discussed in detail with the patient.   A list for source of those nutrients were also provided to the patient.  Patient will use only water or unsweetened tea for hydration. B.  The need to stay away from risky substances including alcohol, smoking; obtaining 7 to 9 hours of restorative sleep, at least 150 minutes of moderate intensity exercise weekly, the importance of healthy social connections,  and stress management techniques were discussed. C.  A full color page of  Calorie density of various food groups per pound showing examples of each food groups was provided to the patient.   She is advised to continue follow-up with her PMD.   I spent 32 minutes in the care of the patient today including review of labs from Thyroid Function, CMP, and other relevant labs ; imaging/biopsy records (current and previous including abstractions from other facilities); face-to-face time discussing  her lab results and symptoms, medications doses, her options of short and long term treatment based on the latest standards of care / guidelines;   and documenting the encounter. Risk reduction counseling performed per USPSTF guidelines to reduce obesity and cardiovascular risk factors.    Janika C Pickford  participated in the discussions, expressed understanding, and voiced agreement with the above plans.  All questions were answered to her satisfaction. she is encouraged to contact clinic should she have any questions or concerns prior to her return visit.   Follow up plan: Return in about 3 months (around 07/14/2022) for Fasting Labs  in AM B4 8.  Glade Lloyd, MD Phone: [phone]  Fax: [phone]  -  This note was partially dictated with voice recognition software. Similar sounding  words can be transcribed inadequately or may not  be corrected upon review.  04/13/2022, 1:48 PM

## 2022-07-07 ENCOUNTER — Other Ambulatory Visit: Payer: Self-pay | Admitting: "Endocrinology

## 2022-07-13 LAB — TSH: TSH: 2.84 u[IU]/mL (ref 0.450–4.500)

## 2022-07-13 LAB — LIPID PANEL
Chol/HDL Ratio: 4.2 ratio (ref 0.0–4.4)
Cholesterol, Total: 232 mg/dL — ABNORMAL HIGH (ref 100–199)
HDL: 55 mg/dL (ref >39)
LDL Chol Calc (NIH): 146 mg/dL — ABNORMAL HIGH (ref 0–99)
Triglycerides: 171 mg/dL — ABNORMAL HIGH (ref 0–149)
VLDL Cholesterol Cal: 31 mg/dL (ref 5–40)

## 2022-07-13 LAB — T4, FREE: Free T4: 1.46 ng/dL (ref 0.82–1.77)

## 2022-07-16 ENCOUNTER — Encounter: Payer: Self-pay | Admitting: "Endocrinology

## 2022-07-16 ENCOUNTER — Ambulatory Visit: Payer: Medicare HMO | Admitting: "Endocrinology

## 2022-07-16 VITALS — BP 136/84 | HR 88 | Ht 66.0 in | Wt 163.4 lb

## 2022-07-16 DIAGNOSIS — E782 Mixed hyperlipidemia: Secondary | ICD-10-CM | POA: Diagnosis not present

## 2022-07-16 DIAGNOSIS — E039 Hypothyroidism, unspecified: Secondary | ICD-10-CM

## 2022-07-16 DIAGNOSIS — Z5320 Procedure and treatment not carried out because of patient's decision for unspecified reasons: Secondary | ICD-10-CM

## 2022-07-16 DIAGNOSIS — R7303 Prediabetes: Secondary | ICD-10-CM

## 2022-07-16 NOTE — Progress Notes (Unsigned)
07/16/2022             Endocrinology follow-up note   Subjective:    Patient ID: [name] Black, female    DOB: [birth year],    Past Medical History:  Diagnosis Date   Cancer ([place]) 01/2017   right breast cancer   GERD (gastroesophageal reflux disease)    H/O seasonal allergies    History of radiation therapy 03/30/17-04/28/17   right breast was treated to 42.72 Gy, right breast boost 10 Gy   Hypothyroidism    Personal history of radiation therapy    Past Surgical History:  Procedure Laterality Date   BREAST BIOPSY Right 01/2017   BREAST LUMPECTOMY Right 02/17/2017   COLONOSCOPY     COLONOSCOPY N/A 01/09/2014   Procedure: COLONOSCOPY;  Surgeon: Rogene Houston, MD;  [facility];  Service: Endoscopy;  Laterality: N/A;  1030   Cyst removed from left knee     Fibroid removed and cyst removed from ovary     [place] LYMPH NODE BIOPSY Right 02/17/2017   Procedure: RADIOACTIVE SEED GUIDED PARTIAL MASTECTOMY WITH AXILLARY SENTINEL LYMPH NODE BIOPSY;  Surgeon: Rolm Bookbinder, MD;  Location: [facility];  Service: General;  Laterality: Right;   Right knee arthroscopy     Social History   Socioeconomic History   Marital status: Married    Spouse name: Not on file   Number of children: 1   Years of education: Not on file   Highest education level: Not on file  Occupational History   Not on file  Tobacco Use   Smoking status: Former    Packs/day: 0.50    Years: 10.00    Total pack years: 5.00    Types: Cigarettes    Quit date: 03/07/1982    Years since quitting: 40.3   Smokeless tobacco: Never  Vaping Use   Vaping Use: Never used  Substance and Sexual Activity   Alcohol use: Yes    Alcohol/week: 7.0 standard drinks of alcohol    Types: 7 Glasses of wine per week    Comment: 1-2 glasses of wine or beer a night   Drug use: No   Sexual activity: Not on file  Other Topics Concern   Not on  file  Social History Narrative   Not on file   Social Determinants of Health   Financial Resource Strain: Not on file  Food Insecurity: Not on file  Transportation Needs: Not on file  Physical Activity: Not on file  Stress: Not on file  Social Connections: Not on file   Outpatient Encounter Medications as of 07/16/2022  Medication Sig   Omega-3 Fatty Acids (FISH OIL OMEGA-3 PO) Take 1 tablet by mouth daily.   anastrozole (ARIMIDEX) 1 MG tablet Take 0.5 tablets (0.5 mg total) by mouth daily.   b complex vitamins tablet Take 1 tablet by mouth daily. As needed   Calcium Citrate-Vitamin D (CITRACAL + D PO) Take 2 tablets by mouth daily in the afternoon.   Estradiol 0.5 MG/0.5GM GEL Place 1 Dose onto the skin once a week.   fluticasone (FLONASE) 50 MCG/ACT nasal spray Place 1 spray into both nostrils as needed for allergies or rhinitis (as needed seasonally).   Green Tea 150 MG CAPS Take by mouth daily as needed.   ibuprofen (ADVIL) 200 MG tablet Take 1 tablet (200 mg total) by mouth as needed.   levothyroxine (SYNTHROID) 112 MCG tablet TAKE 1 TABLET  BY MOUTH EVERY DAY BEFORE BREAKFAST   Misc Natural Products (BLACK CHERRY CONCENTRATE) LIQD Take 2 Doses/Fill by mouth daily.   Multiple Vitamins-Minerals (MULTIVITAMIN ADULTS 50+ PO) Take 1 tablet by mouth daily in the afternoon.   omeprazole (PRILOSEC) 20 MG capsule TAKE ONE (1) CAPSULE EACH DAY   No facility-administered encounter medications on file as of 07/16/2022.   ALLERGIES: Allergies  Allergen Reactions   Other Other (See Comments)    Seasonal allergies causing runny eyes, itchy throat     VACCINATION STATUS: Immunization History  Administered Date(s) Administered   Influenza, High Dose Seasonal PF 06/09/2017   Influenza, Quadrivalent, Recombinant, Inj, Pf 06/25/2019   Influenza,inj,quad, With Preservative 06/06/2018   PFIZER(Purple Top)SARS-COV-2 Vaccination 09/28/2019, 10/18/2019   Zoster Recombinat (Shingrix) 03/17/2018     HPI 71 year old female patient with  long-standing primary hypothyroidism on levothyroxine 1 112 mcg p.o. daily before breakfast.    She reports compliance with her medication.  She is returning for follow-up with repeat thyroid function test.  Her labs are showing slight over replacement.     She has no new complaints today.   She also has history of prediabetes.  She denies polydipsia, polyuria.   She has more consistent energy level, except unable to lose weight.  She denies palpitations, heat intolerance, tremors.   She remains without treatment for dyslipidemia, previously declined statins.  She also has prediabetes with point-of-care A1c today of 5.7%.   Review of Systems  Limited as above.  Objective:    BP 136/84   Pulse 88   Ht '5\' 6"'$  (1.676 m)   Wt 163 lb 6.4 oz (74.1 kg)   BMI 26.37 kg/m   Wt Readings from Last 3 Encounters:  07/16/22 163 lb 6.4 oz (74.1 kg)  04/13/22 164 lb 6.4 oz (74.6 kg)  03/17/22 162 lb 14.4 oz (73.9 kg)    Physical Exam   Results for orders placed or performed in visit on 04/13/22  TSH  Result Value Ref Range   TSH 2.840 0.450 - 4.500 uIU/mL  T4, free  Result Value Ref Range   Free T4 1.46 0.82 - 1.77 ng/dL  Lipid panel  Result Value Ref Range   Cholesterol, Total 232 (H) 100 - 199 mg/dL   Triglycerides 171 (H) 0 - 149 mg/dL   HDL 55 >39 mg/dL   VLDL Cholesterol Cal 31 5 - 40 mg/dL   LDL Chol Calc (NIH) 146 (H) 0 - 99 mg/dL   Chol/HDL Ratio 4.2 0.0 - 4.4 ratio  Basic metabolic panel  Result Value Ref Range   BUN 14 4 - 21   Creatinine 1.0 0.5 - 1.1   Potassium 4.6 3.5 - 5.1 mEq/L   Sodium 144 137 - 147  Comprehensive metabolic panel  Result Value Ref Range   Calcium 9.6 8.7 - 10.7  Lipid panel  Result Value Ref Range   Triglycerides 213 (A) 40 - 160   Cholesterol 222 (A) 0 - 200   HDL 54 35 - 70   LDL Cholesterol 130   HgB A1c  Result Value Ref Range   Hemoglobin A1C     HbA1c POC (<> result, manual entry)     HbA1c,  POC (prediabetic range)     HbA1c, POC (controlled diabetic range) 5.7 0.0 - 7.0 %   Complete Blood Count (Most recent): Lab Results  Component Value Date   WBC 6.7 01/26/2017   HGB 13.7 01/26/2017   HCT 40.8 01/26/2017   MCV  93.3 01/26/2017   PLT 259 01/26/2017   Chemistry (most recent): Lab Results  Component Value Date   NA 144 12/29/2021   K 4.6 12/29/2021   CL 100 03/13/2019   CO2 24 03/13/2019   BUN 14 12/29/2021   CREATININE 1.0 12/29/2021   Lipid Panel     Component Value Date/Time   CHOL 232 (H) 07/12/2022 0747   TRIG 171 (H) 07/12/2022 0747   HDL 55 07/12/2022 0747   CHOLHDL 4.2 07/12/2022 0747   LDLCALC 146 (H) 07/12/2022 0747   LABVLDL 31 07/12/2022 0747     Assessment & Plan:   1. Hypothyroidism -Her previsit thyroid function tests are consistent with slight over replacement.  I discussed and lowered her levothyroxine to 112 mcg p.o. daily before breakfast.     - We discussed about the correct intake of her thyroid hormone, on empty stomach at fasting, with water, separated by at least 30 minutes from breakfast and other medications,  and separated by more than 4 hours from calcium, iron, multivitamins, acid reflux medications (PPIs). -Patient is made aware of the fact that thyroid hormone replacement is needed for life, dose to be adjusted by periodic monitoring of thyroid function tests.   She had subcentimeter nodules will need follow-up ultrasound When she gets appropriate insurance coverage. -She was recently found to have osteopenia, currently on vitamin D supplements, 1000 units daily.  2.  History of prediabetes  3.  Hyperlipidemia-LDL worsening at 130 -Point-of-care A1c today remains consistent with prediabetes with A1c of 5.7%. Medication intervention at this time.  However, considering her hyperlipidemia coupled with the fact that she does not want to take statins, she is a good candidate for lifestyle medicine.    - she acknowledges that  there is a room for improvement in her food and drink choices. - Suggestion is made for her to avoid simple carbohydrates  from her diet including Cakes, Sweet Desserts, Ice Cream, Soda (diet and regular), Sweet Tea, Candies, Chips, Cookies, Store Bought Juices, Alcohol , Artificial Sweeteners,  Coffee Creamer, and "Sugar-free" Products, Lemonade. This will help patient to have more stable blood glucose profile and potentially avoid unintended weight gain.  The following Lifestyle Medicine recommendations according to [place]  [facility]) were discussed and and offered to patient and she  agrees to start the journey:  A. Whole Foods, Plant-Based Nutrition comprising of fruits and vegetables, plant-based proteins, whole-grain carbohydrates was discussed in detail with the patient.   A list for source of those nutrients were also provided to the patient.  Patient will use only water or unsweetened tea for hydration. B.  The need to stay away from risky substances including alcohol, smoking; obtaining 7 to 9 hours of restorative sleep, at least 150 minutes of moderate intensity exercise weekly, the importance of healthy social connections,  and stress management techniques were discussed. C.  A full color page of  Calorie density of various food groups per pound showing examples of each food groups was provided to the patient.   She is advised to continue follow-up with her PMD.   I spent 32 minutes in the care of the patient today including review of labs from Thyroid Function, CMP, and other relevant labs ; imaging/biopsy records (current and previous including abstractions from other facilities); face-to-face time discussing  her lab results and symptoms, medications doses, her options of short and long term treatment based on the latest standards of care / guidelines;  and documenting the encounter. Risk reduction counseling performed per USPSTF guidelines to reduce obesity  and cardiovascular risk factors.    Symphany C Bouchie  participated in the discussions, expressed understanding, and voiced agreement with the above plans.  All questions were answered to her satisfaction. she is encouraged to contact clinic should she have any questions or concerns prior to her return visit.   Follow up plan: Return in about 6 months (around 01/14/2023) for Fasting Labs  in AM B4 8, A1c -[place].  Glade Lloyd, MD Phone: [phone]  Fax: [phone]  -  This note was partially dictated with voice recognition software. Similar sounding words can be transcribed inadequately or may not  be corrected upon review.  07/16/2022, 2:40 PM

## 2022-07-16 NOTE — Patient Instructions (Signed)

## 2022-08-24 ENCOUNTER — Other Ambulatory Visit: Payer: Self-pay | Admitting: "Endocrinology

## 2022-08-24 DIAGNOSIS — E039 Hypothyroidism, unspecified: Secondary | ICD-10-CM

## 2022-09-13 ENCOUNTER — Other Ambulatory Visit (HOSPITAL_COMMUNITY): Payer: Self-pay | Admitting: Internal Medicine

## 2022-09-13 DIAGNOSIS — R1031 Right lower quadrant pain: Secondary | ICD-10-CM

## 2022-09-14 ENCOUNTER — Ambulatory Visit (HOSPITAL_COMMUNITY)
Admission: RE | Admit: 2022-09-14 | Discharge: 2022-09-14 | Disposition: A | Discharge status: Home / Self Care | Payer: Medicare HMO | Source: Ambulatory Visit | Attending: Internal Medicine | Admitting: Internal Medicine

## 2022-09-14 DIAGNOSIS — R1031 Right lower quadrant pain: Secondary | ICD-10-CM

## 2022-09-14 MED ORDER — IOHEXOL 300 MG/ML  SOLN
100.0000 mL | Freq: Once | INTRAMUSCULAR | Status: AC | PRN
Start: 2022-09-14 — End: 2022-09-14
  Administered 2022-09-14: 100 mL via INTRAVENOUS

## 2022-12-28 ENCOUNTER — Other Ambulatory Visit: Payer: Self-pay | Admitting: "Endocrinology

## 2022-12-28 DIAGNOSIS — E039 Hypothyroidism, unspecified: Secondary | ICD-10-CM

## 2023-01-12 LAB — COMPREHENSIVE METABOLIC PANEL
ALT: 24 IU/L (ref 0–32)
AST: 20 IU/L (ref 0–40)
Albumin/Globulin Ratio: 1.5 (ref 1.2–2.2)
Albumin: 4.4 g/dL (ref 3.9–4.9)
Alkaline Phosphatase: 99 IU/L (ref 44–121)
BUN/Creatinine Ratio: 18 (ref 12–28)
BUN: 16 mg/dL (ref 8–27)
Bilirubin Total: 0.3 mg/dL (ref 0.0–1.2)
CO2: 21 mmol/L (ref 20–29)
Calcium: 9.7 mg/dL (ref 8.7–10.3)
Chloride: 102 mmol/L (ref 96–106)
Creatinine, Ser: 0.91 mg/dL (ref 0.57–1.00)
Globulin, Total: 2.9 g/dL (ref 1.5–4.5)
Glucose: 102 mg/dL — ABNORMAL HIGH (ref 70–99)
Potassium: 4.5 mmol/L (ref 3.5–5.2)
Sodium: 142 mmol/L (ref 134–144)
Total Protein: 7.3 g/dL (ref 6.0–8.5)
eGFR: 68 mL/min/{1.73_m2} (ref >59)

## 2023-01-12 LAB — TSH: TSH: 3.79 u[IU]/mL (ref 0.450–4.500)

## 2023-01-12 LAB — LIPID PANEL
Chol/HDL Ratio: 3.4 ratio (ref 0.0–4.4)
Cholesterol, Total: 185 mg/dL (ref 100–199)
HDL: 54 mg/dL (ref >39)
LDL Chol Calc (NIH): 110 mg/dL — ABNORMAL HIGH (ref 0–99)
Triglycerides: 119 mg/dL (ref 0–149)
VLDL Cholesterol Cal: 21 mg/dL (ref 5–40)

## 2023-01-12 LAB — T4, FREE: Free T4: 1.48 ng/dL (ref 0.82–1.77)

## 2023-01-14 ENCOUNTER — Encounter: Payer: Self-pay | Admitting: "Endocrinology

## 2023-01-14 ENCOUNTER — Ambulatory Visit: Payer: Medicare HMO | Admitting: "Endocrinology

## 2023-01-14 VITALS — BP 140/80 | HR 92 | Ht 66.0 in | Wt 163.0 lb

## 2023-01-14 DIAGNOSIS — R7303 Prediabetes: Secondary | ICD-10-CM

## 2023-01-14 DIAGNOSIS — E782 Mixed hyperlipidemia: Secondary | ICD-10-CM

## 2023-01-14 DIAGNOSIS — E039 Hypothyroidism, unspecified: Secondary | ICD-10-CM | POA: Diagnosis not present

## 2023-01-14 LAB — POCT GLYCOSYLATED HEMOGLOBIN (HGB A1C): HbA1c, POC (prediabetic range): 5.4 % — AB (ref 5.7–6.4)

## 2023-01-14 NOTE — Progress Notes (Signed)
01/14/2023             Endocrinology follow-up note   Subjective:    Patient ID: [name] Black, female    DOB: [birth year],    Past Medical History:  Diagnosis Date   Cancer (HCC) 01/2017   right breast cancer   GERD (gastroesophageal reflux disease)    H/O seasonal allergies    History of radiation therapy 03/30/17-04/28/17   right breast was treated to 42.72 Gy, right breast boost 10 Gy   Hypothyroidism    Personal history of radiation therapy    Past Surgical History:  Procedure Laterality Date   BREAST BIOPSY Right 01/2017   BREAST LUMPECTOMY Right 02/17/2017   COLONOSCOPY     COLONOSCOPY N/A 01/09/2014   Procedure: COLONOSCOPY;  Surgeon: Malissa Hippo, MD;  Location: [facility];  Service: Endoscopy;  Laterality: N/A;  1030   Cyst removed from left knee     Fibroid removed and cyst removed from ovary     RADIOACTIVE SEED GUIDED PARTIAL MASTECTOMY WITH AXILLARY SENTINEL LYMPH NODE BIOPSY Right 02/17/2017   Procedure: RADIOACTIVE SEED GUIDED PARTIAL MASTECTOMY WITH AXILLARY SENTINEL LYMPH NODE BIOPSY;  Surgeon: Emelia Loron, MD;  Location: [facility];  Service: General;  Laterality: Right;   Right knee arthroscopy     Social History   Socioeconomic History   Marital status: Married    Spouse name: Not on file   Number of children: 1   Years of education: Not on file   Highest education level: Not on file  Occupational History   Not on file  Tobacco Use   Smoking status: Former    Packs/day: 0.50    Years: 10.00    Additional pack years: 0.00    Total pack years: 5.00    Types: Cigarettes    Quit date: 03/07/1982    Years since quitting: 40.8   Smokeless tobacco: Never  Vaping Use   Vaping Use: Never used  Substance and Sexual Activity   Alcohol use: Yes    Alcohol/week: 7.0 standard drinks of alcohol    Types: 7 Glasses of wine per week    Comment: 1-2 glasses of wine or beer a night   Drug use: No   Sexual activity: Not on file   Other Topics Concern   Not on file  Social History Narrative   Not on file   Social Determinants of Health   Financial Resource Strain: Not on file  Food Insecurity: Not on file  Transportation Needs: Not on file  Physical Activity: Not on file  Stress: Not on file  Social Connections: Not on file   Outpatient Encounter Medications as of 01/14/2023  Medication Sig   rosuvastatin (CRESTOR) 10 MG tablet Take 10 mg by mouth daily.   anastrozole (ARIMIDEX) 1 MG tablet Take 0.5 tablets (0.5 mg total) by mouth daily.   b complex vitamins tablet Take 1 tablet by mouth daily. As needed   Calcium Citrate-Vitamin D (CITRACAL + D PO) Take 2 tablets by mouth daily in the afternoon.   Estradiol 0.5 MG/0.5GM GEL Place 1 Dose onto the skin once a week.   fluticasone (FLONASE) 50 MCG/ACT nasal spray Place 1 spray into both nostrils as needed for allergies or rhinitis (as needed seasonally).   Green Tea 150 MG CAPS Take by mouth daily as needed.   ibuprofen (ADVIL) 200 MG tablet Take 1 tablet (200 mg total) by mouth as needed.   levothyroxine (SYNTHROID) 112  MCG tablet TAKE 1 TABLET BY MOUTH EVERY DAY BEFORE BREAKFAST   Misc Natural Products (BLACK CHERRY CONCENTRATE) LIQD Take 2 Doses/Fill by mouth daily.   Multiple Vitamins-Minerals (MULTIVITAMIN ADULTS 50+ PO) Take 1 tablet by mouth daily in the afternoon.   Omega-3 Fatty Acids (FISH OIL OMEGA-3 PO) Take 1 tablet by mouth daily.   omeprazole (PRILOSEC) 20 MG capsule TAKE ONE (1) CAPSULE EACH DAY   No facility-administered encounter medications on file as of 01/14/2023.   ALLERGIES: Allergies  Allergen Reactions   Other Other (See Comments)    Seasonal allergies causing runny eyes, itchy throat     VACCINATION STATUS: Immunization History  Administered Date(s) Administered   Influenza, High Dose Seasonal PF 06/09/2017   Influenza, Quadrivalent, Recombinant, Inj, Pf 06/25/2019   Influenza,inj,quad, With Preservative 06/06/2018    PFIZER(Purple Top)SARS-COV-2 Vaccination 09/28/2019, 10/18/2019   Zoster Recombinat (Shingrix) 03/17/2018    HPI  72 year old female patient with  long-standing primary hypothyroidism on levothyroxine 112 mcg p.o. daily before breakfast.    She reports compliance with her medication.  She is returning for follow-up with repeat thyroid function test and lipid panel.  Her thyroid function tests are consistent with appropriate replacement.  She also improve her lipid profile after making some adjustment in her diet.    She presents with steady weight.  She was started on a statin this week by her PMD.   She has no new complaints today.   She also has history of prediabetes-improved A1c today at 5.4%.  She denies polydipsia, polyuria.   She has more consistent energy level, except unable to lose weight.  She denies palpitations, heat intolerance, tremors.   She remains without treatment for dyslipidemia.  She also has prediabetes with point-of-care A1c today of 5.7%.   Review of Systems  Limited as above.  Objective:    BP (!) 140/80 Comment: L arm with manuel cuff  Pulse 92   Ht 5\' 6"  (1.676 m)   Wt 163 lb (73.9 kg)   BMI 26.31 kg/m   Wt Readings from Last 3 Encounters:  01/14/23 163 lb (73.9 kg)  07/16/22 163 lb 6.4 oz (74.1 kg)  04/13/22 164 lb 6.4 oz (74.6 kg)    Physical Exam   Results for orders placed or performed in visit on 01/14/23  HgB A1c  Result Value Ref Range   Hemoglobin A1C     HbA1c POC (<> result, manual entry)     HbA1c, POC (prediabetic range) 5.4 (A) 5.7 - 6.4 %   HbA1c, POC (controlled diabetic range)     Complete Blood Count (Most recent): Lab Results  Component Value Date   WBC 6.7 01/26/2017   HGB 13.7 01/26/2017   HCT 40.8 01/26/2017   MCV 93.3 01/26/2017   PLT 259 01/26/2017   Chemistry (most recent): Lab Results  Component Value Date   NA 142 01/11/2023   K 4.5 01/11/2023   CL 102 01/11/2023   CO2 21 01/11/2023   BUN 16 01/11/2023    CREATININE 0.91 01/11/2023   Lipid Panel     Component Value Date/Time   CHOL 185 01/11/2023 0759   TRIG 119 01/11/2023 0759   HDL 54 01/11/2023 0759   CHOLHDL 3.4 01/11/2023 0759   LDLCALC 110 (H) 01/11/2023 0759   LABVLDL 21 01/11/2023 0759     Assessment & Plan:   1. Hypothyroidism -Her previsit thyroid function tests are consistent with appropriate replacement.  She is advised to continue levothyroxine 112 mcg  p.o. daily before breakfast.    - We discussed about the correct intake of her thyroid hormone, on empty stomach at fasting, with water, separated by at least 30 minutes from breakfast and other medications,  and separated by more than 4 hours from calcium, iron, multivitamins, acid reflux medications (PPIs). -Patient is made aware of the fact that thyroid hormone replacement is needed for life, dose to be adjusted by periodic monitoring of thyroid function tests.   She had subcentimeter nodules will need follow-up ultrasound When she gets appropriate insurance coverage. -She was recently found to have osteopenia, currently on vitamin D supplements, 1000 units daily.  2.  History of prediabetes  3.  Hyperlipidemia-LDL worsening at 146  She has accepted a statin prescription from her PMD-last week.  Her LDL, drawn before she started a statin was 110 mg per DL, improving from 098 mg per DL largely due to her dietary adjustment. That also help lower her A1c to 5.4%.  She is still a good candidate for lifestyle medicine.  - she acknowledges that there is a room for improvement in her food and drink choices. - Suggestion is made for her to avoid simple carbohydrates  from her diet including Cakes, Sweet Desserts, Ice Cream, Soda (diet and regular), Sweet Tea, Candies, Chips, Cookies, Store Bought Juices, Alcohol , Artificial Sweeteners,  Coffee Creamer, and "Sugar-free" Products, Lemonade. This will help patient to have more stable blood glucose profile and potentially avoid  unintended weight gain.  The following Lifestyle Medicine recommendations according to American College of Lifestyle Medicine  [facility]) were discussed and and offered to patient and she  agrees to start the journey:  A. Whole Foods, Plant-Based Nutrition comprising of fruits and vegetables, plant-based proteins, whole-grain carbohydrates was discussed in detail with the patient.   A list for source of those nutrients were also provided to the patient.  Patient will use only water or unsweetened tea for hydration. B.  The need to stay away from risky substances including alcohol, smoking; obtaining 7 to 9 hours of restorative sleep, at least 150 minutes of moderate intensity exercise weekly, the importance of healthy social connections,  and stress management techniques were discussed. C.  A full color page of  Calorie density of various food groups per pound showing examples of each food groups was provided to the patient.   She is advised to continue follow-up with her PMD.    I spent  26  minutes in the care of the patient today including review of labs from Thyroid Function, CMP, and other relevant labs ; imaging/biopsy records (current and previous including abstractions from other facilities); face-to-face time discussing  her lab results and symptoms, medications doses, her options of short and long term treatment based on the latest standards of care / guidelines;   and documenting the encounter.  [name] Black  participated in the discussions, expressed understanding, and voiced agreement with the above plans.  All questions were answered to her satisfaction. she is encouraged to contact clinic should she have any questions or concerns prior to her return visit.    Follow up plan: Return in about 6 months (around 07/17/2023) for F/U with Pre-visit Labs.  Marquis Lunch, MD Phone: [phone]  Fax: [phone]  -  This note was partially dictated with voice recognition software. Similar  sounding words can be transcribed inadequately or may not  be corrected upon review.  01/14/2023, 11:32 AM

## 2023-02-03 ENCOUNTER — Other Ambulatory Visit: Payer: Self-pay | Admitting: Hematology and Oncology

## 2023-02-03 DIAGNOSIS — Z9889 Other specified postprocedural states: Secondary | ICD-10-CM

## 2023-02-14 ENCOUNTER — Other Ambulatory Visit: Payer: Self-pay | Admitting: Hematology and Oncology

## 2023-03-01 ENCOUNTER — Telehealth: Payer: Self-pay | Admitting: *Deleted

## 2023-03-01 NOTE — Telephone Encounter (Signed)
[name] Black states she felt a lump on her right breast. Has appt on 7/15 with Dr Pamelia Hoit, with mammogram afterwards.   Encouraged her to call and see if [facility] can get her in sooner. They were able to change to 7/3. Message to scheduler her with Dr Pamelia Hoit sooner than 7/15, after [facility]

## 2023-03-09 ENCOUNTER — Ambulatory Visit
Admission: RE | Admit: 2023-03-09 | Discharge: 2023-03-09 | Disposition: A | Discharge status: Home / Self Care | Payer: Medicare HMO | Source: Ambulatory Visit | Attending: Hematology and Oncology | Admitting: Hematology and Oncology

## 2023-03-09 ENCOUNTER — Other Ambulatory Visit: Payer: Self-pay | Admitting: Hematology and Oncology

## 2023-03-09 DIAGNOSIS — Z9889 Other specified postprocedural states: Secondary | ICD-10-CM

## 2023-03-14 ENCOUNTER — Telehealth: Payer: Self-pay

## 2023-03-14 NOTE — Telephone Encounter (Signed)
Pt called to confirm appt with MD 7/15. She asks if she is OK to keep that appt based on scan results. Attempted to call pt and LVM for pt to keep scheduled appt.

## 2023-03-15 ENCOUNTER — Telehealth: Payer: Self-pay | Admitting: Hematology and Oncology

## 2023-03-15 NOTE — Telephone Encounter (Signed)
Called to try and reschedule appointment per scheduling message. Unable to get in touch with patient. Left voicemail.

## 2023-03-16 ENCOUNTER — Telehealth: Payer: Self-pay | Admitting: Hematology and Oncology

## 2023-03-16 NOTE — Telephone Encounter (Signed)
Called to try and reschedule appointment per scheduling message. Called x3 days and unable to get through to patient. Left a voicemail.

## 2023-03-19 NOTE — Progress Notes (Signed)
Patient Care Team: Carylon Perches, MD as PCP - General (Internal Medicine) Olivia Mackie, MD as Consulting Physician (Obstetrics and Gynecology) Emelia Loron, MD as Consulting Physician (General Surgery) Serena Croissant, MD as Consulting Physician (Hematology and Oncology) Antony Blackbird, MD as Consulting Physician (Radiation Oncology) Axel Filler Larna Daughters, NP as Nurse Practitioner (Hematology and Oncology)  DIAGNOSIS:  Encounter Diagnoses  Name Primary?   Malignant neoplasm of upper-outer quadrant of right breast in female, estrogen receptor positive (HCC) Yes   Postmenopausal     SUMMARY OF ONCOLOGIC HISTORY: Oncology History  Malignant neoplasm of upper-outer quadrant of right breast in female, estrogen receptor positive (HCC)  01/19/2017 Initial Diagnosis   Screening detected right breast mass 5 mm size axilla negative biopsy: Grade 2 invasive ductal carcinoma with DCIS ER 95%, PR 60%, HER-2 negative ratio 1.38, Ki-67 12%, T1aN0 stage IA clinical stage   02/17/2017 Surgery   Right lumpectomy: Invasive ductal carcinoma, grade 2, 0.7 cm, DCIS intermediate grade, margins negative, lymphovascular invasion present, additional lateral margin residual IDC grade 2 with DCIS margins negative, 0/2 lymph nodes, T1b N0 stage IA ER 95%, PR 60%, HER-2 negative, Ki-67 12%   03/15/2017 Oncotype testing   Oncotype DX score 25, intermediate risk: 16% ROR with hormonal therapy alone   03/30/2017 - 04/26/2017 Radiation Therapy    Adjuvant radiation therapy   05/2017 -  Anti-estrogen oral therapy   Letrozole daily     CHIEF COMPLIANT:  Follow-up of right breast cancer on anastrozole   INTERVAL HISTORY: [name] Black is a 72 y.o. with above-mentioned history of right breast cancer. Currently on anastrozole. She presents to the clinic today for follow-up. Pt reports that she is not walking, but she is doing the treadmill. She states that she is tired a lot and she is not sleeping well. Having  a lot of interrupted sleep.   ALLERGIES:  is allergic to other.  MEDICATIONS:  Current Outpatient Medications  Medication Sig Dispense Refill   anastrozole (ARIMIDEX) 1 MG tablet Take 0.5 tablets (0.5 mg total) by mouth daily.     b complex vitamins tablet Take 1 tablet by mouth daily. As needed     Calcium Citrate-Vitamin D (CITRACAL + D PO) Take 2 tablets by mouth daily in the afternoon.     Estradiol 0.5 MG/0.5GM GEL Place 1 Dose onto the skin once a week.     fluticasone (FLONASE) 50 MCG/ACT nasal spray Place 1 spray into both nostrils as needed for allergies or rhinitis (as needed seasonally).     Green Tea 150 MG CAPS Take by mouth daily as needed.     ibuprofen (ADVIL) 200 MG tablet Take 1 tablet (200 mg total) by mouth as needed. 30 tablet    levothyroxine (SYNTHROID) 112 MCG tablet TAKE 1 TABLET BY MOUTH EVERY DAY BEFORE BREAKFAST 90 tablet 0   Misc Natural Products (BLACK CHERRY CONCENTRATE) LIQD Take 2 Doses/Fill by mouth daily.     Multiple Vitamins-Minerals (MULTIVITAMIN ADULTS 50+ PO) Take 1 tablet by mouth daily in the afternoon.     Omega-3 Fatty Acids (FISH OIL OMEGA-3 PO) Take 1 tablet by mouth daily.     omeprazole (PRILOSEC) 20 MG capsule TAKE ONE (1) CAPSULE EACH DAY 30 capsule 5   rosuvastatin (CRESTOR) 10 MG tablet Take 10 mg by mouth daily.     No current facility-administered medications for this visit.    PHYSICAL EXAMINATION: ECOG PERFORMANCE STATUS: 1 - Symptomatic but completely ambulatory  Vitals:   03/21/23  1044  BP: (!) 153/89  Pulse: 88  Resp: 18  Temp: 97.8 F (36.6 C)  SpO2: 99%   Filed Weights   03/21/23 1044  Weight: 163 lb 11.2 oz (74.3 kg)    BREAST: No palpable masses or nodules in either right or left breasts. No palpable axillary supraclavicular or infraclavicular adenopathy no breast tenderness or nipple discharge. (exam performed in the presence of a chaperone)  LABORATORY DATA:  I have reviewed the data as listed    Latest  Ref Rng & Units 01/11/2023    7:59 AM 12/29/2021   12:00 AM 01/12/2021    2:27 PM  CMP  Glucose 70 - 99 mg/dL 841     BUN 8 - 27 mg/dL 16  14       Creatinine 0.57 - 1.00 mg/dL 3.24  1.0     4.01   Sodium 134 - 144 mmol/L 142  144       Potassium 3.5 - 5.2 mmol/L 4.5  4.6       Chloride 96 - 106 mmol/L 102     CO2 20 - 29 mmol/L 21     Calcium 8.7 - 10.3 mg/dL 9.7  9.6       Total Protein 6.0 - 8.5 g/dL 7.3     Total Bilirubin 0.0 - 1.2 mg/dL 0.3     Alkaline Phos 44 - 121 IU/L 99     AST 0 - 40 IU/L 20     ALT 0 - 32 IU/L 24        This result is from an external source.    Lab Results  Component Value Date   WBC 6.7 01/26/2017   HGB 13.7 01/26/2017   HCT 40.8 01/26/2017   MCV 93.3 01/26/2017   PLT 259 01/26/2017   NEUTROABS 4.4 01/26/2017    ASSESSMENT & PLAN:  Malignant neoplasm of upper-outer quadrant of right breast in female, estrogen receptor positive (HCC) 02/17/2017 Right lumpectomy: Invasive ductal carcinoma, grade 2, 0.7 cm, DCIS intermediate grade, margins negative, lymphovascular invasion present, additional lateral margin residual IDC grade 2 with DCIS margins negative, 0/2 lymph nodes, T1b N0 stage IA ER 95%, PR 60%, HER-2 negative, Ki-67 12%   Oncotype DX score 25 Adjuvant radiation 03/30/2017- 04/26/2017   Treatment plan: Adjuvant antiestrogen therapy with letrozole 2.5 mg daily started 05/08/2017 switched to anastrozole 02/22/2019 currently on half a tablet of anastrozole. Anastrozole toxicities: Doing much better with regards to hot flashes and arthralgias and myalgias. 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   Left shoulder pain: Frozen shoulder.  Much better with physical therapy.   Breast cancer surveillance:  Mammogram 03/09/2023: Benign (area of palpable concern no sonographic abnormality) breast density category  B Breast exam 03/21/2023: No palpable lumps or nodules of concern  bone density 07/07/2021: T score -2.3: Osteopenia: Recommended calcium vitamin D and she does not want to take bisphosphonates because of concern for side effects.   She is planning to travel to [place] [facility] scholar trip) Return to clinic  in 1 year for follow-up     Orders Placed This Encounter  Procedures   DG Bone Density    Standing Status:   Future    Standing Expiration Date:   03/20/2024    Order Specific Question:   Reason for Exam (SYMPTOM  OR  DIAGNOSIS REQUIRED)    Answer:   postmenopausal    Order Specific Question:   Preferred imaging location?    Answer:   [facility]   The patient has a good understanding of the overall plan. she agrees with it. she will call with any problems that may develop before the next visit here. Total time spent: 30 mins including face to face time and time spent for planning, charting and co-ordination of care   Tamsen Meek, MD 03/21/23    I Janan Ridge am acting as a Neurosurgeon for [employer]  I have reviewed the above documentation for accuracy and completeness, and I agree with the above.

## 2023-03-21 ENCOUNTER — Inpatient Hospital Stay: Payer: Medicare HMO | Attending: Hematology and Oncology | Admitting: Hematology and Oncology

## 2023-03-21 ENCOUNTER — Other Ambulatory Visit: Payer: Self-pay

## 2023-03-21 VITALS — BP 153/89 | HR 88 | Temp 97.8°F | Resp 18 | Ht 66.0 in | Wt 163.7 lb

## 2023-03-21 DIAGNOSIS — C50411 Malignant neoplasm of upper-outer quadrant of right female breast: Secondary | ICD-10-CM | POA: Diagnosis present

## 2023-03-21 DIAGNOSIS — Z170 Estrogen receptor positive status [ER+]: Secondary | ICD-10-CM | POA: Diagnosis not present

## 2023-03-21 DIAGNOSIS — M8580 Other specified disorders of bone density and structure, unspecified site: Secondary | ICD-10-CM | POA: Diagnosis not present

## 2023-03-21 DIAGNOSIS — M25512 Pain in left shoulder: Secondary | ICD-10-CM | POA: Diagnosis not present

## 2023-03-21 DIAGNOSIS — Z79811 Long term (current) use of aromatase inhibitors: Secondary | ICD-10-CM | POA: Diagnosis not present

## 2023-03-21 DIAGNOSIS — Z780 Asymptomatic menopausal state: Secondary | ICD-10-CM | POA: Diagnosis not present

## 2023-03-21 DIAGNOSIS — Z923 Personal history of irradiation: Secondary | ICD-10-CM | POA: Insufficient documentation

## 2023-03-21 DIAGNOSIS — M7502 Adhesive capsulitis of left shoulder: Secondary | ICD-10-CM | POA: Insufficient documentation

## 2023-03-21 MED ORDER — ANASTROZOLE 1 MG PO TABS: 0.5000 mg | ORAL_TABLET | Freq: Every day | ORAL | Status: DC

## 2023-03-21 NOTE — Assessment & Plan Note (Addendum)
02/17/2017 Right lumpectomy: Invasive ductal carcinoma, grade 2, 0.7 cm, DCIS intermediate grade, margins negative, lymphovascular invasion present, additional lateral margin residual IDC grade 2 with DCIS margins negative, 0/2 lymph nodes, T1b N0 stage IA ER 95%, PR 60%, HER-2 negative, Ki-67 12%   Oncotype DX score 25 Adjuvant radiation 03/30/2017- 04/26/2017   Treatment plan: Adjuvant antiestrogen therapy with letrozole 2.5 mg daily started 05/08/2017 switched to anastrozole 02/22/2019 currently on half a tablet of anastrozole. Anastrozole toxicities: Doing much better with regards to hot flashes and arthralgias and myalgias. 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   Left shoulder pain: Frozen shoulder.  Much better with physical therapy.   Breast cancer surveillance:  Mammogram 03/09/2023: Benign (area of palpable concern no sonographic abnormality) breast density category B Breast exam 03/21/2023: No palpable lumps or nodules of concern  bone density 07/07/2021: T score -2.3: Osteopenia: Recommended calcium vitamin D and she does not want to take bisphosphonates because of concern for side effects.   She is planning to travel to [place] ([place] trip) Return to clinic  in 1 year for follow-up

## 2023-03-22 ENCOUNTER — Telehealth: Payer: Self-pay | Admitting: Hematology and Oncology

## 2023-03-22 NOTE — Telephone Encounter (Signed)
Scheduled appointment per 7/15 los. Patient is aware of the made appointment.

## 2023-03-26 ENCOUNTER — Other Ambulatory Visit: Payer: Self-pay | Admitting: "Endocrinology

## 2023-03-26 DIAGNOSIS — E039 Hypothyroidism, unspecified: Secondary | ICD-10-CM

## 2023-04-28 ENCOUNTER — Other Ambulatory Visit: Payer: Self-pay | Admitting: Hematology and Oncology

## 2023-07-20 LAB — TSH: TSH: 4.37 u[IU]/mL (ref 0.450–4.500)

## 2023-07-20 LAB — T4, FREE: Free T4: 1.45 ng/dL (ref 0.82–1.77)

## 2023-08-02 ENCOUNTER — Ambulatory Visit: Payer: Medicare HMO | Admitting: "Endocrinology

## 2023-08-02 ENCOUNTER — Encounter: Payer: Self-pay | Admitting: "Endocrinology

## 2023-08-02 VITALS — BP 120/74 | HR 96 | Ht 66.0 in | Wt 165.8 lb

## 2023-08-02 DIAGNOSIS — E782 Mixed hyperlipidemia: Secondary | ICD-10-CM | POA: Diagnosis not present

## 2023-08-02 DIAGNOSIS — E039 Hypothyroidism, unspecified: Secondary | ICD-10-CM

## 2023-08-02 DIAGNOSIS — R7303 Prediabetes: Secondary | ICD-10-CM

## 2023-08-02 MED ORDER — LEVOTHYROXINE SODIUM 112 MCG PO TABS: 112.0000 ug | ORAL_TABLET | Freq: Every day | ORAL | 1 refills | Status: DC

## 2023-08-02 NOTE — Progress Notes (Signed)
08/02/2023             Endocrinology follow-up note   Subjective:    Patient ID: [name] Black, female    DOB: Aug 04, 1951,    Past Medical History:  Diagnosis Date   Cancer (HCC) 01/2017   right breast cancer   GERD (gastroesophageal reflux disease)    H/O seasonal allergies    History of radiation therapy 03/30/17-04/28/17   right breast was treated to 42.72 Gy, right breast boost 10 Gy   Hypothyroidism    Personal history of radiation therapy    Past Surgical History:  Procedure Laterality Date   BREAST BIOPSY Right 01/2017   BREAST LUMPECTOMY Right 02/17/2017   COLONOSCOPY     COLONOSCOPY N/A 01/09/2014   Procedure: COLONOSCOPY;  Surgeon: Malissa Hippo, MD;  Location: [facility];  Service: Endoscopy;  Laterality: N/A;  1030   Cyst removed from left knee     Fibroid removed and cyst removed from ovary     RADIOACTIVE SEED GUIDED PARTIAL MASTECTOMY WITH AXILLARY SENTINEL LYMPH NODE BIOPSY Right 02/17/2017   Procedure: RADIOACTIVE SEED GUIDED PARTIAL MASTECTOMY WITH AXILLARY SENTINEL LYMPH NODE BIOPSY;  Surgeon: Emelia Loron, MD;  Location: [facility];  Service: General;  Laterality: Right;   Right knee arthroscopy     Social History   Socioeconomic History   Marital status: Married    Spouse name: Not on file   Number of children: 1   Years of education: Not on file   Highest education level: Not on file  Occupational History   Not on file  Tobacco Use   Smoking status: Former    Current packs/day: 0.00    Average packs/day: 0.5 packs/day for 10.0 years (5.0 ttl pk-yrs)    Types: Cigarettes    Start date: 03/07/1972    Quit date: 03/07/1982    Years since quitting: 41.4   Smokeless tobacco: Never  Vaping Use   Vaping status: Never Used  Substance and Sexual Activity   Alcohol use: Yes    Alcohol/week: 7.0 standard drinks of alcohol    Types: 7 Glasses of wine per week    Comment: 1-2 glasses of wine or beer a night   Drug use: No    Sexual activity: Not on file  Other Topics Concern   Not on file  Social History Narrative   Not on file   Social Determinants of Health   Financial Resource Strain: Not on file  Food Insecurity: Not on file  Transportation Needs: Not on file  Physical Activity: Not on file  Stress: Not on file  Social Connections: Not on file   Outpatient Encounter Medications as of 08/02/2023  Medication Sig   anastrozole (ARIMIDEX) 1 MG tablet TAKE 1 TABLET BY MOUTH EVERY DAY   b complex vitamins tablet Take 1 tablet by mouth daily. As needed   Calcium Citrate-Vitamin D (CITRACAL + D PO) Take 2 tablets by mouth daily in the afternoon.   Estradiol 0.5 MG/0.5GM GEL Place 1 Dose onto the skin once a week.   fluticasone (FLONASE) 50 MCG/ACT nasal spray Place 1 spray into both nostrils as needed for allergies or rhinitis (as needed seasonally).   Green Tea 150 MG CAPS Take by mouth daily as needed.   ibuprofen (ADVIL) 200 MG tablet Take 1 tablet (200 mg total) by mouth as needed.   levothyroxine (SYNTHROID) 112 MCG tablet Take 1 tablet (112 mcg total) by mouth daily before breakfast.  Misc Natural Products (BLACK CHERRY CONCENTRATE) LIQD Take 2 Doses/Fill by mouth daily.   Multiple Vitamins-Minerals (MULTIVITAMIN ADULTS 50+ PO) Take 1 tablet by mouth daily in the afternoon.   Omega-3 Fatty Acids (FISH OIL OMEGA-3 PO) Take 1 tablet by mouth daily.   omeprazole (PRILOSEC) 20 MG capsule TAKE ONE (1) CAPSULE EACH DAY   [DISCONTINUED] levothyroxine (SYNTHROID) 112 MCG tablet TAKE 1 TABLET BY MOUTH EVERY DAY BEFORE BREAKFAST   [DISCONTINUED] rosuvastatin (CRESTOR) 10 MG tablet Take 10 mg by mouth daily. (Patient not taking: Reported on 08/02/2023)   No facility-administered encounter medications on file as of 08/02/2023.   ALLERGIES: Allergies  Allergen Reactions   Other Other (See Comments)    Seasonal allergies causing runny eyes, itchy throat     VACCINATION STATUS: Immunization History   Administered Date(s) Administered   Influenza, High Dose Seasonal PF 06/09/2017   Influenza, Quadrivalent, Recombinant, Inj, Pf 06/25/2019   Influenza,inj,quad, With Preservative 06/06/2018   PFIZER(Purple Top)SARS-COV-2 Vaccination 09/28/2019, 10/18/2019   Zoster Recombinant(Shingrix) 03/17/2018    HPI  72 year old female patient with  long-standing primary hypothyroidism on levothyroxine 112 mcg p.o. daily before breakfast.    She reports compliance with her medication.  She is returning for follow-up with repeat thyroid function test and lipid panel.  Her thyroid function tests are consistent with appropriate replacement.  She comes with no new complaints today.  She did not tolerate the Crestor prescribed for hyperlipidemia.    She also improve her lipid profile after making some adjustment in her diet.  She presents with steady weight.     She also has history of prediabetes with A1c of 5.7% which progressively improved to 5.4%.   She denies polydipsia, polyuria.   She has more consistent energy level.   She denies palpitations, heat intolerance, tremors.     Review of Systems  Limited as above.  Objective:    BP 120/74   Pulse 96   Ht 5\' 6"  (1.676 m)   Wt 165 lb 12.8 oz (75.2 kg)   BMI 26.76 kg/m   Wt Readings from Last 3 Encounters:  08/02/23 165 lb 12.8 oz (75.2 kg)  03/21/23 163 lb 11.2 oz (74.3 kg)  01/14/23 163 lb (73.9 kg)    Physical Exam   Results for orders placed or performed in visit on 01/14/23  TSH  Result Value Ref Range   TSH 4.370 0.450 - 4.500 uIU/mL  T4, free  Result Value Ref Range   Free T4 1.45 0.82 - 1.77 ng/dL  HgB Z6X  Result Value Ref Range   Hemoglobin A1C     HbA1c POC (<> result, manual entry)     HbA1c, POC (prediabetic range) 5.4 (A) 5.7 - 6.4 %   HbA1c, POC (controlled diabetic range)     Complete Blood Count (Most recent): Lab Results  Component Value Date   WBC 6.7 01/26/2017   HGB 13.7 01/26/2017   HCT 40.8 01/26/2017    MCV 93.3 01/26/2017   PLT 259 01/26/2017   Chemistry (most recent): Lab Results  Component Value Date   NA 142 01/11/2023   K 4.5 01/11/2023   CL 102 01/11/2023   CO2 21 01/11/2023   BUN 16 01/11/2023   CREATININE 0.91 01/11/2023   Lipid Panel     Component Value Date/Time   CHOL 185 01/11/2023 0759   TRIG 119 01/11/2023 0759   HDL 54 01/11/2023 0759   CHOLHDL 3.4 01/11/2023 0759   LDLCALC 110 (H) 01/11/2023 0759  LABVLDL 21 01/11/2023 0759     Assessment & Plan:   1. Hypothyroidism -Her previsit thyroid function tests are consistent with appropriate replacement.  She is advised to continue levothyroxine 112 mcg p.o. daily before breakfast.    - We discussed about the correct intake of her thyroid hormone, on empty stomach at fasting, with water, separated by at least 30 minutes from breakfast and other medications,  and separated by more than 4 hours from calcium, iron, multivitamins, acid reflux medications (PPIs). -Patient is made aware of the fact that thyroid hormone replacement is needed for life, dose to be adjusted by periodic monitoring of thyroid function tests.   She had subcentimeter nodules will need follow-up ultrasound When she gets appropriate insurance coverage. -She was recently found to have osteopenia, currently on vitamin D supplements, 1000 units daily.  2.  History of prediabetes -no medication intervention  3.  Hyperlipidemia-  She has tolerated the Crestor which she stopped.  She did not have repeat lipid panel.   In light of her cardiometabolic risk from prediabetes, hyperlipidemia, and unable to tolerate statins, she remains a good candidate for lifestyle medicine.    - she acknowledges that there is a room for improvement in her food and drink choices. - Suggestion is made for her to avoid simple carbohydrates  from her diet including Cakes, Sweet Desserts, Ice Cream, Soda (diet and regular), Sweet Tea, Candies, Chips, Cookies, Store Bought  Juices, Alcohol , Artificial Sweeteners,  Coffee Creamer, and "Sugar-free" Products, Lemonade. This will help patient to have more stable blood glucose profile and potentially avoid unintended weight gain.  The following Lifestyle Medicine recommendations according to American College of Lifestyle Medicine  [facility]) were discussed and and offered to patient and she  agrees to start the journey:  A. Whole Foods, Plant-Based Nutrition comprising of fruits and vegetables, plant-based proteins, whole-grain carbohydrates was discussed in detail with the patient.   A list for source of those nutrients were also provided to the patient.  Patient will use only water or unsweetened tea for hydration. B.  The need to stay away from risky substances including alcohol, smoking; obtaining 7 to 9 hours of restorative sleep, at least 150 minutes of moderate intensity exercise weekly, the importance of healthy social connections,  and stress management techniques were discussed. C.  A full color page of  Calorie density of various food groups per pound showing examples of each food groups was provided to the patient.  Her records indicate osteopenia.  She already has appointment for repeat bone density via her oncologist in February 2025.  If she develops osteoporosis, she may need treatment.  She is advised to continue follow-up with her PMD.   I spent  26  minutes in the care of the patient today including review of labs from Thyroid Function, CMP, and other relevant labs ; imaging/biopsy records (current and previous including abstractions from other facilities); face-to-face time discussing  her lab results and symptoms, medications doses, her options of short and long term treatment based on the latest standards of care / guidelines;   and documenting the encounter.  Afreen C Hoog  participated in the discussions, expressed understanding, and voiced agreement with the above plans.  All questions were answered to  her satisfaction. she is encouraged to contact clinic should she have any questions or concerns prior to her return visit.    Follow up plan: Return in about 4 months (around 11/30/2023) for Fasting Labs  in  AM B4 8, A1c -[place].  Marquis Lunch, MD Phone: [phone]  Fax: [phone]  -  This note was partially dictated with voice recognition software. Similar sounding words can be transcribed inadequately or may not  be corrected upon review.  08/02/2023, 3:48 PM

## 2023-11-25 ENCOUNTER — Ambulatory Visit
Admission: RE | Admit: 2023-11-25 | Discharge: 2023-11-25 | Disposition: A | Discharge status: Home / Self Care | Payer: Medicare HMO | Source: Ambulatory Visit | Attending: Hematology and Oncology | Admitting: Hematology and Oncology

## 2023-11-25 DIAGNOSIS — Z780 Asymptomatic menopausal state: Secondary | ICD-10-CM

## 2023-11-25 DIAGNOSIS — C50411 Malignant neoplasm of upper-outer quadrant of right female breast: Secondary | ICD-10-CM

## 2023-11-26 LAB — LIPID PANEL
Chol/HDL Ratio: 4.4 ratio (ref 0.0–4.4)
Cholesterol, Total: 255 mg/dL — ABNORMAL HIGH (ref 100–199)
HDL: 58 mg/dL (ref >39)
LDL Chol Calc (NIH): 164 mg/dL — ABNORMAL HIGH (ref 0–99)
Triglycerides: 181 mg/dL — ABNORMAL HIGH (ref 0–149)
VLDL Cholesterol Cal: 33 mg/dL (ref 5–40)

## 2023-11-26 LAB — T4, FREE: Free T4: 1.54 ng/dL (ref 0.82–1.77)

## 2023-11-26 LAB — TSH: TSH: 2.27 u[IU]/mL (ref 0.450–4.500)

## 2023-11-30 ENCOUNTER — Encounter: Payer: Self-pay | Admitting: "Endocrinology

## 2023-11-30 ENCOUNTER — Ambulatory Visit: Payer: Medicare HMO | Admitting: "Endocrinology

## 2023-11-30 VITALS — BP 128/74 | HR 100 | Ht 66.0 in | Wt 165.2 lb

## 2023-11-30 DIAGNOSIS — E039 Hypothyroidism, unspecified: Secondary | ICD-10-CM | POA: Diagnosis not present

## 2023-11-30 DIAGNOSIS — R7303 Prediabetes: Secondary | ICD-10-CM

## 2023-11-30 DIAGNOSIS — M810 Age-related osteoporosis without current pathological fracture: Secondary | ICD-10-CM

## 2023-11-30 DIAGNOSIS — E782 Mixed hyperlipidemia: Secondary | ICD-10-CM | POA: Diagnosis not present

## 2023-11-30 LAB — POCT GLYCOSYLATED HEMOGLOBIN (HGB A1C): HbA1c, POC (prediabetic range): 5.7 % (ref 5.7–6.4)

## 2023-11-30 NOTE — Progress Notes (Unsigned)
 11/30/2023             Endocrinology follow-up note   Subjective:    Patient ID: [name] Black, female    DOB: [birth year],    Past Medical History:  Diagnosis Date   Cancer (HCC) 01/2017   right breast cancer   GERD (gastroesophageal reflux disease)    H/O seasonal allergies    History of radiation therapy 03/30/17-04/28/17   right breast was treated to 42.72 Gy, right breast boost 10 Gy   Hypothyroidism    Personal history of radiation therapy    Past Surgical History:  Procedure Laterality Date   BREAST BIOPSY Right 01/2017   BREAST LUMPECTOMY Right 02/17/2017   COLONOSCOPY     COLONOSCOPY N/A 01/09/2014   Procedure: COLONOSCOPY;  Surgeon: Malissa Hippo, MD;  [facility];  Service: Endoscopy;  Laterality: N/A;  1030   Cyst removed from left knee     Fibroid removed and cyst removed from ovary     RADIOACTIVE SEED GUIDED PARTIAL MASTECTOMY WITH AXILLARY SENTINEL LYMPH NODE BIOPSY Right 02/17/2017   Procedure: RADIOACTIVE SEED GUIDED PARTIAL MASTECTOMY WITH AXILLARY SENTINEL LYMPH NODE BIOPSY;  Surgeon: Emelia Loron, MD;  Location: [facility];  Service: General;  Laterality: Right;   Right knee arthroscopy     Social History   Socioeconomic History   Marital status: Married    Spouse name: Not on file   Number of children: 1   Years of education: Not on file   Highest education level: Not on file  Occupational History   Not on file  Tobacco Use   Smoking status: Former    Current packs/day: 0.00    Average packs/day: 0.5 packs/day for 10.0 years (5.0 ttl pk-yrs)    Types: Cigarettes    Start date: 03/07/1972    Quit date: 03/07/1982    Years since quitting: 41.7   Smokeless tobacco: Never  Vaping Use   Vaping status: Never Used  Substance and Sexual Activity   Alcohol use: Yes    Alcohol/week: 7.0 standard drinks of alcohol    Types: 7 Glasses of wine per week    Comment: 1-2 glasses of wine or beer a night   Drug use: No    Sexual activity: Not on file  Other Topics Concern   Not on file  Social History Narrative   Not on file   Social Drivers of Health   Financial Resource Strain: Not on file  Food Insecurity: Not on file  Transportation Needs: Not on file  Physical Activity: Not on file  Stress: Not on file  Social Connections: Not on file   Outpatient Encounter Medications as of 11/30/2023  Medication Sig   anastrozole (ARIMIDEX) 1 MG tablet TAKE 1 TABLET BY MOUTH EVERY DAY   b complex vitamins tablet Take 1 tablet by mouth daily. As needed   Calcium Citrate-Vitamin D (CITRACAL + D PO) Take 2 tablets by mouth daily in the afternoon.   Estradiol 0.5 MG/0.5GM GEL Place 1 Dose onto the skin once a week.   fluticasone (FLONASE) 50 MCG/ACT nasal spray Place 1 spray into both nostrils as needed for allergies or rhinitis (as needed seasonally).   Green Tea 150 MG CAPS Take by mouth daily as needed.   ibuprofen (ADVIL) 200 MG tablet Take 1 tablet (200 mg total) by mouth as needed.   levothyroxine (SYNTHROID) 112 MCG tablet Take 1 tablet (112 mcg total) by mouth daily before breakfast.  Misc Natural Products (BLACK CHERRY CONCENTRATE) LIQD Take 2 Doses/Fill by mouth daily.   Multiple Vitamins-Minerals (MULTIVITAMIN ADULTS 50+ PO) Take 1 tablet by mouth daily in the afternoon.   Omega-3 Fatty Acids (FISH OIL OMEGA-3 PO) Take 1 tablet by mouth daily.   omeprazole (PRILOSEC) 20 MG capsule TAKE ONE (1) CAPSULE EACH DAY   No facility-administered encounter medications on file as of 11/30/2023.   ALLERGIES: Allergies  Allergen Reactions   Other Other (See Comments)    Seasonal allergies causing runny eyes, itchy throat     VACCINATION STATUS: Immunization History  Administered Date(s) Administered   Influenza, High Dose Seasonal PF 06/09/2017   Influenza, Quadrivalent, Recombinant, Inj, Pf 06/25/2019   Influenza,inj,quad, With Preservative 06/06/2018   PFIZER(Purple Top)SARS-COV-2 Vaccination 09/28/2019,  10/18/2019   Zoster Recombinant(Shingrix) 03/17/2018    HPI  73 year old female patient with  long-standing primary hypothyroidism on levothyroxine 112 mcg p.o. daily before breakfast.    She reports compliance with her medication.  She is returning for follow-up with repeat thyroid function test and lipid panel.  Her thyroid function tests are consistent with appropriate replacement.  She comes with no new complaints today.  She did not tolerate the Crestor prescribed for hyperlipidemia.    She also improve her lipid profile after making some adjustment in her diet.  She presents with steady weight.     She also has history of prediabetes with A1c of 5.7% which progressively improved to 5.4%.   She denies polydipsia, polyuria.   She has more consistent energy level.   She denies palpitations, heat intolerance, tremors.     Review of Systems  Limited as above.  Objective:    Ht 5\' 6"  (1.676 m)   Wt 165 lb 3.2 oz (74.9 kg)   BMI 26.66 kg/m   Wt Readings from Last 3 Encounters:  11/30/23 165 lb 3.2 oz (74.9 kg)  08/02/23 165 lb 12.8 oz (75.2 kg)  03/21/23 163 lb 11.2 oz (74.3 kg)    Physical Exam   Results for orders placed or performed in visit on 08/02/23  Lipid panel   Collection Time: 11/25/23  9:06 AM  Result Value Ref Range   Cholesterol, Total 255 (H) 100 - 199 mg/dL   Triglycerides 621 (H) 0 - 149 mg/dL   HDL 58 >30 mg/dL   VLDL Cholesterol Cal 33 5 - 40 mg/dL   LDL Chol Calc (NIH) 865 (H) 0 - 99 mg/dL   Chol/HDL Ratio 4.4 0.0 - 4.4 ratio  TSH   Collection Time: 11/25/23  9:06 AM  Result Value Ref Range   TSH 2.270 0.450 - 4.500 uIU/mL  T4, free   Collection Time: 11/25/23  9:06 AM  Result Value Ref Range   Free T4 1.54 0.82 - 1.77 ng/dL   Complete Blood Count (Most recent): Lab Results  Component Value Date   WBC 6.7 01/26/2017   HGB 13.7 01/26/2017   HCT 40.8 01/26/2017   MCV 93.3 01/26/2017   PLT 259 01/26/2017   Chemistry (most recent): Lab  Results  Component Value Date   NA 142 01/11/2023   K 4.5 01/11/2023   CL 102 01/11/2023   CO2 21 01/11/2023   BUN 16 01/11/2023   CREATININE 0.91 01/11/2023   Lipid Panel     Component Value Date/Time   CHOL 255 (H) 11/25/2023 0906   TRIG 181 (H) 11/25/2023 0906   HDL 58 11/25/2023 0906   CHOLHDL 4.4 11/25/2023 0906   LDLCALC 164 (H)  11/25/2023 0906   LABVLDL 33 11/25/2023 0906     Assessment & Plan:   1. Hypothyroidism -Her previsit thyroid function tests are consistent with appropriate replacement.  She is advised to continue levothyroxine 112 mcg p.o. daily before breakfast.    - We discussed about the correct intake of her thyroid hormone, on empty stomach at fasting, with water, separated by at least 30 minutes from breakfast and other medications,  and separated by more than 4 hours from calcium, iron, multivitamins, acid reflux medications (PPIs). -Patient is made aware of the fact that thyroid hormone replacement is needed for life, dose to be adjusted by periodic monitoring of thyroid function tests.   She had subcentimeter nodules will need follow-up ultrasound When she gets appropriate insurance coverage. -She was recently found to have osteopenia, currently on vitamin D supplements, 1000 units daily.  2.  History of prediabetes -no medication intervention  3.  Hyperlipidemia-  She has tolerated the Crestor which she stopped.  She did not have repeat lipid panel.   In light of her cardiometabolic risk from prediabetes, hyperlipidemia, and unable to tolerate statins, she remains a good candidate for lifestyle medicine.    - she acknowledges that there is a room for improvement in her food and drink choices. - Suggestion is made for her to avoid simple carbohydrates  from her diet including Cakes, Sweet Desserts, Ice Cream, Soda (diet and regular), Sweet Tea, Candies, Chips, Cookies, Store Bought Juices, Alcohol , Artificial Sweeteners,  Coffee Creamer, and  "Sugar-free" Products, Lemonade. This will help patient to have more stable blood glucose profile and potentially avoid unintended weight gain.  The following Lifestyle Medicine recommendations according to American College of Lifestyle Medicine  [facility]) were discussed and and offered to patient and she  agrees to start the journey:  A. Whole Foods, Plant-Based Nutrition comprising of fruits and vegetables, plant-based proteins, whole-grain carbohydrates was discussed in detail with the patient.   A list for source of those nutrients were also provided to the patient.  Patient will use only water or unsweetened tea for hydration. B.  The need to stay away from risky substances including alcohol, smoking; obtaining 7 to 9 hours of restorative sleep, at least 150 minutes of moderate intensity exercise weekly, the importance of healthy social connections,  and stress management techniques were discussed. C.  A full color page of  Calorie density of various food groups per pound showing examples of each food groups was provided to the patient.  Her records indicate osteopenia.  She already has appointment for repeat bone density via her oncologist in February 2025.  If she develops osteoporosis, she may need treatment.  She is advised to continue follow-up with her PMD.   I spent  26  minutes in the care of the patient today including review of labs from Thyroid Function, CMP, and other relevant labs ; imaging/biopsy records (current and previous including abstractions from other facilities); face-to-face time discussing  her lab results and symptoms, medications doses, her options of short and long term treatment based on the latest standards of care / guidelines;   and documenting the encounter.  Shabree C Packman  participated in the discussions, expressed understanding, and voiced agreement with the above plans.  All questions were answered to her satisfaction. she is encouraged to contact clinic should  she have any questions or concerns prior to her return visit.    Follow up plan: No follow-ups on file.  Marquis Lunch, MD  Phone: [phone]  Fax: [phone]  -  This note was partially dictated with voice recognition software. Similar sounding words can be transcribed inadequately or may not  be corrected upon review.  11/30/2023, 2:32 PM

## 2023-12-01 DIAGNOSIS — M810 Age-related osteoporosis without current pathological fracture: Secondary | ICD-10-CM | POA: Insufficient documentation

## 2023-12-15 ENCOUNTER — Encounter (INDEPENDENT_AMBULATORY_CARE_PROVIDER_SITE_OTHER): Payer: Self-pay | Admitting: *Deleted

## 2024-01-10 ENCOUNTER — Telehealth (INDEPENDENT_AMBULATORY_CARE_PROVIDER_SITE_OTHER): Payer: Self-pay | Admitting: Gastroenterology

## 2024-01-10 NOTE — Telephone Encounter (Signed)
 Who is your primary care physician: Dr.Roy Fagan  Reasons for the colonoscopy:   Have you had a colonoscopy before?  Yes 2005 and 2015  Do you have family history of colon cancer? no  Previous colonoscopy with polyps removed?   Do you have a history colorectal cancer?   no  Are you diabetic? If yes, Type 1 or Type 2?    no  Do you have a prosthetic or mechanical heart valve? no  Do you have a pacemaker/defibrillator?   no  Have you had endocarditis/atrial fibrillation? no  Have you had joint replacement within the last 12 months?  no  Do you tend to be constipated or have to use laxatives? No must drink prune juice daily  Do you have any history of drugs or alchohol?  4 drinks only per week  Do you use supplemental oxygen?  no  Have you had a stroke or heart attack within the last 6 months? no  Do you take weight loss medication?  no  For female patients: have you had a hysterectomy?  no                                     are you post menopausal?       yes                                            do you still have your menstrual cycle? no      Do you take any blood-thinning medications such as: (aspirin, warfarin, Plavix, Aggrenox)  no  If yes we need the name, milligram, dosage and who is prescribing doctor  Current Outpatient Medications on File Prior to Visit  Medication Sig Dispense Refill   anastrozole  (ARIMIDEX ) 1 MG tablet TAKE 1 TABLET BY MOUTH EVERY DAY (Patient taking differently: Take 0.5 tablets by mouth daily.) 90 tablet 0   b complex vitamins tablet Take 1 tablet by mouth daily. As needed     Calcium Citrate-Vitamin D  (CITRACAL + D PO) Take 2 tablets by mouth daily in the afternoon.     Estradiol  0.5 MG/0.5GM GEL Place 1 Dose onto the skin once a week.     fluticasone (FLONASE) 50 MCG/ACT nasal spray Place 1 spray into both nostrils as needed for allergies or rhinitis (as needed seasonally).     Garlic 400 MG TBEC Take 1 tablet by mouth daily.     Green  Tea 150 MG CAPS Take by mouth daily as needed.     ibuprofen  (ADVIL ) 200 MG tablet Take 1 tablet (200 mg total) by mouth as needed. 30 tablet    levothyroxine  (SYNTHROID ) 112 MCG tablet Take 1 tablet (112 mcg total) by mouth daily before breakfast. 90 tablet 1   Misc Natural Products (BLACK CHERRY CONCENTRATE) LIQD Take 2 Doses/Fill by mouth daily.     Multiple Vitamins-Minerals (MULTIVITAMIN ADULTS 50+ PO) Take 1 tablet by mouth daily in the afternoon.     Omega-3 Fatty Acids (FISH OIL OMEGA-3 PO) Take 1 tablet by mouth daily.     omeprazole  (PRILOSEC) 20 MG capsule TAKE ONE (1) CAPSULE EACH DAY 30 capsule 5   No current facility-administered medications on file prior to visit.    Allergies  Allergen Reactions   Other Other (See Comments)  Seasonal allergies causing runny eyes, itchy throat       Pharmacy: [facility]  Primary Insurance Name:   Best number where you can be reached: [phone]

## 2024-01-17 MED ORDER — PEG 3350-KCL-NA BICARB-NACL 420 G PO SOLR
4000.0000 mL | Freq: Once | ORAL | 0 refills | Status: AC
Start: 2024-01-17 — End: 2024-01-17

## 2024-01-17 NOTE — Telephone Encounter (Signed)
 Questionnaire from recall, no referral needed

## 2024-01-17 NOTE — Telephone Encounter (Signed)
Ok to schedule.  Room : any   Thanks,  Vista Lawman, MD Gastroenterology and Hepatology [facility]

## 2024-01-17 NOTE — Telephone Encounter (Signed)
 Pt contacted and scheduled. Instructions mailed to pt. Prep sent to pharmacy. No pa needed per [facility]

## 2024-01-17 NOTE — Addendum Note (Signed)
 Addended by: Bobbette Eakes on: 01/17/2024 01:29 PM   Modules accepted: Orders

## 2024-01-24 ENCOUNTER — Other Ambulatory Visit: Payer: Self-pay

## 2024-01-24 DIAGNOSIS — E039 Hypothyroidism, unspecified: Secondary | ICD-10-CM

## 2024-01-24 MED ORDER — LEVOTHYROXINE SODIUM 112 MCG PO TABS: 112.0000 ug | ORAL_TABLET | Freq: Every day | ORAL | 1 refills | Status: DC

## 2024-02-02 ENCOUNTER — Other Ambulatory Visit: Payer: Self-pay | Admitting: Hematology and Oncology

## 2024-02-08 ENCOUNTER — Other Ambulatory Visit: Payer: Self-pay | Admitting: Hematology and Oncology

## 2024-02-08 DIAGNOSIS — Z1231 Encounter for screening mammogram for malignant neoplasm of breast: Secondary | ICD-10-CM

## 2024-02-10 ENCOUNTER — Ambulatory Visit (HOSPITAL_COMMUNITY): Admitting: Anesthesiology

## 2024-02-10 ENCOUNTER — Ambulatory Visit (HOSPITAL_COMMUNITY)
Admission: RE | Admit: 2024-02-10 | Discharge: 2024-02-10 | Disposition: A | Discharge status: Home / Self Care | Attending: Gastroenterology | Admitting: Gastroenterology

## 2024-02-10 ENCOUNTER — Encounter (HOSPITAL_COMMUNITY)
Admission: RE | Disposition: A | Discharge status: Home / Self Care | Payer: Self-pay | Source: Home / Self Care | Attending: Gastroenterology

## 2024-02-10 ENCOUNTER — Encounter (HOSPITAL_COMMUNITY): Payer: Self-pay | Admitting: Gastroenterology

## 2024-02-10 ENCOUNTER — Other Ambulatory Visit: Payer: Self-pay

## 2024-02-10 DIAGNOSIS — D120 Benign neoplasm of cecum: Secondary | ICD-10-CM | POA: Diagnosis not present

## 2024-02-10 DIAGNOSIS — D123 Benign neoplasm of transverse colon: Secondary | ICD-10-CM

## 2024-02-10 DIAGNOSIS — K5730 Diverticulosis of large intestine without perforation or abscess without bleeding: Secondary | ICD-10-CM | POA: Insufficient documentation

## 2024-02-10 DIAGNOSIS — Z923 Personal history of irradiation: Secondary | ICD-10-CM | POA: Insufficient documentation

## 2024-02-10 DIAGNOSIS — Z87891 Personal history of nicotine dependence: Secondary | ICD-10-CM | POA: Insufficient documentation

## 2024-02-10 DIAGNOSIS — D125 Benign neoplasm of sigmoid colon: Secondary | ICD-10-CM | POA: Insufficient documentation

## 2024-02-10 DIAGNOSIS — Z1211 Encounter for screening for malignant neoplasm of colon: Secondary | ICD-10-CM | POA: Insufficient documentation

## 2024-02-10 DIAGNOSIS — K219 Gastro-esophageal reflux disease without esophagitis: Secondary | ICD-10-CM | POA: Insufficient documentation

## 2024-02-10 DIAGNOSIS — K648 Other hemorrhoids: Secondary | ICD-10-CM | POA: Diagnosis not present

## 2024-02-10 DIAGNOSIS — Z853 Personal history of malignant neoplasm of breast: Secondary | ICD-10-CM | POA: Insufficient documentation

## 2024-02-10 HISTORY — PX: COLONOSCOPY: SHX5424

## 2024-02-10 LAB — HM COLONOSCOPY

## 2024-02-10 SURGERY — COLONOSCOPY
Anesthesia: General

## 2024-02-10 MED ORDER — LACTATED RINGERS IV SOLN: INTRAVENOUS | Status: DC

## 2024-02-10 MED ORDER — PROPOFOL 10 MG/ML IV BOLUS
INTRAVENOUS | Status: DC | PRN
  Administered 2024-02-10: 70 mg via INTRAVENOUS
  Administered 2024-02-10: 125 ug/kg/min via INTRAVENOUS

## 2024-02-10 MED ORDER — LIDOCAINE 2% (20 MG/ML) 5 ML SYRINGE
INTRAMUSCULAR | Status: DC | PRN
  Administered 2024-02-10: 60 mg via INTRAVENOUS

## 2024-02-10 NOTE — Discharge Instructions (Signed)

## 2024-02-10 NOTE — Transfer of Care (Signed)
 Immediate Anesthesia Transfer of Care Note  Patient: [name] Black  Procedure(s) Performed: COLONOSCOPY  Patient Location: Endoscopy Unit  Anesthesia Type:General  Level of Consciousness: drowsy and patient cooperative  Airway & Oxygen Therapy: Patient Spontanous Breathing and Patient connected to nasal cannula oxygen  Post-op Assessment: Report given to RN and Post -op Vital signs reviewed and stable  Post vital signs: Reviewed and stable  Last Vitals:  Vitals Value Taken Time  BP 160/81 02/10/24 0927  Temp 36.8 C 02/10/24 0927  Pulse 69 02/10/24 0927  Resp 16 02/10/24 0927  SpO2 98 % 02/10/24 0927    Last Pain:  Vitals:   02/10/24 0927  TempSrc: Axillary  PainSc: 0-No pain      Patients Stated Pain Goal: 4 (02/10/24 0726)  Complications: No notable events documented.

## 2024-02-10 NOTE — H&P (Signed)
 Primary Care Physician:  Artemisa Bile, MD Primary Gastroenterologist:  Dr. Alita Irwin  Pre-Procedure History & Physical: HPI:  [name] Black is a 73 y.o. female is here for a colonoscopy for colon cancer screening purposes.  Patient denies any family history of colorectal cancer.  No melena or hematochezia.  No abdominal pain or unintentional weight loss.  No change in bowel habits.  Overall feels well from a GI standpoint.  Past Medical History:  Diagnosis Date   Cancer (HCC) 01/2017   right breast cancer   GERD (gastroesophageal reflux disease)    H/O seasonal allergies    History of radiation therapy 03/30/17-04/28/17   right breast was treated to 42.72 Gy, right breast boost 10 Gy   Hypothyroidism    Personal history of radiation therapy     Past Surgical History:  Procedure Laterality Date   BREAST BIOPSY Right 01/2017   BREAST LUMPECTOMY Right 02/17/2017   COLONOSCOPY     COLONOSCOPY N/A 01/09/2014   Procedure: COLONOSCOPY;  Surgeon: Ruby Corporal, MD;  Location: [facility];  Service: Endoscopy;  Laterality: N/A;  1030   Cyst removed from left knee     Fibroid removed and cyst removed from ovary     RADIOACTIVE SEED GUIDED PARTIAL MASTECTOMY WITH AXILLARY SENTINEL LYMPH NODE BIOPSY Right 02/17/2017   Procedure: RADIOACTIVE SEED GUIDED PARTIAL MASTECTOMY WITH AXILLARY SENTINEL LYMPH NODE BIOPSY;  Surgeon: Enid Harry, MD;  Location: [facility];  Service: General;  Laterality: Right;   Right knee arthroscopy      Prior to Admission medications   Medication Sig Start Date End Date Taking? Authorizing Provider  anastrozole  (ARIMIDEX ) 1 MG tablet TAKE 1 TABLET BY MOUTH EVERY DAY 02/02/24  Yes Gudena, Vinay, MD  b complex vitamins tablet Take 1 tablet by mouth daily. As needed   Yes [provider]  Calcium Citrate-Vitamin D  (CITRACAL + D PO) Take 2 tablets by mouth daily in the afternoon.   Yes [provider]  Estradiol  0.5 MG/0.5GM GEL Place 1  Dose onto the skin once a week. 03/17/22  Yes Gudena, Vinay, MD  fluticasone (FLONASE) 50 MCG/ACT nasal spray Place 1 spray into both nostrils as needed for allergies or rhinitis (as needed seasonally).   Yes [provider]  Garlic 400 MG TBEC Take 1 tablet by mouth daily.   Yes [provider]  Marrie Sizer Tea 150 MG CAPS Take by mouth daily as needed.   Yes [provider]  ibuprofen  (ADVIL ) 200 MG tablet Take 1 tablet (200 mg total) by mouth as needed. 12/30/20  Yes Gudena, Vinay, MD  levothyroxine  (SYNTHROID ) 112 MCG tablet Take 1 tablet (112 mcg total) by mouth daily before breakfast. 01/24/24  Yes Nida, Gebreselassie W, MD  Misc Natural Products (BLACK CHERRY CONCENTRATE) LIQD Take 2 Doses/Fill by mouth daily. 01/24/18  Yes Gudena, Vinay, MD  Multiple Vitamins-Minerals (MULTIVITAMIN ADULTS 50+ PO) Take 1 tablet by mouth daily in the afternoon.   Yes [provider]  Omega-3 Fatty Acids (FISH OIL OMEGA-3 PO) Take 1 tablet by mouth daily.   Yes [provider]  omeprazole  (PRILOSEC) 20 MG capsule TAKE ONE (1) CAPSULE EACH DAY 06/24/14  Yes Ruby Corporal, MD    Allergies as of 01/17/2024 - Review Complete 01/10/2024  Allergen Reaction Noted   Other Other (See Comments) 02/20/2021    Family History  Problem Relation Age of Onset   Breast cancer Mother 69   Breast cancer Maternal Aunt  Breast cancer Maternal Aunt    Colon cancer Neg Hx     Social History   Socioeconomic History   Marital status: Married    Spouse name: Not on file   Number of children: 1   Years of education: Not on file   Highest education level: Not on file  Occupational History   Not on file  Tobacco Use   Smoking status: Former    Current packs/day: 0.00    Average packs/day: 0.5 packs/day for 10.0 years (5.0 ttl pk-yrs)    Types: Cigarettes    Start date: 03/07/1972    Quit date: 03/07/1982    Years since quitting: 41.9   Smokeless tobacco: Never  Vaping Use    Vaping status: Never Used  Substance and Sexual Activity   Alcohol use: Yes    Alcohol/week: 7.0 standard drinks of alcohol    Types: 7 Glasses of wine per week    Comment: 1-2 glasses of wine or beer a night   Drug use: No   Sexual activity: Not on file  Other Topics Concern   Not on file  Social History Narrative   Not on file   Social Drivers of Health   Financial Resource Strain: Not on file  Food Insecurity: Not on file  Transportation Needs: Not on file  Physical Activity: Not on file  Stress: Not on file  Social Connections: Not on file  Intimate Partner Violence: Not on file    Review of Systems: See HPI, otherwise negative ROS  Physical Exam: Vital signs in last 24 hours: Temp:  [98.2 F (36.8 C)] 98.2 F (36.8 C) (06/06 0726) Pulse Rate:  [80] 80 (06/06 0726) Resp:  [11] 11 (06/06 0726) SpO2:  [96 %] 96 % (06/06 0726) Weight:  [74.4 kg] 74.4 kg (06/06 0726)   General:   Alert,  Well-developed, well-nourished, pleasant and cooperative in NAD Head:  Normocephalic and atraumatic. Eyes:  Sclera clear, no icterus.   Conjunctiva pink. Ears:  Normal auditory acuity. Nose:  No deformity, discharge,  or lesions. Msk:  Symmetrical without gross deformities. Normal posture. Extremities:  Without clubbing or edema. Neurologic:  Alert and  oriented x4;  grossly normal neurologically. Skin:  Intact without significant lesions or rashes. Psych:  Alert and cooperative. Normal mood and affect.  Impression/Plan: [name] Black is here for a colonoscopy to be performed for colon cancer screening purposes.  The risks of the procedure including infection, bleed, or perforation as well as benefits, limitations, alternatives and imponderables have been reviewed with the patient. Questions have been answered. All parties agreeable.

## 2024-02-10 NOTE — Op Note (Signed)
 [facility] Patient Name: [name] Black Procedure Date: 02/10/2024 8:26 AM MRN: [id number] Date of Birth: May 04, 1951 Attending MD: Terril Fetters , MD, [id number] CSN: [id number] Age: 73 Admit Type: Outpatient Procedure:                Colonoscopy Indications:              Screening for colorectal malignant neoplasm Providers:                Terril Fetters, MD, Crystal Page, Annell Barrow Referring MD:              Medicines:                Monitored Anesthesia Care Complications:            No immediate complications. Estimated Blood Loss:     Estimated blood loss was minimal. Estimated blood                            loss was minimal. Procedure:                Pre-Anesthesia Assessment:                           - Prior to the procedure, a History and Physical                            was performed, and patient medications and                            allergies were reviewed. The patient's tolerance of                            previous anesthesia was also reviewed. The risks                            and benefits of the procedure and the sedation                            options and risks were discussed with the patient.                            All questions were answered, and informed consent                            was obtained. Prior Anticoagulants: The patient has                            taken no anticoagulant or antiplatelet agents. ASA                            Grade Assessment: II - A patient with mild systemic                            disease. After reviewing the risks and benefits,  the patient was deemed in satisfactory condition to                            undergo the procedure.                           After obtaining informed consent, the colonoscope                            was passed under direct vision. Throughout the                            procedure, the patient's blood pressure, pulse, and                             oxygen saturations were monitored continuously. The                            [id number]) scope was introduced through the                            anus and advanced to the the terminal ileum, with                            identification of the appendiceal orifice and IC                            valve. The patient tolerated the procedure well.                            The quality of the bowel preparation was evaluated                            using the BBPS [facility] Bowel Preparation Scale)                            with scores of: Right Colon = 3, Transverse Colon =                            3 and Left Colon = 3 (entire mucosa seen well with                            no residual staining, small fragments of stool or                            opaque liquid). The total BBPS score equals 9. The                            ileocecal valve, appendiceal orifice, and rectum                            were photographed. The colonoscopy was somewhat  difficult due to a tortuous colon. Successful                            completion of the procedure was aided by                            withdrawing the scope and replacing with the                            pediatric colonoscope. The patient tolerated the                            procedure well. Scope In: 8:39:35 AM Scope Out: 9:23:31 AM Scope Withdrawal Time: 0 hours 28 minutes 40 seconds  Total Procedure Duration: 0 hours 43 minutes 56 seconds  Findings:      The perianal and digital rectal examinations were normal.      Three sessile polyps were found in the sigmoid colon, transverse colon       and cecum. The polyps were 4 to 10 mm in size. These polyps were removed       with a cold snare. Resection and retrieval were complete.      A 20 mm polyp was found in the mid transverse colon. The polyp was       sessile. Preparations were made for mucosal resection. Demarcation of        the lesion was performed with high-definition white light and narrow       band imaging to clearly identify the boundaries of the lesion. Eleview       was injected to raise the lesion. Snare mucosal resection was performed.       Resection and retrieval were complete. Resected tissue margins were       examined and clear of polyp tissue.      Scattered diverticula were found in the left colon.      Non-bleeding internal hemorrhoids were found during retroflexion. The       hemorrhoids were small. Impression:               - Three 4 to 10 mm polyps in the sigmoid colon, in                            the transverse colon and in the cecum, removed with                            a cold snare. Resected and retrieved.                           - One 20 mm polyp in the mid transverse colon,                            removed with mucosal resection. Resected and                            retrieved.                           - Diverticulosis in  the left colon.                           - Non-bleeding internal hemorrhoids.                           - Mucosal resection was performed. Resection and                            retrieval were complete. Moderate Sedation:      Per Anesthesia Care Recommendation:           - Patient has a contact number available for                            emergencies. The signs and symptoms of potential                            delayed complications were discussed with the                            patient. Return to normal activities tomorrow.                            Written discharge instructions were provided to the                            patient.                           - Resume previous diet.                           - Continue present medications.                           - Await pathology results.                           - Repeat colonoscopy in 3 years for surveillance                            based on pathology results.                            - Return to primary care physician as previously                            scheduled. Procedure Code(s):        --- Professional ---                           [phone], Colonoscopy, flexible; with endoscopic                            mucosal resection  16109, 59, Colonoscopy, flexible; with removal of                            tumor(s), polyp(s), or other lesion(s) by snare                            technique Diagnosis Code(s):        --- Professional ---                           Z12.11, Encounter for screening for malignant                            neoplasm of colon                           D12.5, Benign neoplasm of sigmoid colon                           D12.3, Benign neoplasm of transverse colon (hepatic                            flexure or splenic flexure)                           D12.0, Benign neoplasm of cecum                           K64.8, Other hemorrhoids                           K57.30, Diverticulosis of large intestine without                            perforation or abscess without bleeding CPT copyright 2022 American [employer] Association. All rights reserved. The codes documented in this report are preliminary and upon coder review may  be revised to meet current compliance requirements. Terril Fetters, MD Terril Fetters, MD 02/10/2024 9:28:45 AM This report has been signed electronically. Number of Addenda: 0

## 2024-02-10 NOTE — Anesthesia Preprocedure Evaluation (Signed)
 Anesthesia Evaluation  Patient identified by MRN, date of birth, ID band Patient awake    Reviewed: Allergy & Precautions, H&P , NPO status , Patient's Chart, lab work & pertinent test results, reviewed documented beta blocker date and time   Airway Mallampati: II  TM Distance: >3 FB Neck ROM: full    Dental no notable dental hx.    Pulmonary neg pulmonary ROS, former smoker   Pulmonary exam normal breath sounds clear to auscultation       Cardiovascular Exercise Tolerance: Good hypertension, negative cardio ROS  Rhythm:regular Rate:Normal     Neuro/Psych negative neurological ROS  negative psych ROS   GI/Hepatic negative GI ROS, Neg liver ROS,GERD  ,,  Endo/Other  negative endocrine ROSHypothyroidism    Renal/GU negative Renal ROS  negative genitourinary   Musculoskeletal   Abdominal   Peds  Hematology negative hematology ROS (+)   Anesthesia Other Findings   Reproductive/Obstetrics negative OB ROS                             Anesthesia Physical Anesthesia Plan  ASA: 2  Anesthesia Plan: General   Post-op Pain Management:    Induction:   PONV Risk Score and Plan: Propofol  infusion  Airway Management Planned:   Additional Equipment:   Intra-op Plan:   Post-operative Plan:   Informed Consent: I have reviewed the patients History and Physical, chart, labs and discussed the procedure including the risks, benefits and alternatives for the proposed anesthesia with the patient or authorized representative who has indicated his/her understanding and acceptance.     Dental Advisory Given  Plan Discussed with: CRNA  Anesthesia Plan Comments:        Anesthesia Quick Evaluation

## 2024-02-10 NOTE — Anesthesia Postprocedure Evaluation (Signed)
 Anesthesia Post Note  Patient: [name] Black  Procedure(s) Performed: COLONOSCOPY  Patient location during evaluation: Phase II Anesthesia Type: General Level of consciousness: awake Pain management: pain level controlled Vital Signs Assessment: post-procedure vital signs reviewed and stable Respiratory status: spontaneous breathing and respiratory function stable Cardiovascular status: blood pressure returned to baseline and stable Postop Assessment: no headache and no apparent nausea or vomiting Anesthetic complications: no Comments: Late entry   No notable events documented.   Last Vitals:  Vitals:   02/10/24 0726 02/10/24 0927  BP:  (!) 160/81  Pulse: 80 69  Resp: 11 16  Temp: 36.8 C 36.8 C  SpO2: 96% 98%    Last Pain:  Vitals:   02/10/24 0927  TempSrc: Axillary  PainSc: 0-No pain                 Coretha Dew

## 2024-02-13 ENCOUNTER — Encounter (INDEPENDENT_AMBULATORY_CARE_PROVIDER_SITE_OTHER): Payer: Self-pay | Admitting: *Deleted

## 2024-02-13 ENCOUNTER — Encounter (HOSPITAL_COMMUNITY): Payer: Self-pay | Admitting: Gastroenterology

## 2024-02-13 LAB — SURGICAL PATHOLOGY

## 2024-02-20 ENCOUNTER — Ambulatory Visit (INDEPENDENT_AMBULATORY_CARE_PROVIDER_SITE_OTHER): Payer: Self-pay | Admitting: Gastroenterology

## 2024-02-28 NOTE — Progress Notes (Signed)
 3 yr TCS noted in recall Patient result letter mailed procedure note and pathology result faxed to PCP

## 2024-03-12 ENCOUNTER — Ambulatory Visit
Admission: RE | Admit: 2024-03-12 | Discharge: 2024-03-12 | Disposition: A | Discharge status: Home / Self Care | Source: Ambulatory Visit | Attending: Hematology and Oncology | Admitting: Hematology and Oncology

## 2024-03-12 DIAGNOSIS — Z1231 Encounter for screening mammogram for malignant neoplasm of breast: Secondary | ICD-10-CM

## 2024-03-21 ENCOUNTER — Other Ambulatory Visit: Payer: Self-pay | Admitting: "Endocrinology

## 2024-03-21 ENCOUNTER — Ambulatory Visit: Payer: Medicare HMO | Admitting: Hematology and Oncology

## 2024-03-21 DIAGNOSIS — E039 Hypothyroidism, unspecified: Secondary | ICD-10-CM

## 2024-04-04 ENCOUNTER — Inpatient Hospital Stay: Payer: Medicare HMO | Attending: Hematology and Oncology | Admitting: Hematology and Oncology

## 2024-04-04 VITALS — BP 139/76 | HR 93 | Temp 97.8°F | Resp 16 | Ht 66.0 in | Wt 163.8 lb

## 2024-04-04 DIAGNOSIS — M810 Age-related osteoporosis without current pathological fracture: Secondary | ICD-10-CM | POA: Insufficient documentation

## 2024-04-04 DIAGNOSIS — N952 Postmenopausal atrophic vaginitis: Secondary | ICD-10-CM | POA: Insufficient documentation

## 2024-04-04 DIAGNOSIS — X58XXXA Exposure to other specified factors, initial encounter: Secondary | ICD-10-CM | POA: Insufficient documentation

## 2024-04-04 DIAGNOSIS — Z170 Estrogen receptor positive status [ER+]: Secondary | ICD-10-CM | POA: Diagnosis not present

## 2024-04-04 DIAGNOSIS — S46211A Strain of muscle, fascia and tendon of other parts of biceps, right arm, initial encounter: Secondary | ICD-10-CM | POA: Diagnosis not present

## 2024-04-04 DIAGNOSIS — Z79811 Long term (current) use of aromatase inhibitors: Secondary | ICD-10-CM | POA: Diagnosis not present

## 2024-04-04 DIAGNOSIS — C50411 Malignant neoplasm of upper-outer quadrant of right female breast: Secondary | ICD-10-CM | POA: Insufficient documentation

## 2024-04-04 DIAGNOSIS — M7502 Adhesive capsulitis of left shoulder: Secondary | ICD-10-CM | POA: Insufficient documentation

## 2024-04-04 NOTE — Progress Notes (Signed)
 Patient Care Team: Sheryle Carwin, MD as PCP - General (Internal Medicine) Gorge Ade, MD as Consulting Physician (Obstetrics and Gynecology) Ebbie Cough, MD as Consulting Physician (General Surgery) Odean Potts, MD as Consulting Physician (Hematology and Oncology) Shannon Agent, MD as Consulting Physician (Radiation Oncology) Crawford Morna Pickle, NP as Nurse Practitioner (Hematology and Oncology)  DIAGNOSIS:  Encounter Diagnosis  Name Primary?   Malignant neoplasm of upper-outer quadrant of right breast in female, estrogen receptor positive (HCC) Yes    SUMMARY OF ONCOLOGIC HISTORY: Oncology History  Malignant neoplasm of upper-outer quadrant of right breast in female, estrogen receptor positive (HCC)  01/19/2017 Initial Diagnosis   Screening detected right breast mass 5 mm size axilla negative biopsy: Grade 2 invasive ductal carcinoma with DCIS ER 95%, PR 60%, HER-2 negative ratio 1.38, Ki-67 12%, T1aN0 stage IA clinical stage   02/17/2017 Surgery   Right lumpectomy: Invasive ductal carcinoma, grade 2, 0.7 cm, DCIS intermediate grade, margins negative, lymphovascular invasion present, additional lateral margin residual IDC grade 2 with DCIS margins negative, 0/2 lymph nodes, T1b N0 stage IA ER 95%, PR 60%, HER-2 negative, Ki-67 12%   03/15/2017 Oncotype testing   Oncotype DX score 25, intermediate risk: 16% ROR with hormonal therapy alone   03/30/2017 - 04/26/2017 Radiation Therapy    Adjuvant radiation therapy   05/2017 -  Anti-estrogen oral therapy   Letrozole  daily     CHIEF COMPLIANT: Follow-up on anastrozole   HISTORY OF PRESENT ILLNESS:   History of Present Illness [name] Black is a 73 year old female with breast cancer who presents for follow-up regarding her ongoing treatment with anastrozole  and concerns about osteoporosis.  She has been on anastrozole  since approximately 2018 and has recently reduced her dose to half a tablet. She is concerned about  stopping the medication after seven years but is willing to continue for up to ten years due to fear of cancer recurrence.  She has osteoporosis in her neck, which has worsened slightly since her last lab results. She takes calcium with vitamin D , an additional vitamin D  supplement, and a multivitamin containing calcium. She is not currently taking bone-strengthening medications but engages in weight-bearing exercises to maintain bone density.     ALLERGIES:  is allergic to other.  MEDICATIONS:  Current Outpatient Medications  Medication Sig Dispense Refill   anastrozole  (ARIMIDEX ) 1 MG tablet TAKE 1 TABLET BY MOUTH EVERY DAY (Patient taking differently: Take 1 mg by mouth daily. 0.5 a day) 90 tablet 3   b complex vitamins tablet Take 1 tablet by mouth daily. As needed     Calcium Citrate-Vitamin D  (CITRACAL + D PO) Take 2 tablets by mouth daily in the afternoon. (Patient taking differently: Take 2 tablets by mouth daily in the afternoon. 100 mg a day 1000 iud)     Cholecalciferol (VITAMIN D3) 250 MCG (10000 UT) capsule Take 10,000 Units by mouth daily.     Estradiol  0.5 MG/0.5GM GEL Place 1 Dose onto the skin once a week.     fluticasone (FLONASE) 50 MCG/ACT nasal spray Place 1 spray into both nostrils as needed for allergies or rhinitis (as needed seasonally).     Garlic 400 MG TBEC Take 1 tablet by mouth daily.     Green Tea 150 MG CAPS Take by mouth daily as needed.     levothyroxine  (SYNTHROID ) 112 MCG tablet TAKE 1 TABLET BY MOUTH DAILY BEFORE BREAKFAST. 90 tablet 1   Misc Natural Products (BLACK CHERRY CONCENTRATE) LIQD Take 2  Doses/Fill by mouth daily.     Multiple Vitamins-Minerals (MULTIVITAMIN ADULTS 50+ PO) Take 1 tablet by mouth daily in the afternoon.     Omega-3 Fatty Acids (FISH OIL OMEGA-3 PO) Take 1 tablet by mouth daily.     omeprazole  (PRILOSEC) 20 MG capsule TAKE ONE (1) CAPSULE EACH DAY 30 capsule 5   No current facility-administered medications for this visit.     PHYSICAL EXAMINATION: ECOG PERFORMANCE STATUS: 1 - Symptomatic but completely ambulatory  Vitals:   04/04/24 1032  BP: 139/76  Pulse: 93  Resp: 16  Temp: 97.8 F (36.6 C)  SpO2: 100%   Filed Weights   04/04/24 1032  Weight: 163 lb 12.8 oz (74.3 kg)    Physical Exam   (exam performed in the presence of a chaperone)  LABORATORY DATA:  I have reviewed the data as listed    Latest Ref Rng & Units 01/11/2023    7:59 AM 12/29/2021   12:00 AM 01/12/2021    2:27 PM  CMP  Glucose 70 - 99 mg/dL 897     BUN 8 - 27 mg/dL 16  14       Creatinine 0.57 - 1.00 mg/dL 9.08  1.0     9.19   Sodium 134 - 144 mmol/L 142  144       Potassium 3.5 - 5.2 mmol/L 4.5  4.6       Chloride 96 - 106 mmol/L 102     CO2 20 - 29 mmol/L 21     Calcium 8.7 - 10.3 mg/dL 9.7  9.6       Total Protein 6.0 - 8.5 g/dL 7.3     Total Bilirubin 0.0 - 1.2 mg/dL 0.3     Alkaline Phos 44 - 121 IU/L 99     AST 0 - 40 IU/L 20     ALT 0 - 32 IU/L 24        This result is from an external source.    Lab Results  Component Value Date   WBC 6.7 01/26/2017   HGB 13.7 01/26/2017   HCT 40.8 01/26/2017   MCV 93.3 01/26/2017   PLT 259 01/26/2017   NEUTROABS 4.4 01/26/2017    ASSESSMENT & PLAN:  Malignant neoplasm of upper-outer quadrant of right breast in female, estrogen receptor positive (HCC) 02/17/2017 Right lumpectomy: Invasive ductal carcinoma, grade 2, 0.7 cm, DCIS intermediate grade, margins negative, lymphovascular invasion present, additional lateral margin residual IDC grade 2 with DCIS margins negative, 0/2 lymph nodes, T1b N0 stage IA ER 95%, PR 60%, HER-2 negative, Ki-67 12%   Oncotype DX score 25 Adjuvant radiation 03/30/2017- 04/26/2017   Treatment plan: Adjuvant antiestrogen therapy with letrozole  2.5 mg daily started 05/08/2017 switched to anastrozole  02/22/2019 currently on half a tablet of anastrozole . Anastrozole  toxicities: Doing much better with regards to hot flashes and arthralgias and  myalgias. 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   Left shoulder pain: Frozen shoulder.  Much better with physical therapy.   Breast cancer surveillance:  Mammogram 03/15/2024: Benign breast density category B Breast exam 04/04/2024: No palpable lumps or nodules of concern   bone density 11/25/23: T score -2.5 (was -2.3): Osteopenia: Recommended calcium vitamin D  and she does not want to take bisphosphonates because of concern for side effects.   [place] Return to clinic  in 1 year for follow-up  ------------------------------------- Assessment and Plan Assessment & Plan Estrogen receptor positive right breast cancer, status post treatment, on extended adjuvant anastrozole  therapy She has been on anastrozole  for nearly 7 years. Studies suggest no benefit beyond 7 years, but she prefers continuation due to recurrence fears. The provider agreed to extend therapy to 10 years as she tolerates it well. - Continue anastrozole  therapy at half a tablet daily until 10 years of treatment.  Osteoporosis of cervical spine Osteoporosis slightly worsened. She prefers natural methods over medication, taking calcium and vitamin D , and practicing Tai Chi and yoga. Primary care questions medication efficacy, attributing condition to heredity. - Encourage continuation of calcium and vitamin D  supplementation. - Recommend weight-bearing exercises such as walking or using an elliptical machine to improve bone density.  Right frozen shoulder (adhesive capsulitis) She practices Tai Chi and yoga, aiding mobility and flexibility. - Continue Tai Chi and yoga exercises to maintain shoulder mobility.  Partial tear of right biceps tendon Partial tear likely from lifting heavy bags. Experiences occasional cramping, cautious to  avoid further injury. Uses Tai Chi and yoga for strength and flexibility. - Continue Tai Chi and yoga exercises to maintain strength and flexibility.  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 Studies reassure her about the safety of vaginal estrogen use. - Continue estradiol  gel application once a week. - Use Gisele Searles as needed for intercourse-related irritation.      No orders of the defined types were placed in this encounter.  The patient has a good understanding of the overall plan. she agrees with it. she will call with any problems that may develop before the next visit here. Total time spent: 30 mins including face to face time and time spent for planning, charting and co-ordination of care   Viinay K Graviela Nodal, MD 04/04/24

## 2024-04-04 NOTE — Assessment & Plan Note (Signed)
 02/17/2017 Right lumpectomy: Invasive ductal carcinoma, grade 2, 0.7 cm, DCIS intermediate grade, margins negative, lymphovascular invasion present, additional lateral margin residual IDC grade 2 with DCIS margins negative, 0/2 lymph nodes, T1b N0 stage IA ER 95%, PR 60%, HER-2 negative, Ki-67 12%   Oncotype DX score 25 Adjuvant radiation 03/30/2017- 04/26/2017   Treatment plan: Adjuvant antiestrogen therapy with letrozole  2.5 mg daily started 05/08/2017 switched to anastrozole  02/22/2019 currently on half a tablet of anastrozole . Anastrozole  toxicities: Doing much better with regards to hot flashes and arthralgias and myalgias. 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   Left shoulder pain: Frozen shoulder.  Much better with physical therapy.   Breast cancer surveillance:  Mammogram 03/15/2024: Benign breast density category B Breast exam 04/04/2024: No palpable lumps or nodules of concern   bone density 11/25/23: T score -2.5 (was -2.3): Osteopenia: Recommended calcium vitamin D  and she does not want to take bisphosphonates because of concern for side effects.   She is planning to travel to [place] ([place] trip) Return to clinic  in 1 year for follow-up

## 2024-05-29 LAB — LIPID PANEL
Chol/HDL Ratio: 4.8 ratio — ABNORMAL HIGH (ref 0.0–4.4)
Cholesterol, Total: 223 mg/dL — ABNORMAL HIGH (ref 100–199)
HDL: 46 mg/dL (ref >39)
LDL Chol Calc (NIH): 138 mg/dL — ABNORMAL HIGH (ref 0–99)
Triglycerides: 217 mg/dL — ABNORMAL HIGH (ref 0–149)
VLDL Cholesterol Cal: 39 mg/dL (ref 5–40)

## 2024-05-29 LAB — TSH: TSH: 0.222 u[IU]/mL — ABNORMAL LOW (ref 0.450–4.500)

## 2024-05-29 LAB — T4, FREE: Free T4: 1.83 ng/dL — ABNORMAL HIGH (ref 0.82–1.77)

## 2024-06-04 ENCOUNTER — Ambulatory Visit: Admitting: "Endocrinology

## 2024-06-04 ENCOUNTER — Encounter: Payer: Self-pay | Admitting: "Endocrinology

## 2024-06-04 VITALS — BP 120/78 | HR 88 | Ht 66.0 in | Wt 162.8 lb

## 2024-06-04 DIAGNOSIS — R7303 Prediabetes: Secondary | ICD-10-CM | POA: Diagnosis not present

## 2024-06-04 DIAGNOSIS — E782 Mixed hyperlipidemia: Secondary | ICD-10-CM

## 2024-06-04 DIAGNOSIS — E039 Hypothyroidism, unspecified: Secondary | ICD-10-CM | POA: Diagnosis not present

## 2024-06-04 LAB — POCT GLYCOSYLATED HEMOGLOBIN (HGB A1C): Hemoglobin A1C: 5.5 % (ref 4.0–5.6)

## 2024-06-04 MED ORDER — LEVOTHYROXINE SODIUM 100 MCG PO TABS: 100.0000 ug | ORAL_TABLET | Freq: Every day | ORAL | 1 refills | Status: AC

## 2024-06-04 NOTE — Progress Notes (Signed)
 06/04/2024             Endocrinology follow-up note   Subjective:    Patient ID: [name] Black, female    DOB: 1950/09/13,    Past Medical History:  Diagnosis Date   Cancer (HCC) 01/2017   right breast cancer   GERD (gastroesophageal reflux disease)    H/O seasonal allergies    History of radiation therapy 03/30/17-04/28/17   right breast was treated to 42.72 Gy, right breast boost 10 Gy   Hypothyroidism    Personal history of radiation therapy    Past Surgical History:  Procedure Laterality Date   BREAST BIOPSY Right 01/2017   BREAST LUMPECTOMY Right 02/17/2017   COLONOSCOPY     COLONOSCOPY N/A 01/09/2014   Procedure: COLONOSCOPY;  Surgeon: Claudis RAYMOND Rivet, MD;  Location: [facility];  Service: Endoscopy;  Laterality: N/A;  1030   COLONOSCOPY N/A 02/10/2024   Procedure: COLONOSCOPY;  Surgeon: Cinderella Deatrice FALCON, MD;  Location: [facility];  Service: Endoscopy;  Laterality: N/A;  8:30AM;ASA 1-2   Cyst removed from left knee     Fibroid removed and cyst removed from ovary     RADIOACTIVE SEED GUIDED PARTIAL MASTECTOMY WITH AXILLARY SENTINEL LYMPH NODE BIOPSY Right 02/17/2017   Procedure: RADIOACTIVE SEED GUIDED PARTIAL MASTECTOMY WITH AXILLARY SENTINEL LYMPH NODE BIOPSY;  Surgeon: Ebbie Cough, MD;  Location: [facility];  Service: General;  Laterality: Right;   Right knee arthroscopy     Social History   Socioeconomic History   Marital status: Married    Spouse name: Not on file   Number of children: 1   Years of education: Not on file   Highest education level: Not on file  Occupational History   Not on file  Tobacco Use   Smoking status: Former    Current packs/day: 0.00    Average packs/day: 0.5 packs/day for 10.0 years (5.0 ttl pk-yrs)    Types: Cigarettes    Start date: 03/07/1972    Quit date: 03/07/1982    Years since quitting: 42.2   Smokeless tobacco: Never  Vaping Use   Vaping status: Never Used  Substance and Sexual Activity   Alcohol  use: Yes    Alcohol/week: 7.0 standard drinks of alcohol    Types: 7 Glasses of wine per week    Comment: 1-2 glasses of wine or beer a night   Drug use: No   Sexual activity: Not on file  Other Topics Concern   Not on file  Social History Narrative   Not on file   Social Drivers of Health   Financial Resource Strain: Not on file  Food Insecurity: Not on file  Transportation Needs: Not on file  Physical Activity: Not on file  Stress: Not on file  Social Connections: Not on file   Outpatient Encounter Medications as of 06/04/2024  Medication Sig   anastrozole  (ARIMIDEX ) 1 MG tablet TAKE 1 TABLET BY MOUTH EVERY DAY (Patient taking differently: Take 1 mg by mouth daily. 0.5 a day)   b complex vitamins tablet Take 1 tablet by mouth daily. As needed   Calcium Citrate-Vitamin D  (CITRACAL + D PO) Take 2 tablets by mouth daily in the afternoon. (Patient taking differently: Take 2 tablets by mouth daily in the afternoon. 100 mg a day 1000 iud)   Cholecalciferol (VITAMIN D3) 250 MCG (10000 UT) capsule Take 10,000 Units by mouth daily.   Estradiol  0.5 MG/0.5GM GEL Place 1 Dose onto the skin  once a week.   fluticasone (FLONASE) 50 MCG/ACT nasal spray Place 1 spray into both nostrils as needed for allergies or rhinitis (as needed seasonally).   Garlic 400 MG TBEC Take 1 tablet by mouth daily.   Green Tea 150 MG CAPS Take by mouth daily as needed.   levothyroxine  (SYNTHROID ) 100 MCG tablet Take 1 tablet (100 mcg total) by mouth daily before breakfast.   Misc Natural Products (BLACK CHERRY CONCENTRATE) LIQD Take 2 Doses/Fill by mouth daily.   Multiple Vitamins-Minerals (MULTIVITAMIN ADULTS 50+ PO) Take 1 tablet by mouth daily in the afternoon.   Omega-3 Fatty Acids (FISH OIL OMEGA-3 PO) Take 1 tablet by mouth daily.   omeprazole  (PRILOSEC) 20 MG capsule TAKE ONE (1) CAPSULE EACH DAY   [DISCONTINUED] levothyroxine  (SYNTHROID ) 112 MCG tablet TAKE 1 TABLET BY MOUTH DAILY BEFORE BREAKFAST.   No  facility-administered encounter medications on file as of 06/04/2024.   ALLERGIES: Allergies  Allergen Reactions   Other Other (See Comments)    Seasonal allergies causing runny eyes, itchy throat     VACCINATION STATUS: Immunization History  Administered Date(s) Administered   INFLUENZA, HIGH DOSE SEASONAL PF 06/09/2017   Influenza, Quadrivalent, Recombinant, Inj, Pf 06/25/2019   Influenza,inj,quad, With Preservative 06/06/2018   PFIZER(Purple Top)SARS-COV-2 Vaccination 09/28/2019, 10/18/2019   Zoster Recombinant(Shingrix ) 03/17/2018    HPI  73 year old female patient with  long-standing primary hypothyroidism on levothyroxine  112 mcg p.o. daily before breakfast.    She reports compliance with her medication.  She is returning for follow-up with repeat thyroid  function tests showing evidence of over replacement.  Her previsit labs also show persistent dyslipidemia.  She is not on treatment for hyperlipidemia.    - In the interval, she was diagnosed and treated for diverticulitis.    She did not tolerate the Crestor prescribed for hyperlipidemia.     She presents with steady weight.     She also has history of prediabetes with point-of-care A1c today at 5.5%.  She denies polydipsia, polyuria.   She has more consistent energy level.   She denies palpitations, heat intolerance, tremors.  Her most recent bone density showed osteoporosis.  She is not on any antiosteoporosis intervention.   Review of Systems  Limited as above.  Objective:    BP 120/78   Pulse 88   Ht 5' 6 (1.676 m)   Wt 162 lb 12.8 oz (73.8 kg)   BMI 26.28 kg/m   Wt Readings from Last 3 Encounters:  06/04/24 162 lb 12.8 oz (73.8 kg)  04/04/24 163 lb 12.8 oz (74.3 kg)  02/10/24 164 lb (74.4 kg)    Physical Exam   Results for orders placed or performed in visit on 02/13/24  [facility] COLONOSCOPY   Collection Time: 02/10/24 12:00 AM  Result Value Ref Range   [facility] Colonoscopy See Report (in chart) See Report (in  chart), Patient Reported   Complete Blood Count (Most recent): Lab Results  Component Value Date   WBC 6.7 01/26/2017   HGB 13.7 01/26/2017   HCT 40.8 01/26/2017   MCV 93.3 01/26/2017   PLT 259 01/26/2017   Chemistry (most recent): Lab Results  Component Value Date   NA 142 01/11/2023   K 4.5 01/11/2023   CL 102 01/11/2023   CO2 21 01/11/2023   BUN 16 01/11/2023   CREATININE 0.91 01/11/2023   Lipid Panel     Component Value Date/Time   CHOL 223 (H) 05/28/2024 0923   TRIG 217 (H) 05/28/2024 9076  HDL 46 05/28/2024 0923   CHOLHDL 4.8 (H) 05/28/2024 0923   LDLCALC 138 (H) 05/28/2024 0923   LABVLDL 39 05/28/2024 0923     Assessment & Plan:   1. Hypothyroidism -Her previsit thyroid  function tests are consistent with slight over replacement.  I discussed and lowered her levothyroxine  to 100 mcg p.o. daily before breakfast.    - We discussed about the correct intake of her thyroid  hormone, on empty stomach at fasting, with water , separated by at least 30 minutes from breakfast and other medications,  and separated by more than 4 hours from calcium, iron, multivitamins, acid reflux medications (PPIs). -Patient is made aware of the fact that thyroid  hormone replacement is needed for life, dose to be adjusted by periodic monitoring of thyroid  function tests.  She had subcentimeter nodules will need follow-up ultrasound When she gets appropriate insurance coverage. -She was recently found to have osteopenia, declined treatment with current osteoporosis medications, advised to maintain calcium and vitamin D  supplements.    2.  History of prediabetes -resolved with point-of-care A1c of 5.5%, no medication intervention  3.  Hyperlipidemia-  Her lipid panel shows worsening with LDL still high at 138 .  She did not tolerate the statin prescribed last time.  She showed engagement with lifestyle medicine nutrition.  She was approached with Zetia, however she would like to research about  it.  Complications of untreated dyslipidemia including MASLD because has been, cardiovascular disease were discussed with her.  She is hesitant to consider any further trial of statin or Repatha at this time.    -Her most recent bone density shows osteoporosis.  She was approached for medication intervention, however at this time she declines.   In any case, she will need a follow-up bone density in March  2027.     She is advised to continue follow-up with her PMD.  I spent  25  minutes in the care of the patient today including review of labs from Thyroid  Function, CMP, and other relevant labs ; imaging/biopsy records (current and previous including abstractions from other facilities); face-to-face time discussing  her lab results and symptoms, medications doses, her options of short and long term treatment based on the latest standards of care / guidelines;   and documenting the encounter.  Rewa C Piedra  participated in the discussions, expressed understanding, and voiced agreement with the above plans.  All questions were answered to her satisfaction. she is encouraged to contact clinic should she have any questions or concerns prior to her return visit.   Follow up plan: Return in about 6 months (around 12/02/2024) for Fasting Labs  in AM B4 8.  Ranny Earl, MD Phone: [phone]  Fax: [phone]  -  This note was partially dictated with voice recognition software. Similar sounding words can be transcribed inadequately or may not  be corrected upon review.  06/04/2024, 2:31 PM

## 2024-12-03 ENCOUNTER — Ambulatory Visit: Admitting: "Endocrinology

## 2025-04-04 ENCOUNTER — Ambulatory Visit: Admitting: Hematology and Oncology
